# Patient Record
Sex: Female | Born: 1965 | Race: Black or African American | Hispanic: No | Marital: Married | Smoking: Never smoker
Health system: Southern US, Community
[De-identification: ages and names within clinical notes are randomized; demographics above are authoritative.]

## PROBLEM LIST (undated history)

## (undated) DIAGNOSIS — I2510 Atherosclerotic heart disease of native coronary artery without angina pectoris: Secondary | ICD-10-CM

## (undated) DIAGNOSIS — F419 Anxiety disorder, unspecified: Secondary | ICD-10-CM

## (undated) DIAGNOSIS — I10 Essential (primary) hypertension: Secondary | ICD-10-CM

## (undated) DIAGNOSIS — E785 Hyperlipidemia, unspecified: Secondary | ICD-10-CM

## (undated) HISTORY — DX: Anxiety disorder, unspecified: F41.9

## (undated) HISTORY — PX: ENDOMETRIAL ABLATION W/ NOVASURE: SUR434

## (undated) HISTORY — PX: TONSILLECTOMY: SUR1361

## (undated) HISTORY — PX: ABDOMINAL HYSTERECTOMY: SHX81

## (undated) HISTORY — DX: Atherosclerotic heart disease of native coronary artery without angina pectoris: I25.10

## (undated) HISTORY — PX: TUBAL LIGATION: SHX77

## (undated) HISTORY — PX: DIAGNOSTIC LAPAROSCOPY: SUR761

## (undated) HISTORY — DX: Hyperlipidemia, unspecified: E78.5

---

## 1998-03-15 ENCOUNTER — Inpatient Hospital Stay (HOSPITAL_COMMUNITY): Admission: AD | Admit: 1998-03-15 | Discharge: 1998-03-17 | Payer: Self-pay | Admitting: Obstetrics and Gynecology

## 1999-05-18 ENCOUNTER — Other Ambulatory Visit: Admission: RE | Admit: 1999-05-18 | Discharge: 1999-05-18 | Payer: Self-pay | Admitting: Obstetrics and Gynecology

## 2000-07-08 ENCOUNTER — Other Ambulatory Visit: Admission: RE | Admit: 2000-07-08 | Discharge: 2000-07-08 | Payer: Self-pay | Admitting: Obstetrics and Gynecology

## 2001-08-26 ENCOUNTER — Other Ambulatory Visit: Admission: RE | Admit: 2001-08-26 | Discharge: 2001-08-26 | Payer: Self-pay | Admitting: Obstetrics and Gynecology

## 2002-09-08 ENCOUNTER — Other Ambulatory Visit: Admission: RE | Admit: 2002-09-08 | Discharge: 2002-09-08 | Payer: Self-pay | Admitting: Obstetrics and Gynecology

## 2003-12-21 ENCOUNTER — Other Ambulatory Visit: Admission: RE | Admit: 2003-12-21 | Discharge: 2003-12-21 | Payer: Self-pay | Admitting: Obstetrics and Gynecology

## 2004-12-03 HISTORY — PX: COLONOSCOPY: SHX174

## 2004-12-26 ENCOUNTER — Other Ambulatory Visit: Admission: RE | Admit: 2004-12-26 | Discharge: 2004-12-26 | Payer: Self-pay | Admitting: Obstetrics and Gynecology

## 2005-08-10 ENCOUNTER — Ambulatory Visit: Payer: Self-pay | Admitting: Gastroenterology

## 2005-08-28 ENCOUNTER — Ambulatory Visit: Payer: Self-pay | Admitting: Gastroenterology

## 2006-01-25 ENCOUNTER — Other Ambulatory Visit: Admission: RE | Admit: 2006-01-25 | Discharge: 2006-01-25 | Payer: Self-pay | Admitting: Obstetrics and Gynecology

## 2006-03-04 ENCOUNTER — Ambulatory Visit (HOSPITAL_COMMUNITY): Admission: RE | Admit: 2006-03-04 | Discharge: 2006-03-04 | Payer: Self-pay | Admitting: Obstetrics and Gynecology

## 2007-04-09 ENCOUNTER — Ambulatory Visit (HOSPITAL_COMMUNITY): Admission: RE | Admit: 2007-04-09 | Discharge: 2007-04-09 | Payer: Self-pay | Admitting: Obstetrics and Gynecology

## 2007-04-09 ENCOUNTER — Encounter (INDEPENDENT_AMBULATORY_CARE_PROVIDER_SITE_OTHER): Payer: Self-pay | Admitting: Specialist

## 2009-05-26 ENCOUNTER — Encounter: Admission: RE | Admit: 2009-05-26 | Discharge: 2009-05-26 | Payer: Self-pay | Admitting: Obstetrics and Gynecology

## 2010-12-25 ENCOUNTER — Encounter: Payer: Self-pay | Admitting: Obstetrics and Gynecology

## 2011-04-20 NOTE — Op Note (Signed)
NAMEJAKIYAH, STEPNEY                 ACCOUNT NO.:  [id number]   MEDICAL RECORD NO.:  [id number]          PATIENT TYPE:  AMB   LOCATION:  SDC                           FACILITY:  [facility]   PHYSICIAN:  [name], M.D.   DATE OF BIRTH:  03/22/1966   DATE OF PROCEDURE:  03/04/2006  DATE OF DISCHARGE:  03/04/2006                                 OPERATIVE REPORT   PREOPERATIVE DIAGNOSIS:  Pelvic pain.   POSTOPERATIVE DIAGNOSIS:  Pelvic pain.   OPERATIVE PROCEDURE:  Diagnostic laparoscopy.   SURGEON:  [name], M.D.   ANESTHESIA:  General.   ESTIMATED BLOOD LOSS:  Minimal.   PACKS AND DRAINS:  None.   INTRAOPERATIVE BLOOD REPLACED:  None.   COMPLICATIONS:  None.   INDICATIONS:  Dictated in the history and physical.   PROCEDURE:  The patient was taken to the OR and placed in the supine  position.  After a satisfactory level of general anesthesia was obtained,  the patient was placed in the dorsal lithotomy position using the Allen  stirrups.  The abdomen, perineum and vagina were prepped out with Betadine.  The bladder was emptied by in-and-out catheterization.  A Hulka tenaculum  was put in place and secured.  The patient was draped as a sterile field.  A  subumbilical incision was made with a knife, carried through the  subcutaneous tissue.  The fascia was entered sharply and the incision in the  fascia extended laterally.  The peritoneum was entered bluntly and a Taut  open laparoscopic trocar was put in place and secured.  The laparoscope was  introduced and there was no evidence of injury to adjacent organs.  A 5 mm  trocar was put in place under direct visualization.  Visualization revealed  the uterus to be upper limits of normal size and irregular consistent with  known fibroids.  A bilateral tubal ligation was noted.  Ovaries were  unremarkable.  The appendix was visualized and noted to be normal.  The  upper abdomen including the liver and tip of the gallbladder  was clear.  The  cul-de-sac and both lateral gutters were clear.  There was no evidence of  exact cause for the pain unless we are dealing with uterine adenomyosis or  from the fibroid.  At this point in time the abdomen was deflated of its  carbon dioxide, all trocars removed.  The subumbilical fascia closed with  two figure-of-eights of 0 Vicryl, skin with interrupted subcutiuclars of 4-0  Vicryl, the suprapubic  incision closed with Dermabond and the Hulka tenaculum was then removed.  The patient was taken out of the dorsal lithotomy position, once alert and  extubated transferred to the recovery room in good condition.  Sponge,  instrument and needle count reported as correct by the circulating nurse x2.      [name], M.D.  Electronically Signed     JSM/MEDQ  D:  03/04/2006  T:  03/06/2006  Job:  [id number]

## 2011-04-20 NOTE — H&P (Signed)
NAME:  Nancy [name], MUCHA NO.:  [id number]   MEDICAL RECORD NO.:  [id number]          PATIENT TYPE:  AMB   LOCATION:  SDC                           [facility]   PHYSICIAN:  [name], M.D.   DATE OF BIRTH:  03-Apr-1966   DATE OF ADMISSION:  04/09/2007  DATE OF DISCHARGE:                              HISTORY & PHYSICAL   The patient is a 45 year old gravida 2, para 2 married black female who  presents for hysteroscopy, endometrial ablation.   In relation to the present admission the patient's cycles have become  increasingly heavy.  She has three days of markedly heavy flow changing  pads and tampons every 1-2 hours with clots; at times incapacitating.  Hemoglobin has remained stable.  We did a saline infusion ultrasound; it  was unremarkable and highly suggestive of adenomyosis.  We discussed  options including birth control pills, Marine IUD, ablation or  hysterectomy.  The patient now presents for NovaSure ablation.   ALLERGIES:  No known drug allergies.   MEDICATIONS:  None.   PAST MEDICAL HISTORY:  Usual childhood diseases.  No significant  sequelae.   PAST SURGICAL HISTORY:  She had a TAB in 1986, tonsillectomy at age 42,  previous postpartum tubal, previous diagnostic laparoscopy for pelvic  pain.   OBSTETRICAL HISTORY:  Two vaginal deliveries.   FAMILY HISTORY:  Noncontributory.   SOCIAL HISTORY:  There is no tobacco, alcohol use.   REVIEW OF SYSTEMS:  Noncontributory.   PHYSICAL EXAMINATION:  VITAL SIGNS:  Afebrile, stable vital signs.  HEENT: The patient is normocephalic.  Pupils equal, round, and reactive  to light and accommodation.  Extraocular movements were intact.  Sclerae  and conjunctivae are clear.  Oropharynx clear.  NECK:  Without thyromegaly.  BREASTS:  No discrete masses.  LUNGS:  Clear.  CARDIOVASCULAR:  Regular rhythm and rate without murmurs or gallops.  ABDOMEN:  Benign.  No mass, organomegaly or tenderness.  PELVIC:   Normal external genitalia.  Vaginal mucosa is clear.  Cervix  unremarkable.  Uterus normal size, shape, and contour.  Adnexa free of  masses or tenderness.  EXTREMITIES:  Trace edema.  NEUROLOGIC:  Grossly within normal limits.   IMPRESSION:  Menorrhagia secondary to adenomyosis.   PLAN:  The patient to undergo hysteroscopy and NovaSure ablation.  Risks  have been discussed including the risk of infection, risk of vascular  injury that could lead to hemorrhage requiring transfusion with the risk  of AIDS or hepatitis.  Continued bleeding could require hysterectomy.  There is a risk of perforation that could lead to injury to adjacent  organs including bowel that could require further exploratory surgery,  risk of deep venous thrombosis, and pulmonary embolus.  Success rates of  approximately 80% are quoted.  Amenorrhea rates of approximately 40% are  quoted.  The patient expresses understanding of indications, risks, and  alternatives.      [name], M.D.  Electronically Signed     JSM/MEDQ  D:  04/09/2007  T:  04/09/2007  Job:  [id number]

## 2011-04-20 NOTE — Op Note (Signed)
Nancy [name], LANDRY NO.:  [id number]   MEDICAL RECORD NO.:  [id number]          PATIENT TYPE:  AMB   LOCATION:  SDC                           [facility]   PHYSICIAN:  [name], M.D.   DATE OF BIRTH:  Apr 15, 1966   DATE OF PROCEDURE:  04/09/2007  DATE OF DISCHARGE:                               OPERATIVE REPORT   PREOPERATIVE DIAGNOSIS:  Menorrhagia.   POSTOPERATIVE DIAGNOSIS:  Menorrhagia.   OPERATIVE PROCEDURES:  1. Paracervical block.  2. Hysteroscopy.  3. NovaSure ablation of the endometrium.   SURGEON:  [name], M.D.   ANESTHESIA:  General with paracervical block.   ESTIMATED BLOOD LOSS:  Minimal.   PACKS AND DRAINS:  None.   INTRAOPERATIVE BLOOD REPLACED:  None.   COMPLICATIONS:  None.   INDICATIONS:  Dictated in the history and physical.   PROCEDURE:  The patient was taken to the OR and placed in supine  position.  After a satisfactory level of general endotracheal anesthesia  obtained, the patient was placed in the dorsal position using the Allen  stirrups.  Perineum and vagina cleansed out with Betadine and draped as  a sterile field.  A speculum was placed in the vaginal vault.  The  cervix was grasped with a single-tooth tenaculum.  The uterus sounded to  8 cm.  Endocervical length was 3 cm.  Paracervical block of 1% Nesacaine  was then instituted.  The hysteroscope was then introduced, intrauterine  cavity was distended.  Visualization revealed a normal endometrial  cavity.  There was no evidence of polyps or abnormalities.  Endometrial  biopsy was obtained, sent for pathological review.  NovaSure was then  brought into place and properly deployed.  Her total cavity with was  3.1.  We then passed the carbon dioxide patency test and proceeded with  the ablation.  We ablated with a power of 85 for 105 seconds.  The  NovaSure was then removed intact.  We re-hysteroscoped her.  Endometrium  was totally ablated.  There was  no evidence of perforation or other  complications.  Hysteroscope and single-tooth tenaculum were removed,  the patient taken out of the dorsal lithotomy position, once alert and  extubated transferred to the recovery room in good condition.  Sponge,  instrument and needle count reported as correct by the circulating  nurse.      [name], M.D.  Electronically Signed     JSM/MEDQ  D:  04/09/2007  T:  04/09/2007  Job:  [id number]

## 2011-04-20 NOTE — H&P (Signed)
NAME:  Nancy [name], Nancy [name] NO.:  [id number]   MEDICAL RECORD NO.:  [id number]          PATIENT TYPE:  AMB   LOCATION:  SDC                           [facility]   PHYSICIAN:  [name], M.D.   DATE OF BIRTH:  1966/11/22   DATE OF ADMISSION:  DATE OF DISCHARGE:                                HISTORY & PHYSICAL   HISTORY OF PRESENT ILLNESS:  The patient is a 45 year old gravida 2, para 2  female who presents for diagnostic laparoscopy with laser standby.   In relation to the present admission, cycles are regular.  She has issues of  left lower quadrant pain with her cycles. The pain shoots down through her  rectum.  There is no pain with intercourse.  She has no change in bladder or  bowel habits.  Ultrasound has basically been unremarkable.  She has a 4.4 cm  fibroid with area of degeneration.  Endometrium and ovaries, otherwise,  unremarkable.  We have discussed options.  It could be that we are dealing  with endometriosis.  The pain has caused increasing pain and discomfort.  In  view of this, will proceed with diagnostic laparoscopy with laser standby.   ALLERGIES:  No known drug allergies.   MEDICATIONS:  Ultram.   PAST MEDICAL HISTORY:  No childhood diseases.   PAST SURGICAL HISTORY:  1.  She had a TAB in 1986.  2.  Tonsillectomy at age 74.  3.  Postpartum bilateral tubal ligation.   OBSTETRIC HISTORY:  Two vaginal deliveries.   FAMILY HISTORY:  Noncontributory.   SOCIAL HISTORY:  No tobacco or alcohol use.   REVIEW OF SYSTEMS:  Noncontributory.   PHYSICAL EXAMINATION:  GENERAL APPEARANCE:  The patient is afebrile, stable  vital signs.  HEENT:  The patient is normocephalic.  Pupils equal, round and reactive to  light and accommodation.  Extraocular movements intact.  Sclerae and  conjunctivae clear.  Oropharynx clear.  NECK:  Without thyromegaly.  BREASTS:  No discrete masses.  LUNGS:  Clear.  CARDIOVASCULAR:  Regular rate.  No murmurs or  gallops.  ABDOMEN:  Benign.  No masses, organomegaly or tenderness.  PELVIC:  Normal external genitalia.  Vaginal mucosa is clear.  Cervix is  unremarkable.  Uterus normal size, shape and contour.  Adnexa free of masses  or tenderness.  EXTREMITIES:  Trace edema.  NEUROLOGICAL:  Grossly within normal limits. 4   IMPRESSION:  Chronic pelvic pain, rule out endometriosis.   PLAN:  The patient will undergo diagnostic laparoscopy with laser standby.  Risks have been discussed including the risk of infection.  Risk of  hemorrhage could require transfusion, risk of AIDS or hepatitis, risk or  injury to adjacent organs including bladder, bowel or ureters that could  require further exploratory surgery, risk of deep vein thrombosis and  pulmonary embolus.  The patient expressed understanding of indications,  risks and other alternatives.      [name], M.D.  Electronically Signed     JSM/MEDQ  D:  03/03/2006  T:  03/04/2006  Job:  [id number]

## 2012-05-22 ENCOUNTER — Other Ambulatory Visit: Payer: Self-pay | Admitting: Physician Assistant

## 2012-07-30 ENCOUNTER — Ambulatory Visit (INDEPENDENT_AMBULATORY_CARE_PROVIDER_SITE_OTHER): Payer: 59 | Admitting: Family Medicine

## 2012-07-30 VITALS — BP 138/82 | HR 74 | Temp 98.8°F | Resp 20 | Ht 63.0 in | Wt 125.0 lb

## 2012-07-30 DIAGNOSIS — F329 Major depressive disorder, single episode, unspecified: Secondary | ICD-10-CM

## 2012-07-30 DIAGNOSIS — R079 Chest pain, unspecified: Secondary | ICD-10-CM

## 2012-07-30 DIAGNOSIS — R200 Anesthesia of skin: Secondary | ICD-10-CM

## 2012-07-30 DIAGNOSIS — E785 Hyperlipidemia, unspecified: Secondary | ICD-10-CM

## 2012-07-30 DIAGNOSIS — R209 Unspecified disturbances of skin sensation: Secondary | ICD-10-CM

## 2012-07-30 NOTE — Progress Notes (Signed)
[facility] [address] [place] [address] [phone]  Date:  07/30/2012   Name:  Nancy [name]   DOB:  1966-09-09   MRN:  [id number]  PCP:  No primary provider on file.    Chief Complaint: Chest Pain, Shortness of Breath and Numbness   History of Present Illness:  Nancy [name] is a 46 y.o. very pleasant female patient who presents with the following:  Here to evaluate SOB, CP and tingling in her left arm.  She was here in November of 2012 with complaint of SOB, chest discomfort that was associated with bronchitis. She was also started on zoloft at that time.   Here today with "chest discomfort" which she notes with walking, dancing, climbing stairs.  She will feel "as if I just ran a mile" after light exercise.  Will feel SOB/ chest tightness/ out of breath.  This has gone on for at least 2 months- per chart maybe longer. She had a similar issue in January of 2012.  She feels as through it is getting worse. The same amount of exertion will cause her symptoms consistently.   She also noted tingling in her left arm and hand "all the time." She has noticed this for several weeks.  "Popping" the joints in her hand seems to help.  No weakness or symptoms into her fingers.   She has never had a stress test or other cardiac evaluation.  She is active and exercises regularly Never a smoker.   Her father had CABG and other complications, he had a stroke recently.  Paternal uncle died of MI, other paternal siblings with heart disease.    She did have a FLP done per her OB this spring and was told it was borderline.    History of BTL- she is on OCP, LMP 07/16/12.  No symptoms currently.  Does not have symptoms at rest.    Patient Active Problem List  Diagnosis  . Hyperlipidemia  . Depression    No past medical history on file.  No past surgical history on file.  History  Substance Use Topics  . Smoking status: Never Smoker   . Smokeless tobacco: Not on file    . Alcohol Use: Not on file    No family history on file.  No Known Allergies  Medication list has been reviewed and updated.  Current Outpatient Prescriptions on File Prior to Visit  Medication Sig Dispense Refill  . norgestrel-ethinyl estradiol (LO/OVRAL,CRYSELLE) 0.3-30 MG-MCG tablet Take 1 tablet by mouth daily.      . citalopram (CELEXA) 20 MG tablet TAKE 1 TABLET BY MOUTH DAILY  90 tablet  0    Review of Systems:  As per HPI- otherwise negative.   Physical Examination: Filed Vitals:   07/30/12 0934  BP: 138/82  Pulse: 74  Temp: 98.8 F (37.1 C)  Resp: 20   Filed Vitals:   07/30/12 0934  Height: 5\' 3"  (1.6 m)  Weight: 125 lb (56.7 kg)   Body mass index is 22.14 kg/(m^2). Ideal Body Weight: Weight in (lb) to have BMI = 25: 140.8   GEN: WDWN, NAD, Non-toxic, A & O x 3, slim build, looks well HEENT: Atraumatic, Normocephalic. Neck supple. No masses, No LAD.  TM and oropharynx wnl, PEERL, EOMI.   Ears and Nose: No external deformity. CV: RRR, No M/G/R. No JVD. No thrill. No extra heart sounds. PULM: CTA B, no wheezes, crackles, rhonchi. No retractions. No resp. distress.  No accessory muscle use. ABD: S, NT, ND, no HSM.  EXTR: No c/c/e NEURO Normal gait.  PSYCH: Normally interactive. Conversant. Not depressed or anxious appearing.  Calm demeanor.  No chest wall tenderness or swelling.  UE with normal strength and circulation  EKG: normal sinus rhythm. No ST elevation or depression.  Compared to EKG 12/2010- no change  Assessment and Plan: 1. Chest pain  EKG 12-Lead, Ambulatory referral to Cardiology  2. Hyperlipidemia    3. Depression     Nancy [name] has chest discomfort symptoms which may indicate stable angina for the last several months or longer. Symptoms may also be due to anxiety. Will have her start a baby aspirin daily and refer to cardiology.  Will also ask her to get [place] a copy of her recent cholesterol panel- may want to start a statin.  If her symptoms  change or worsen in the meantime please let me know.  Also will ask her to take her cholesterol results to her heart doctor appt,    Gave a velcro wrist splint to try for her left wrist at night- she may be having CTS symptoms.    Abbe Amsterdam, MD

## 2012-07-30 NOTE — Patient Instructions (Addendum)
I will refer you to cardiology- in the meantime let me know if anything changes and take a baby aspirin daily.   Try the wrist splint especially at night

## 2012-07-31 ENCOUNTER — Encounter: Payer: Self-pay | Admitting: Cardiovascular Disease

## 2012-07-31 ENCOUNTER — Ambulatory Visit (INDEPENDENT_AMBULATORY_CARE_PROVIDER_SITE_OTHER): Payer: 59 | Admitting: Cardiovascular Disease

## 2012-07-31 ENCOUNTER — Ambulatory Visit (INDEPENDENT_AMBULATORY_CARE_PROVIDER_SITE_OTHER): Payer: 59 | Admitting: Family Medicine

## 2012-07-31 VITALS — BP 144/93 | HR 83 | Ht 63.0 in | Wt 125.5 lb

## 2012-07-31 VITALS — BP 132/79 | HR 80 | Temp 98.0°F | Resp 16 | Ht 63.5 in | Wt 127.0 lb

## 2012-07-31 DIAGNOSIS — Z566 Other physical and mental strain related to work: Secondary | ICD-10-CM

## 2012-07-31 DIAGNOSIS — R5381 Other malaise: Secondary | ICD-10-CM

## 2012-07-31 DIAGNOSIS — R0602 Shortness of breath: Secondary | ICD-10-CM

## 2012-07-31 DIAGNOSIS — R0789 Other chest pain: Secondary | ICD-10-CM

## 2012-07-31 DIAGNOSIS — R0600 Dyspnea, unspecified: Secondary | ICD-10-CM

## 2012-07-31 DIAGNOSIS — F411 Generalized anxiety disorder: Secondary | ICD-10-CM

## 2012-07-31 DIAGNOSIS — Z569 Unspecified problems related to employment: Secondary | ICD-10-CM

## 2012-07-31 DIAGNOSIS — R5383 Other fatigue: Secondary | ICD-10-CM

## 2012-07-31 DIAGNOSIS — E785 Hyperlipidemia, unspecified: Secondary | ICD-10-CM

## 2012-07-31 MED ORDER — ALPRAZOLAM 0.25 MG PO TABS: ORAL_TABLET | ORAL | Status: DC

## 2012-07-31 NOTE — Assessment & Plan Note (Addendum)
Nancy [name] presents with some dyspnea with exertion. She walks on regular basis and typically walks 1 mile a day. She has mildly elevated blood pressure today and reports that she uses a fair amount of salt. She makes no effort to avoid salt.  I suspect that her dyspnea is coming from her high blood pressure. Her blood pressure would normally go up with exercise and I suspect that she's having exercise-induced hypertension as an explanation for her dyspnea.  I've asked her to avoid eating any exercise and to make a specific effort to not get salty food. I've asked her to increase her walking to 1-2 miles a day.  We'll get an echocardiogram to evaluate her cardiac structure. I will see her again in 3 months. We'll get a basic metabolic profile, TSH, lipid profile, and liver profile at that time.

## 2012-07-31 NOTE — Patient Instructions (Signed)
Anxiety and Panic Attacks Your caregiver has informed you that you are having an anxiety or panic attack. There may be many forms of this. Most of the time these attacks come suddenly and without warning. They come at any time of day, including periods of sleep, and at any time of life. They may be strong and unexplained. Although panic attacks are very scary, they are physically harmless. Sometimes the cause of your anxiety is not known. Anxiety is a protective mechanism of the body in its fight or flight mechanism. Most of these perceived danger situations are actually nonphysical situations (such as anxiety over losing a job). CAUSES  The causes of an anxiety or panic attack are many. Panic attacks may occur in otherwise healthy people given a certain set of circumstances. There may be a genetic cause for panic attacks. Some medications may also have anxiety as a side effect. SYMPTOMS  Some of the most common feelings are:  Intense terror.   Dizziness, feeling faint.   Hot and cold flashes.   Fear of going crazy.   Feelings that nothing is real.   Sweating.   Shaking.   Chest pain or a fast heartbeat (palpitations).   Smothering, choking sensations.   Feelings of impending doom and that death is near.   Tingling of extremities, this may be from over-breathing.   Altered reality (derealization).   Being detached from yourself (depersonalization).  Several symptoms can be present to make up anxiety or panic attacks. DIAGNOSIS  The evaluation by your caregiver will depend on the type of symptoms you are experiencing. The diagnosis of anxiety or panic attack is made when no physical illness can be determined to be a cause of the symptoms. TREATMENT  Treatment to prevent anxiety and panic attacks may include:  Avoidance of circumstances that cause anxiety.   Reassurance and relaxation.   Regular exercise.   Relaxation therapies, such as yoga.   Psychotherapy with a  psychiatrist or therapist.   Avoidance of caffeine, alcohol and illegal drugs.   Prescribed medication.  SEEK IMMEDIATE MEDICAL CARE IF:   You experience panic attack symptoms that are different than your usual symptoms.   You have any worsening or concerning symptoms.  Document Released: 11/19/2005 Document Revised: 11/08/2011 Document Reviewed: 03/23/2010 [facility] Patient Information 2012 [place], [place].    Insomnia Insomnia means you have trouble falling or staying asleep. It affects about one person in three at different times and is usually related to stress from work, school, or personal relations. Insomnia is also a sign of depression or anxiety. Other medical problems that cause insomnia include conditions that cause pain, night leg cramps, coughing, shortness of breath, urinary problems, and fevers. Sleep apnea is an abnormal breathing pattern at night that can cause insomnia and loud snoring. Certain medications and excess intake of caffeine drinks (coffee, tea, colas) can also interfere with normal sleep. Treatment for insomnia depends on the cause. Besides specific medical treatment, the following measures can help you relax and get better sleep. Get regular exercise every day, at least several hours before bed time. Try to get to bed at the same time every night. Take a hot bath before retiring to help you relax. Do not stay in bed if you are unable to sleep. During the daytime avoid staying in bed to watch television, eat, or read. Reduce unwanted noise and light in your room. Keep your room at a comfortable temperature. Avoid alcohol as it causes one to sleep less soundly,  may cause you to awaken during the night, and can leave you feeling groggy the next day. Using a mild sedative prescribed or suggested by your caregiver may be needed, but the daily use of sleeping pills is not recommended. Anti-depressant medicines can improve sleep in people with depression. Please call your  doctor for follow up care to better understand the cause and proper treatment of your insomnia. Document Released: 12/27/2004 Document Revised: 11/08/2011 Document Reviewed: 11/19/2005 [facility] Patient Information 2012 [place], [place].

## 2012-07-31 NOTE — Progress Notes (Signed)
    [name] Date of Birth  Jul 06, 1966       [facility]    [place] [address], [address]  [address] [place], [place]  [address]   [place], [place]  [address] [phone]     [phone]   Fax  [phone]    Fax [phone]  Problem List: 1. Chest pain   History of Present Illness:  Ritha is a 46 yo lady who presents with exertional dyspnea and palpitations ( pounding).  She denies any sharp pain.  She walks on a regular basis - about 1 mile a day.  If she walks at a regular pace, she is OK  However, if she walks quickly or does line dancing, she becomes very dyspneic.  She still eats extra salt. She eat out frequently and really makes no effort to avoid salt.   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  Current Outpatient Prescriptions on File Prior to Visit  Medication Sig Dispense Refill  . norgestrel-ethinyl estradiol (LO/OVRAL,CRYSELLE) 0.3-30 MG-MCG tablet Take 1 tablet by mouth daily.        No Known Allergies  History reviewed. No pertinent past medical history.  Past Surgical History  Procedure Date  . Tubal ligation     History  Smoking status  . Never Smoker   Smokeless tobacco  . Not on file    History  Alcohol Use  . 0.6 oz/week  . 1 Glasses of wine per week    Family History  Problem Relation Age of Onset  . Heart disease Father   . Hypertension Father   . Heart disease Paternal Uncle   . Heart attack Paternal Uncle     Reviw of Systems:  Reviewed in the HPI.  All other systems are negative.  Physical Exam: Blood pressure 144/93, pulse 83, height 5\' 3"  (1.6 m), weight 125 lb 8 oz (56.926 kg), last menstrual period 07/16/2012. General: Well developed, well nourished, in no acute distress.  Head: Normocephalic, atraumatic, sclera non-icteric, mucus membranes are moist,   Neck: Supple. Carotids are 2 + without bruits. No JVD  Lungs: Clear bilaterally to  auscultation.  Heart: regular rate.  normal  S1 S2. She has a soft systolic outflow murmur.    Abdomen: Soft, non-tender, non-distended with normal bowel sounds. No hepatomegaly. No rebound/guarding. No masses.  Msk:  Strength and tone are normal  Extremities: No clubbing or cyanosis. No edema.  Distal pedal pulses are 2+ and equal bilaterally.  Neuro: Alert and oriented X 3. Moves all extremities spontaneously.  Psych:  Responds to questions appropriately with a normal affect.  ECG: July 31, 2012.  NSR at 72. Normal ECG.  Assessment / Plan:

## 2012-07-31 NOTE — Patient Instructions (Addendum)
Your physician has requested that you have an echocardiogram. Echocardiography is a painless test that uses sound waves to create images of your heart. It provides your doctor with information about the size and shape of your heart and how well your heart's chambers and valves are working. This procedure takes approximately one hour. There are no restrictions for this procedure.  Patient should follow up with Dr. Elease Hashimoto in 3 months in [place]. Need to have liver/lipid/bmet & TSH at 3 month office visit. Need to reduce salt intake. Walk everyday. ExitCare Patient Information 2012 [employer].2 Gram Low Sodium Diet A 2 gram sodium diet restricts the amount of sodium in the diet to no more than 2 g or 2000 mg daily. Limiting the amount of sodium is often used to help lower blood pressure. It is important if you have heart, liver, or kidney problems. Many foods contain sodium for flavor and sometimes as a preservative. When the amount of sodium in a diet needs to be low, it is important to know what to look for when choosing foods and drinks. The following includes some information and guidelines to help make it easier for you to adapt to a low sodium diet. QUICK TIPS  Do not add salt to food.   Avoid convenience items and fast food.   Choose unsalted snack foods.   Buy lower sodium products, often labeled as "lower sodium" or "no salt added."   Check food labels to learn how much sodium is in 1 serving.   When eating at a restaurant, ask that your food be prepared with less salt or none, if possible.  READING FOOD LABELS FOR SODIUM INFORMATION The nutrition facts label is a good place to find how much sodium is in foods. Look for products with no more than 500 to 600 mg of sodium per meal and no more than 150 mg per serving. Remember that 2 g = 2000 mg. The food label may also list foods as:  Sodium-free: Less than 5 mg in a serving.   Very low sodium: 35 mg or less in a serving.    Low-sodium: 140 mg or less in a serving.   Light in sodium: 50% less sodium in a serving. For example, if a food that usually has 300 mg of sodium is changed to become light in sodium, it will have 150 mg of sodium.   Reduced sodium: 25% less sodium in a serving. For example, if a food that usually has 400 mg of sodium is changed to reduced sodium, it will have 300 mg of sodium.  CHOOSING FOODS Grains  Avoid: Salted crackers and snack items. Some cereals, including instant hot cereals. Bread stuffing and biscuit mixes. Seasoned rice or pasta mixes.   Choose: Unsalted snack items. Low-sodium cereals, oats, puffed wheat and rice, shredded wheat. English muffins and bread. Pasta.  Meats  Avoid: Salted, canned, smoked, spiced, pickled meats, including fish and poultry. Bacon, ham, sausage, cold cuts, hot dogs, anchovies.   Choose: Low-sodium canned tuna and salmon. Fresh or frozen meat, poultry, and fish.  Dairy  Avoid: Processed cheese and spreads. Cottage cheese. Buttermilk and condensed milk. Regular cheese.   Choose: Milk. Low-sodium cottage cheese. Yogurt. Sour cream. Low-sodium cheese.  Fruits and Vegetables  Avoid: Regular canned vegetables. Regular canned tomato sauce and paste. Frozen vegetables in sauces. Olives. Rosita Fire. Relishes. Sauerkraut.   Choose: Low-sodium canned vegetables. Low-sodium tomato sauce and paste. Frozen or fresh vegetables. Fresh and frozen fruit.  Condiments  Avoid: Canned and packaged gravies. Worcestershire sauce. Tartar sauce. Barbecue sauce. Soy sauce. Steak sauce. Ketchup. Onion, garlic, and table salt. Meat flavorings and tenderizers.   Choose: Fresh and dried herbs and spices. Low-sodium varieties of mustard and ketchup. Lemon juice. Tabasco sauce. Horseradish.  SAMPLE 2 GRAM SODIUM MEAL PLAN Breakfast / Sodium (mg)  1 cup low-fat milk / 143 mg   2 slices whole-wheat toast / 270 mg   1 tbs heart-healthy margarine / 153 mg   1 hard-boiled  egg / 139 mg   1 small orange / 0 mg  Lunch / Sodium (mg)  1 cup raw carrots / 76 mg    cup hummus / 298 mg   1 cup low-fat milk / 143 mg    cup red grapes / 2 mg   1 whole-wheat pita bread / 356 mg  Dinner / Sodium (mg)  1 cup whole-wheat pasta / 2 mg   1 cup low-sodium tomato sauce / 73 mg   3 oz lean ground beef / 57 mg   1 small side salad (1 cup raw spinach leaves,  cup cucumber,  cup yellow bell pepper) with 1 tsp olive oil and 1 tsp red wine vinegar / 25 mg  Snack / Sodium (mg)  1 container low-fat vanilla yogurt / 107 mg   3 graham cracker squares / 127 mg  Nutrient Analysis  Calories: 2033   Protein: 77 g   Carbohydrate: 282 g   Fat: 72 g   Sodium: 1971 mg  Document Released: 11/19/2005 Document Revised: 11/08/2011 Document Reviewed: 02/20/2010 [facility] Patient Information 2012 [place].

## 2012-08-01 ENCOUNTER — Encounter: Payer: Self-pay | Admitting: Family Medicine

## 2012-08-01 NOTE — Progress Notes (Signed)
S: This 46 y.o. AA female is here for evaluation and treatment of anxiety; it is situational as she gets nervous when she has court appearances ( she works with the foster care system). She has appearances where she has to testify less than once a week. She also has some mild insomnia where she she can "shut off her brain". She has no trouble falling asleep but often wakes up in early Am and can not get back to sleep. She practices good nutrition and avoids excess caffeine. 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 Pt had been prescribed Celexa in the past but she did not like the way it made her feel.  Re: chest pain/ HTN evaluation- she saw the Cardiologist who advised salt elimination and regular fitness. She is scheduled to follow-up within next few months.  O:  Filed Vitals:   07/31/12 1331  BP: 132/79                                    BMI=22  Pulse: 80  Temp: 98 F (36.7 C)  Resp: 16   GEN: In NAD; WN,WD HNET: [place]/AT; EOMI, conj/scl clear LUNGS: Normal resp rate and effort COR: RRR NEURO: A&O x 3; CNs intact.otherwise nonfocal.   A/P: 1. Anxiety state, unspecified  RX: Alprazolam 0.25 mg  Take 1 tab before event when stress is anticipated and take 2 tabs hs as needed for sleep.  2. Stress at work

## 2012-08-26 ENCOUNTER — Other Ambulatory Visit (INDEPENDENT_AMBULATORY_CARE_PROVIDER_SITE_OTHER): Payer: 59

## 2012-08-26 ENCOUNTER — Other Ambulatory Visit: Payer: Self-pay

## 2012-08-26 DIAGNOSIS — R0602 Shortness of breath: Secondary | ICD-10-CM

## 2012-08-26 DIAGNOSIS — R0789 Other chest pain: Secondary | ICD-10-CM

## 2012-10-17 ENCOUNTER — Ambulatory Visit (INDEPENDENT_AMBULATORY_CARE_PROVIDER_SITE_OTHER): Payer: 59 | Admitting: Family Medicine

## 2012-10-17 VITALS — BP 118/78 | HR 82 | Temp 97.8°F | Resp 16 | Ht 63.0 in | Wt 132.0 lb

## 2012-10-17 DIAGNOSIS — H9209 Otalgia, unspecified ear: Secondary | ICD-10-CM

## 2012-10-17 DIAGNOSIS — M546 Pain in thoracic spine: Secondary | ICD-10-CM

## 2012-10-17 DIAGNOSIS — T148XXA Other injury of unspecified body region, initial encounter: Secondary | ICD-10-CM

## 2012-10-17 DIAGNOSIS — H9202 Otalgia, left ear: Secondary | ICD-10-CM

## 2012-10-17 MED ORDER — TRAMADOL HCL 50 MG PO TABS: 50.0000 mg | ORAL_TABLET | Freq: Three times a day (TID) | ORAL | Status: DC | PRN

## 2012-10-17 MED ORDER — HYDROCORTISONE-ACETIC ACID 1-2 % OT SOLN: 3.0000 [drp] | Freq: Three times a day (TID) | OTIC | Status: DC

## 2012-10-17 MED ORDER — CYCLOBENZAPRINE HCL 10 MG PO TABS: 10.0000 mg | ORAL_TABLET | Freq: Every evening | ORAL | Status: DC | PRN

## 2012-10-17 NOTE — Progress Notes (Signed)
 [facility]:  Office Visit  Chief Complaint:  Chief Complaint  Patient presents with  . Otalgia    constant pain in left ear started Sunday  . Back Pain    6 days- nothing is relieving the pain    HPI: Nancy [name] is a 46 y.o. female who complains of : Left ear pain x 5 days of left ear pain, tried otc ear drops without relief, chills off and on, but no URI/sinu sxs. Ear pain with drinking fluids. No fevers, cough. No HAs.  Left and mid low back pain x 1 week. NKI. Able to control bowel and bladder function. No numbness, tingling, weakness. Lifted a cooler with water but not heavy. [place] and most of the time. Sharp only with flecionexrension. Slouhc makes pain worse. No prior back injuires or surgeires. Ibuprofen 400 mg  does not work every 4 hours.   Past Medical History  Diagnosis Date  . Anxiety    Past Surgical History  Procedure Date  . Tubal ligation    History   Social History  . Marital Status: Married    Spouse Name: N/A    Number of Children: N/A  . Years of Education: N/A   Social History Main Topics  . Smoking status: Never Smoker   . Smokeless tobacco: None  . Alcohol Use: 0.6 oz/week    1 Glasses of wine per week  . Drug Use: No  . Sexually Active: Yes    Birth Control/ Protection: Other-see comments     Comment: pill   Other Topics Concern  . None   Social History Narrative  . None   Family History  Problem Relation Age of Onset  . Heart disease Father   . Hypertension Father   . Heart disease Paternal Uncle   . Heart attack Paternal Uncle    No Known Allergies Prior to Admission medications   Medication Sig Start Date End Date Taking? Authorizing Provider  ALPRAZolam Prudy Feeler) 0.25 MG tablet Take 1 tablet as needed prior to event; may take 2 tablets at bedtime as needed for sleep. 07/31/12  Yes Maurice March, MD  aspirin 81 MG tablet Take 81 mg by mouth daily.   Yes Historical Provider, MD  norgestrel-ethinyl  estradiol (LO/OVRAL,CRYSELLE) 0.3-30 MG-MCG tablet Take 1 tablet by mouth daily.   Yes Historical Provider, MD     ROS: The patient denies fevers, chills, night sweats, unintentional weight loss, chest pain, palpitations, wheezing, dyspnea on exertion, nausea, vomiting, abdominal pain, dysuria, hematuria, melena, numbness, weakness, or tingling.  All other systems have been reviewed and were otherwise negative with the exception of those mentioned in the HPI and as above.    PHYSICAL EXAM: Filed Vitals:   10/17/12 1213  BP: 118/78  Pulse: 82  Temp: 97.8 F (36.6 C)  Resp: 16   Filed Vitals:   10/17/12 1213  Height: 5\' 3"  (1.6 m)  Weight: 132 lb (59.875 kg)   Body mass index is 23.38 kg/(m^2).  General: Alert, no acute distress HEENT:  Normocephalic, atraumatic, oropharynx patent. TM normal Cardiovascular:  Regular rate and rhythm, no rubs murmurs or gallops.  No Carotid bruits, radial pulse intact. No pedal edema.  Respiratory: Clear to auscultation bilaterally.  No wheezes, rales, or rhonchi.  No cyanosis, no use of accessory musculature GI: No organomegaly, abdomen is soft and non-tender, positive bowel sounds.  No masses. Skin: No rashes. Neurologic: Facial musculature symmetric. Psychiatric: Patient is appropriate throughout our  interaction. Lymphatic: No cervical lymphadenopathy Musculoskeletal: Gait intact. Lumbar spine  ROM intact, no saddle anesthesia 5/5 strength DTRs nl.   LABS: No results found for this or any previous visit.   EKG/XRAY:   Primary read interpreted by Dr. Conley Rolls at [facility].   ASSESSMENT/PLAN: Encounter Diagnoses  Name Primary?  . Thoracic back pain Yes  . Otalgia of left ear   . Sprain and strain    Vosol HC for ear pain Flexeril and Tramadol for back sprain/strain. No need for xrays today since I think it is just muscular in etiology. F/u prn    ,  PHUONG, DO 10/18/2012 11:00 AM

## 2012-10-31 ENCOUNTER — Encounter: Payer: Self-pay | Admitting: *Deleted

## 2012-11-03 ENCOUNTER — Other Ambulatory Visit: Payer: Self-pay | Admitting: *Deleted

## 2012-11-03 ENCOUNTER — Ambulatory Visit (INDEPENDENT_AMBULATORY_CARE_PROVIDER_SITE_OTHER): Payer: 59 | Admitting: Cardiovascular Disease

## 2012-11-03 ENCOUNTER — Ambulatory Visit (INDEPENDENT_AMBULATORY_CARE_PROVIDER_SITE_OTHER): Payer: 59

## 2012-11-03 ENCOUNTER — Encounter: Payer: Self-pay | Admitting: Cardiovascular Disease

## 2012-11-03 VITALS — BP 144/90 | HR 90 | Ht 63.0 in | Wt 131.0 lb

## 2012-11-03 DIAGNOSIS — F329 Major depressive disorder, single episode, unspecified: Secondary | ICD-10-CM

## 2012-11-03 DIAGNOSIS — R5383 Other fatigue: Secondary | ICD-10-CM

## 2012-11-03 DIAGNOSIS — R5381 Other malaise: Secondary | ICD-10-CM

## 2012-11-03 DIAGNOSIS — R0789 Other chest pain: Secondary | ICD-10-CM

## 2012-11-03 DIAGNOSIS — I10 Essential (primary) hypertension: Secondary | ICD-10-CM | POA: Insufficient documentation

## 2012-11-03 DIAGNOSIS — R0602 Shortness of breath: Secondary | ICD-10-CM

## 2012-11-03 DIAGNOSIS — E785 Hyperlipidemia, unspecified: Secondary | ICD-10-CM

## 2012-11-03 MED ORDER — HYDROCHLOROTHIAZIDE 25 MG PO TABS: 25.0000 mg | ORAL_TABLET | Freq: Every day | ORAL | Status: DC

## 2012-11-03 MED ORDER — POTASSIUM CHLORIDE CRYS ER 10 MEQ PO TBCR: 10.0000 meq | EXTENDED_RELEASE_TABLET | Freq: Every day | ORAL | Status: DC

## 2012-11-03 NOTE — Progress Notes (Signed)
Nancy [name] Date of Birth  [birth year]       [facility]    [place] [address]  [address] [place], [place]  [address]   [place], [place]  [address] [phone]     [phone]   Fax  [phone]    Fax [phone]  Problem List: 1. Chest pain 2. Hypertension  History of Present Illness:  Nancy [name] is a 46 yo lady who presents with exertional dyspnea and palpitations ( pounding).  She denies any sharp pain.  She walks on a regular basis - about 1 mile a day.  If she walks at a regular pace, she is OK  However, if she walks quickly or does line dancing, she becomes very dyspneic.  She still eats extra salt. She eat out frequently and really makes no effort to avoid salt.   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  She was seen several months ago. She is on have mild hypertension. I asked her to greatly cut down on her salt intake. I've asked her to exercise on a more regular basis.  She's feeling quite a bit better. She has been watching her salt. She's been trying to exercise on regular basis but has been slowed down a little bit because of the recent cold weather.  Current Outpatient Prescriptions on File Prior to Visit  Medication Sig Dispense Refill  . acetic acid-hydrocortisone (VOSOL-HC) otic solution Place 3 drops into the left ear 3 (three) times daily.  10 mL  0  . ALPRAZolam (XANAX) 0.25 MG tablet Take 1 tablet as needed prior to event; may take 2 tablets at bedtime as needed for sleep.  30 tablet  1  . aspirin 81 MG tablet Take 81 mg by mouth daily.      . cyclobenzaprine (FLEXERIL) 10 MG tablet Take 1 tablet (10 mg total) by mouth at bedtime as needed for muscle spasms.  30 tablet  0  . norgestrel-ethinyl estradiol (LO/OVRAL,CRYSELLE) 0.3-30 MG-MCG tablet Take 1 tablet by mouth daily.      . traMADol (ULTRAM) 50 MG tablet Take 1 tablet (50 mg total) by mouth every 8 (eight) hours as needed  for pain.  30 tablet  0    No Known Allergies  Past Medical History  Diagnosis Date  . Anxiety     Past Surgical History  Procedure Date  . Tubal ligation     bilateral  . Tonsillectomy   . Diagnostic laparoscopy   . Endometrial ablation w/ novasure     History  Smoking status  . Never Smoker   Smokeless tobacco  . Not on file    History  Alcohol Use  . 0.6 oz/week  . 1 Glasses of wine per week    Family History  Problem Relation Age of Onset  . Coronary artery disease Father   . Hypertension Father   . Heart disease Paternal Uncle   . Heart attack Paternal Uncle   . Stroke Father   . Heart attack Father     Reviw of Systems:  Reviewed in the HPI.  All other systems are negative.  Physical Exam: Blood pressure 144/90, pulse 90, height 5\' 3"  (1.6 m), weight 131 lb (59.421 kg), last menstrual period 10/06/2012. General: Well developed, well nourished, in no acute distress.  Head: Normocephalic, atraumatic, sclera non-icteric, mucus membranes are moist,   Neck: Supple. Carotids are 2 + without  bruits. No JVD  Lungs: Clear bilaterally to auscultation.  Heart: regular rate.  normal  S1 S2. She has a soft systolic outflow murmur.    Abdomen: Soft, non-tender, non-distended with normal bowel sounds. No hepatomegaly. No rebound/guarding. No masses.  Msk:  Strength and tone are normal  Extremities: No clubbing or cyanosis. No edema.  Distal pedal pulses are 2+ and equal bilaterally.  Neuro: Alert and oriented X 3. Moves all extremities spontaneously.  Psych:  Responds to questions appropriately with a normal affect.  ECG: 11/03/2012-normal sinus rhythm at 90. Normal EKG.  Assessment / Plan:

## 2012-11-03 NOTE — Patient Instructions (Addendum)
Your physician recommends that you schedule a follow-up appointment in: 3 months   Your physician recommends that you return for lab work in: 1 month  Your physician has recommended you make the following change in your medication:   Start hctz- hydrochlorothiazide 25 mg each morning Start kdur/ potassium 10 meq daily with hctz

## 2012-11-03 NOTE — Assessment & Plan Note (Signed)
Nancy [name]  presents today with persistently elevated blood pressure. She's been watching her salt. She has been exercising fairly regularly.  We will start her on HCTZ 25 mg a day as well as potassium chloride 10 mEq a day. We will see her back in one month for a basic metabolic profile. I'll see her again in 3 months for office visit and basic metabolic profile.

## 2012-11-03 NOTE — Progress Notes (Signed)
Prescribing problem, resent order.

## 2012-11-03 NOTE — Assessment & Plan Note (Signed)
This is basically resolved. She's not having any further   episodes of dyspnea or chest discomfort.

## 2012-11-04 LAB — LIPID PANEL
Chol/HDL Ratio: 4.1 ratio units (ref 0.0–4.4)
HDL: 50 mg/dL (ref >39)
LDL Calculated: 145 mg/dL — ABNORMAL HIGH (ref 0–99)

## 2012-11-04 LAB — BASIC METABOLIC PANEL
CO2: 22 mmol/L (ref 19–28)
Calcium: 9.3 mg/dL (ref 8.7–10.2)
GFR calc non Af Amer: 99 mL/min/{1.73_m2} (ref >59)
Glucose: 92 mg/dL (ref 65–99)
Potassium: 4.5 mmol/L (ref 3.5–5.2)

## 2012-11-04 LAB — TSH: TSH: 1.24 u[IU]/mL (ref 0.450–4.500)

## 2012-11-07 ENCOUNTER — Other Ambulatory Visit: Payer: Self-pay

## 2012-11-07 ENCOUNTER — Telehealth: Payer: Self-pay

## 2012-11-07 DIAGNOSIS — E785 Hyperlipidemia, unspecified: Secondary | ICD-10-CM

## 2012-11-07 DIAGNOSIS — R0602 Shortness of breath: Secondary | ICD-10-CM

## 2012-11-07 DIAGNOSIS — Z79899 Other long term (current) drug therapy: Secondary | ICD-10-CM

## 2012-11-07 MED ORDER — ATORVASTATIN CALCIUM 20 MG PO TABS: 20.0000 mg | ORAL_TABLET | Freq: Every day | ORAL | Status: DC

## 2012-11-07 NOTE — Telephone Encounter (Signed)
Notified patient need to start on atorvastatin 20 mg take one tablet daily at bedtime. The patient is aware to get her lab work prior to her follow up appointment with Dr. Elease Hashimoto in March 2014. An order will be mailed to her for her to take to a local [facility] for blood draw.

## 2012-11-18 ENCOUNTER — Encounter: Payer: Self-pay | Admitting: Cardiovascular Disease

## 2012-11-24 ENCOUNTER — Telehealth: Payer: Self-pay | Admitting: *Deleted

## 2012-11-24 MED ORDER — ATORVASTATIN CALCIUM 40 MG PO TABS: 40.0000 mg | ORAL_TABLET | Freq: Every day | ORAL | Status: DC

## 2012-11-24 NOTE — Telephone Encounter (Signed)
Message copied by Antony Odea on Mon Nov 24, 2012  3:38 PM ------      Message from: [place], [place]      Created: Fri Nov 21, 2012  4:02 PM       LDL-P is 1944.  Increase Atorvastatin to 80.  Recheck lipomed profile, lever enzymes, BMP  in 3 months

## 2012-12-08 ENCOUNTER — Other Ambulatory Visit (INDEPENDENT_AMBULATORY_CARE_PROVIDER_SITE_OTHER): Payer: 59

## 2012-12-08 DIAGNOSIS — I10 Essential (primary) hypertension: Secondary | ICD-10-CM

## 2012-12-08 LAB — BASIC METABOLIC PANEL
BUN: 16 mg/dL (ref 6–23)
Calcium: 9.4 mg/dL (ref 8.4–10.5)
Chloride: 106 mEq/L (ref 96–112)
Creatinine, Ser: 0.9 mg/dL (ref 0.4–1.2)
GFR: 83.11 mL/min (ref >60.00)

## 2012-12-09 ENCOUNTER — Other Ambulatory Visit: Payer: Self-pay

## 2012-12-09 DIAGNOSIS — E876 Hypokalemia: Secondary | ICD-10-CM

## 2012-12-09 MED ORDER — POTASSIUM CHLORIDE CRYS ER 20 MEQ PO TBCR: 20.0000 meq | EXTENDED_RELEASE_TABLET | Freq: Every day | ORAL | Status: DC

## 2013-01-06 ENCOUNTER — Telehealth: Payer: Self-pay

## 2013-01-06 NOTE — Telephone Encounter (Signed)
lmtcb

## 2013-01-06 NOTE — Telephone Encounter (Signed)
BMP

## 2013-01-13 ENCOUNTER — Other Ambulatory Visit: Payer: Self-pay

## 2013-01-13 DIAGNOSIS — E785 Hyperlipidemia, unspecified: Secondary | ICD-10-CM

## 2013-01-13 NOTE — Telephone Encounter (Signed)
LMTCB

## 2013-01-16 ENCOUNTER — Ambulatory Visit (INDEPENDENT_AMBULATORY_CARE_PROVIDER_SITE_OTHER): Payer: 59 | Admitting: Emergency Medicine

## 2013-01-16 VITALS — BP 126/79 | HR 72 | Temp 97.9°F | Resp 16 | Ht 63.0 in | Wt 125.4 lb

## 2013-01-16 DIAGNOSIS — N3000 Acute cystitis without hematuria: Secondary | ICD-10-CM

## 2013-01-16 DIAGNOSIS — R300 Dysuria: Secondary | ICD-10-CM

## 2013-01-16 LAB — POCT UA - MICROSCOPIC ONLY: Yeast, UA: NEGATIVE

## 2013-01-16 LAB — POCT URINALYSIS DIPSTICK
Bilirubin, UA: NEGATIVE
Glucose, UA: NEGATIVE
Ketones, UA: NEGATIVE
Nitrite, UA: NEGATIVE

## 2013-01-16 MED ORDER — PHENAZOPYRIDINE HCL 200 MG PO TABS: 200.0000 mg | ORAL_TABLET | Freq: Three times a day (TID) | ORAL | Status: DC | PRN

## 2013-01-16 MED ORDER — CIPROFLOXACIN HCL 500 MG PO TABS: 500.0000 mg | ORAL_TABLET | Freq: Two times a day (BID) | ORAL | Status: DC

## 2013-01-16 NOTE — Patient Instructions (Addendum)
Urinary Tract Infection Urinary tract infections (UTIs) can develop anywhere along your urinary tract. Your urinary tract is your body's drainage system for removing wastes and extra water. Your urinary tract includes two kidneys, two ureters, a bladder, and a urethra. Your kidneys are a pair of bean-shaped organs. Each kidney is about the size of your fist. They are located below your ribs, one on each side of your spine. CAUSES Infections are caused by microbes, which are microscopic organisms, including fungi, viruses, and bacteria. These organisms are so small that they can only be seen through a microscope. Bacteria are the microbes that most commonly cause UTIs. SYMPTOMS  Symptoms of UTIs may vary by age and gender of the patient and by the location of the infection. Symptoms in young women typically include a frequent and intense urge to urinate and a painful, burning feeling in the bladder or urethra during urination. Older women and men are more likely to be tired, shaky, and weak and have muscle aches and abdominal pain. A fever may mean the infection is in your kidneys. Other symptoms of a kidney infection include pain in your back or sides below the ribs, nausea, and vomiting. DIAGNOSIS To diagnose a UTI, your caregiver will ask you about your symptoms. Your caregiver also will ask to provide a urine sample. The urine sample will be tested for bacteria and white blood cells. White blood cells are made by your body to help fight infection. TREATMENT  Typically, UTIs can be treated with medication. Because most UTIs are caused by a bacterial infection, they usually can be treated with the use of antibiotics. The choice of antibiotic and length of treatment depend on your symptoms and the type of bacteria causing your infection. HOME CARE INSTRUCTIONS  If you were prescribed antibiotics, take them exactly as your caregiver instructs you. Finish the medication even if you feel better after you  have only taken some of the medication.  Drink enough water and fluids to keep your urine clear or pale yellow.  Avoid caffeine, tea, and carbonated beverages. They tend to irritate your bladder.  Empty your bladder often. Avoid holding urine for long periods of time.  Empty your bladder before and after sexual intercourse.  After a bowel movement, women should cleanse from front to back. Use each tissue only once. SEEK MEDICAL CARE IF:   You have back pain.  You develop a fever.  Your symptoms do not begin to resolve within 3 days. SEEK IMMEDIATE MEDICAL CARE IF:   You have severe back pain or lower abdominal pain.  You develop chills.  You have nausea or vomiting.  You have continued burning or discomfort with urination. MAKE SURE YOU:   Understand these instructions.  Will watch your condition.  Will get help right away if you are not doing well or get worse. Document Released: 08/29/2005 Document Revised: 05/20/2012 Document Reviewed: 12/28/2011 [employer] Patient Information 2013 [employer].  

## 2013-01-16 NOTE — Progress Notes (Signed)
[facility] [address] [place] [address] [phone]  Date:  01/16/2013   Name:  Nancy [name]   DOB:  01/01/1966   MRN:  [id number]  PCP:  No primary provider on file.    Chief Complaint: Dysuria   History of Present Illness:  Nancy [name] is a 47 y.o. very pleasant female patient who presents with the following:  Dysuria, urgency and frequency and now has blood in urine.  No fever or chills.  No nausea or vomiting.  No stool change.  No GYN symptoms.  No improvement with home remedies.  History of prior UTI.    Patient Active Problem List  Diagnosis  . Hyperlipidemia  . Depression  . Dyspnea  . HTN (hypertension)    Past Medical History  Diagnosis Date  . Anxiety     Past Surgical History  Procedure Laterality Date  . Tubal ligation      bilateral  . Tonsillectomy    . Diagnostic laparoscopy    . Endometrial ablation w/ novasure      History  Substance Use Topics  . Smoking status: Never Smoker   . Smokeless tobacco: Not on file  . Alcohol Use: 0.6 oz/week    1 Glasses of wine per week    Family History  Problem Relation Age of Onset  . Coronary artery disease Father   . Hypertension Father   . Heart disease Paternal Uncle   . Heart attack Paternal Uncle   . Stroke Father   . Heart attack Father     No Known Allergies  Medication list has been reviewed and updated.  Current Outpatient Prescriptions on File Prior to Visit  Medication Sig Dispense Refill  . atorvastatin (LIPITOR) 40 MG tablet Take 1 tablet (40 mg total) by mouth daily.  30 tablet  3  . hydrochlorothiazide (HYDRODIURIL) 25 MG tablet Take 1 tablet (25 mg total) by mouth daily.  90 tablet  3  . norgestrel-ethinyl estradiol (LO/OVRAL,CRYSELLE) 0.3-30 MG-MCG tablet Take 1 tablet by mouth daily.      . potassium chloride SA (K-DUR,KLOR-CON) 20 MEQ tablet Take 1 tablet (20 mEq total) by mouth daily.  90 tablet  3  . acetic acid-hydrocortisone (VOSOL-HC) otic  solution Place 3 drops into the left ear 3 (three) times daily.  10 mL  0  . ALPRAZolam (XANAX) 0.25 MG tablet Take 1 tablet as needed prior to event; may take 2 tablets at bedtime as needed for sleep.  30 tablet  1  . aspirin 81 MG tablet Take 81 mg by mouth daily.      . cyclobenzaprine (FLEXERIL) 10 MG tablet Take 1 tablet (10 mg total) by mouth at bedtime as needed for muscle spasms.  30 tablet  0  . traMADol (ULTRAM) 50 MG tablet Take 1 tablet (50 mg total) by mouth every 8 (eight) hours as needed for pain.  30 tablet  0   No current facility-administered medications on file prior to visit.    Review of Systems:  As per HPI, otherwise negative.    Physical Examination: Filed Vitals:   01/16/13 1327  BP: 126/79  Pulse: 72  Temp: 97.9 F (36.6 C)  Resp: 16   Filed Vitals:   01/16/13 1327  Height: 5\' 3"  (1.6 m)  Weight: 125 lb 6.4 oz (56.881 kg)   Body mass index is 22.22 kg/(m^2). Ideal Body Weight: Weight in (lb) to have BMI = 25: 140.8  GEN:  WDWN, NAD, Non-toxic, A & O x 3 HEENT: Atraumatic, Normocephalic. Neck supple. No masses, No LAD. Ears and Nose: No external deformity. CV: RRR, No M/G/R. No JVD. No thrill. No extra heart sounds. PULM: CTA B, no wheezes, crackles, rhonchi. No retractions. No resp. distress. No accessory muscle use. ABD: S, NT, ND, +BS. No rebound. No HSM. EXTR: No c/c/e NEURO Normal gait.  PSYCH: Normally interactive. Conversant. Not depressed or anxious appearing.  Calm demeanor.    Assessment and Plan: Acute cystitis cipro pyridium  Carmelina Dane, MD  Results for orders placed in visit on 01/16/13  POCT UA - MICROSCOPIC ONLY      Result Value Range   WBC, Ur, HPF, POC 2-3     RBC, urine, microscopic 4-6     Bacteria, U Microscopic trace     Mucus, UA neg     Epithelial cells, urine per micros 0-4     Crystals, Ur, HPF, POC neg     Casts, Ur, LPF, POC neg     Yeast, UA neg    POCT URINALYSIS DIPSTICK      Result Value  Range   Color, UA yellow     Clarity, UA sl cloudy     Glucose, UA neg     Bilirubin, UA neg     Ketones, UA neg     Spec Grav, UA 1.020     Blood, UA moderate     pH, UA 6.5     Protein, UA neg     Urobilinogen, UA 0.2     Nitrite, UA neg     Leukocytes, UA Trace

## 2013-01-17 NOTE — Progress Notes (Signed)
Reviewed and agree.

## 2013-01-23 NOTE — Telephone Encounter (Signed)
LMOM- due for labs.  

## 2013-02-09 ENCOUNTER — Encounter: Payer: Self-pay | Admitting: *Deleted

## 2013-02-09 ENCOUNTER — Other Ambulatory Visit: Payer: Self-pay | Admitting: *Deleted

## 2013-02-09 ENCOUNTER — Ambulatory Visit (INDEPENDENT_AMBULATORY_CARE_PROVIDER_SITE_OTHER): Payer: 59 | Admitting: Cardiovascular Disease

## 2013-02-09 VITALS — BP 120/78 | HR 77 | Ht 63.0 in | Wt 126.0 lb

## 2013-02-09 DIAGNOSIS — I10 Essential (primary) hypertension: Secondary | ICD-10-CM

## 2013-02-09 MED ORDER — HYDROCHLOROTHIAZIDE 25 MG PO TABS: 25.0000 mg | ORAL_TABLET | Freq: Every day | ORAL | Status: DC

## 2013-02-09 MED ORDER — POTASSIUM CHLORIDE CRYS ER 20 MEQ PO TBCR: 20.0000 meq | EXTENDED_RELEASE_TABLET | Freq: Every day | ORAL | Status: DC

## 2013-02-09 MED ORDER — ATORVASTATIN CALCIUM 40 MG PO TABS: 40.0000 mg | ORAL_TABLET | Freq: Every day | ORAL | Status: DC

## 2013-02-09 NOTE — Assessment & Plan Note (Signed)
Her blood pressure is much better controlled hydrochlorothiazide and potassium. We'll continue with her current medications. She has also done a nice job of avoiding salt.

## 2013-02-09 NOTE — Progress Notes (Signed)
[name] Date of Birth  Sep 12, 1966       [facility]    [place] [address]  [address], [address] [place], [place]  [address]   [place], [place]  [address] [phone]     [phone]   Fax  [phone]    Fax [phone]  Problem List: 1. Chest pain 2. Hypertension  History of Present Illness:  Nancy [name] is a 47 yo lady who presents with exertional dyspnea and palpitations ( pounding).  She denies any sharp pain.  She walks on a regular basis - about 1 mile a day.  If she walks at a regular pace, she is OK  However, if she walks quickly or does line dancing, she becomes very dyspneic.  She still eats extra salt. She eat out frequently and really makes no effort to avoid salt.   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  She was seen several months ago. She is on have mild hypertension. I asked her to greatly cut down on her salt intake. I've asked her to exercise on a more regular basis.  She's feeling quite a bit better. She has been watching her salt. She's been trying to exercise on regular basis but has been slowed down a little bit because of the recent cold weather.  February 09, 2013: We started her on hydrochlorothiazide at her last visit. Her blood pressure readings are normal at this point.   Her shortness of breath with exercise has now resolved now that her blood pressure is in the normal range. She's doing quite a bit better at avoiding salt in her diet.  We also found that her LDL particle number was 1944 and started her on Atorvastatin 40.     Current Outpatient Prescriptions on File Prior to Visit  Medication Sig Dispense Refill  . ALPRAZolam (XANAX) 0.25 MG tablet Take 1 tablet as needed prior to event; may take 2 tablets at bedtime as needed for sleep.  30 tablet  1  . atorvastatin (LIPITOR) 40 MG tablet Take 1 tablet (40 mg total) by mouth daily.  30 tablet  3  . hydrochlorothiazide  (HYDRODIURIL) 25 MG tablet Take 1 tablet (25 mg total) by mouth daily.  90 tablet  3  . norgestrel-ethinyl estradiol (LO/OVRAL,CRYSELLE) 0.3-30 MG-MCG tablet Take 1 tablet by mouth daily.      . potassium chloride SA (K-DUR,KLOR-CON) 20 MEQ tablet Take 1 tablet (20 mEq total) by mouth daily.  90 tablet  3   No current facility-administered medications on file prior to visit.    No Known Allergies  Past Medical History  Diagnosis Date  . Anxiety   . Hyperlipidemia     Past Surgical History  Procedure Laterality Date  . Tubal ligation      bilateral  . Tonsillectomy    . Diagnostic laparoscopy    . Endometrial ablation w/ novasure      History  Smoking status  . Never Smoker   Smokeless tobacco  . Not on file    History  Alcohol Use  . 0.6 oz/week  . 1 Glasses of wine per week    Family History  Problem Relation Age of Onset  . Coronary artery disease Father   . Hypertension Father   . Heart disease Paternal Uncle   . Heart attack Paternal Uncle   . Stroke Father   . Heart attack Father  Reviw of Systems:  Reviewed in the HPI.  All other systems are negative.  Physical Exam: Blood pressure 120/78, pulse 77, height 5\' 3"  (1.6 m), weight 126 lb (57.153 kg), last menstrual period 12/24/2012, SpO2 99.00%. General: Well developed, well nourished, in no acute distress.  Head: Normocephalic, atraumatic, sclera non-icteric, mucus membranes are moist,   Neck: Supple. Carotids are 2 + without bruits. No JVD  Lungs: Clear bilaterally to auscultation.  Heart: regular rate.  normal  S1 S2. She has a soft systolic outflow murmur.    Abdomen: Soft, non-tender, non-distended with normal bowel sounds. No hepatomegaly. No rebound/guarding. No masses.  Msk:  Strength and tone are normal  Extremities: No clubbing or cyanosis. No edema.  Distal pedal pulses are 2+ and equal bilaterally.  Neuro: Alert and oriented X 3. Moves all extremities spontaneously.  Psych:   Responds to questions appropriately with a normal affect.  ECG: 11/03/2012-normal sinus rhythm at 90. Normal EKG.  Assessment / Plan:

## 2013-02-09 NOTE — Assessment & Plan Note (Signed)
She's on atorvastatin 40 mg a day. We'll recheck a lipoma profile later this week. We'll also get a hepatic profile and basic metabolic profile. We'll see her again in 6 months for followup office visit, lab work, and EKG.

## 2013-02-09 NOTE — Patient Instructions (Addendum)
Your physician wants you to follow-up in: 6 months with ekg  You will receive a reminder letter in the mail two months in advance. If you don't receive a letter, please call our office to schedule the follow-up appointment.  Your physician recommends that you return for a FASTING lipid profile: this Friday, drink plenty of water!  Your physician recommends that you return for a FASTING lipid profile: 6 months

## 2013-02-13 ENCOUNTER — Other Ambulatory Visit (INDEPENDENT_AMBULATORY_CARE_PROVIDER_SITE_OTHER): Payer: 59 | Admitting: *Deleted

## 2013-02-13 DIAGNOSIS — I10 Essential (primary) hypertension: Secondary | ICD-10-CM

## 2013-02-13 DIAGNOSIS — E785 Hyperlipidemia, unspecified: Secondary | ICD-10-CM

## 2013-02-13 LAB — BASIC METABOLIC PANEL
CO2: 25 mEq/L (ref 19–32)
Chloride: 107 mEq/L (ref 96–112)
Sodium: 138 mEq/L (ref 135–145)

## 2013-02-17 LAB — NMR LIPOPROFILE WITH LIPIDS
Cholesterol, Total: 147 mg/dL (ref <200)
HDL Particle Number: 29.9 umol/L — ABNORMAL LOW (ref >=30.5)
HDL-C: 43 mg/dL (ref >=40)
LDL (calc): 90 mg/dL (ref <100)
LP-IR Score: 50 — ABNORMAL HIGH (ref <=45)

## 2013-05-08 ENCOUNTER — Ambulatory Visit (INDEPENDENT_AMBULATORY_CARE_PROVIDER_SITE_OTHER): Payer: 59 | Admitting: Family Medicine

## 2013-05-08 ENCOUNTER — Encounter: Payer: Self-pay | Admitting: Family Medicine

## 2013-05-08 VITALS — BP 110/74 | HR 66 | Temp 98.9°F | Resp 16 | Ht 63.5 in | Wt 127.8 lb

## 2013-05-08 DIAGNOSIS — F411 Generalized anxiety disorder: Secondary | ICD-10-CM

## 2013-05-08 MED ORDER — CLONAZEPAM 0.5 MG PO TABS: 0.5000 mg | ORAL_TABLET | Freq: Two times a day (BID) | ORAL | Status: DC | PRN

## 2013-05-08 NOTE — Progress Notes (Signed)
S:  This 47 y.o. 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 She experiences a lot of stress with this and Alprazolam is not helpful. All other areas of her life are good. She has significant sleep disturbance, falling asleep at 10 PM but awakening at 4 AM and not going back to sleep. This is not a problem for her as she is "a morning person" but does get tired in the late afternoon. When advised that a medication suc as an SSRI might be more appropriate, pt reports using that type of med in the past and "not liking the way it made me feel". She does not want or need a daily medication but just something for "as needed" use. She has tried taking 2 Alprazolam tabs but finds that dose ineffective.  Patient Active Problem List   Diagnosis Date Noted  . HTN (hypertension) 11/03/2012  . Dyspnea 07/31/2012  . Hyperlipidemia 07/30/2012  . Depression 07/30/2012    PMHx, Soc Hx and Fam Hx reviewed.  ROS: As per HPI; negative for abnorma l weight change or appetite change, diaphoresis, fatigue, CP or tightness, palpitations, SOB or DOE, GI problems, MS complaints, HA, dizziness, numbness, weakness or syncope. She denies behavior disturbances, agitation or dysphoric mood.  O:  Filed Vitals:   05/08/13 0927  BP: 110/74  Pulse: 66  Temp: 98.9 F (37.2 C)  Resp: 16   GEN: In NAD; WN,WD. HENT: [place]/AT; EOMI w/ clear conj/sclerae. EACs/nose/oroph clear. NECK: No TMG. COR: RRR. No edema. LUNGS: Normal resp rate and effort. SKIN: W&D. No rashes or erythema. NEURO: A&O x 3; CNs intact. Nonfocal.  PSYCH: Pleasant, calm demeanor; speech pattern and thought content appropriate. Judgement is sound.  A/P: Anxiety state, unspecified- discussed strategies for dealing with situational anxiety. Pt willing to try different medicaiton- RX: Clonazepam 0.5 mg 1/2 -1 tablet bid prn anxiety. Also recommended self-help reading by authors  Jonathon Jordan and [employer]. She will contact office for follow-up if this medication is not effective.

## 2013-05-08 NOTE — Patient Instructions (Signed)
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 [name]- "The Power of Now" and "[employer]"

## 2013-06-26 ENCOUNTER — Encounter (HOSPITAL_COMMUNITY): Payer: Self-pay

## 2013-06-26 ENCOUNTER — Encounter (HOSPITAL_COMMUNITY)
Admission: RE | Admit: 2013-06-26 | Discharge: 2013-06-26 | Disposition: A | Discharge status: Home / Self Care | Payer: 59 | Source: Ambulatory Visit | Attending: Obstetrics and Gynecology | Admitting: Obstetrics and Gynecology

## 2013-06-26 DIAGNOSIS — Z01818 Encounter for other preprocedural examination: Secondary | ICD-10-CM | POA: Insufficient documentation

## 2013-06-26 DIAGNOSIS — Z01812 Encounter for preprocedural laboratory examination: Secondary | ICD-10-CM | POA: Insufficient documentation

## 2013-06-26 HISTORY — DX: Essential (primary) hypertension: I10

## 2013-06-26 LAB — CBC
Hemoglobin: 12.9 g/dL (ref 12.0–15.0)
MCH: 28.5 pg (ref 26.0–34.0)
MCV: 81.4 fL (ref 78.0–100.0)
RBC: 4.52 MIL/uL (ref 3.87–5.11)

## 2013-06-26 LAB — BASIC METABOLIC PANEL
Calcium: 10.1 mg/dL (ref 8.4–10.5)
Creatinine, Ser: 0.81 mg/dL (ref 0.50–1.10)
GFR calc non Af Amer: 85 mL/min — ABNORMAL LOW (ref >90)
Sodium: 134 mEq/L — ABNORMAL LOW (ref 135–145)

## 2013-06-26 NOTE — Patient Instructions (Addendum)
20 Nancy [name]  06/26/2013   Your procedure is scheduled on:  07/07/13  Enter through the Main Entrance of [facility] at 6 AM.  Pick up the phone at the desk and dial [phone].   Call this number if you have problems the morning of surgery: [phone]   Remember:   Do not eat food:After Midnight.  Do not drink clear liquids: After Midnight.  Take these medicines the morning of surgery with A SIP OF WATER: Blood pressure medication and potassium   Do not wear jewelry, make-up or nail polish.  Do not wear lotions, powders, or perfumes. You may wear deodorant.  Do not shave 48 hours prior to surgery.  Do not bring valuables to the hospital.  [facility] is not responsible                  for any belongings or valuables brought to the hospital.  Contacts, dentures or bridgework may not be worn into surgery.  Leave suitcase in the car. After surgery it may be brought to your room.  For patients admitted to the hospital, checkout time is 11:00 AM the day of                discharge.   Patients discharged the day of surgery will not be allowed to drive                   home.  Name and phone number of your driver: NA  Special Instructions: Shower using CHG 2 nights before surgery and the night before surgery.  If you shower the day of surgery use CHG.  Use special wash - you have one bottle of CHG for all showers.  You should use approximately 1/3 of the bottle for each shower.   Please read over the following fact sheets that you were given: Surgical Site Infection Prevention

## 2013-07-01 NOTE — H&P (Signed)
NAME:  Nancy [name], Nancy [name] NO.:  [id number]  MEDICAL RECORD NO.:  [id number]  LOCATION:                                 FACILITY:  PHYSICIAN:  [name], M.D.   DATE OF BIRTH:  11-Feb-1966  DATE OF ADMISSION: DATE OF DISCHARGE:                             HISTORY & PHYSICAL   HISTORY OF PRESENT ILLNESS:  The patient is a 47 year old, gravida 2, para 2 female presents for laparoscopic-assisted vaginal hysterectomy. The patient had a longstanding history of pelvic pain and discomfort with associated menorrhagia.  She has had a previous endometrial ablation using the NovaSure.  She continues to have monthly pain and discomfort.  She has spotting all along.  Previous ultrasounds have been suggestive of adenomyosis.  She has been placed on birth control pills with normal response.  In somewhat contraindicated view that she is being managed for hypertension.  We had discussed various options in terms of managing this.  Right now she presents for laparoscopic- assisted vaginal hysterectomy.  Ovaries will be left in place as they were normal.  She does not understand the potential risk of malignant transformation although there is no family historyof ovarian cancer.  ALLERGIES:  No known drug allergies.  MEDICATIONS:  At the present time, she is on hydrochlorothiazide 25 mg, Klonopin 0.5 mg as needed, potassium replacement, she is on a statin 40 mg and birth control pills.  PAST MEDICAL HISTORY:  Significant for hypertension as well as hyperlipidemia.  PAST SURGICAL HISTORY:  She has had a previous tib in 1986, tonsillectomy at age 23, postpartum bilateral tubal ligation.  She had a diagnostic laparoscopy in 2007 with negative findings, in 2008 she had hysteroscopy with NovaSure ablation.  She has also had 2 vaginal deliveries.  SOCIAL HISTORY:  Significant in that there is a family history of early MI in her father.  REVIEW OF SYSTEMS:   Noncontributory.  PHYSICAL EXAMINATION:  VITAL SIGNS:  The patient is afebrile, stable vital signs. HEENT:  The patient is normocephalic.  Pupils equal, round, and reactive to light and accommodation.  Extraocular movements are intact.  Sclerae and conjunctivae are clear.  Oropharynx is clear. NECK:  Without thyromegaly. BREASTS:  Not examined. LUNGS:  Clear. CARDIOVASCULAR:  Regular rhythm and rate.  There are no murmurs or gallops. ABDOMEN:  Benign.  No mass, organomegaly, or tenderness. PELVIC:  Normal external genitalia.  Vaginal mucosa is clear.  Cervix unremarkable.  Uterus upper limits of normal size consistent with adenomyosis.  Adnexa unremarkable. EXTREMITIES:  Trace edema. NEUROLOGIC:  Grossly within normal limits.  IMPRESSION: 1. Continued pelvic pain and discomfort secondary to adenomyosis. 2. Hypertension. 3. Hyperlipidemia.  PLAN OF MANAGEMENT:  The patient will undergo laparoscopic-assisted vaginal hysterectomy.  The risks of surgery have been discussed including the risk of infection, the risk of hemorrhage that could require transfusion with the risk of AIDS or hepatitis.  Risk of injury to adjacent organs such as bladder, bowel, ureters that could require further exploratory surgery.  Risk of deep venous thrombosis and pulmonary embolus.  The patient expressed understanding of indications and risks.  Also understands the ovaries of potential malignant transformation although  again there is no family history.     [name], M.D.     JSM/MEDQ  D:  07/01/2013  T:  07/01/2013  Job:  [id number]

## 2013-07-01 NOTE — H&P (Signed)
  Patient name Nancy [name], Flinders DICTATION# [id number] CSN# [id number]  [name], MD 07/01/2013 6:10 AM

## 2013-07-06 ENCOUNTER — Encounter (HOSPITAL_COMMUNITY): Payer: Self-pay | Admitting: Pharmacy Technician

## 2013-07-07 ENCOUNTER — Encounter (HOSPITAL_COMMUNITY): Payer: Self-pay | Admitting: Anesthesiology

## 2013-07-07 ENCOUNTER — Encounter (HOSPITAL_COMMUNITY)
Admission: RE | Disposition: A | Discharge status: Home / Self Care | Payer: Self-pay | Source: Ambulatory Visit | Attending: Obstetrics and Gynecology

## 2013-07-07 ENCOUNTER — Observation Stay (HOSPITAL_COMMUNITY)
Admission: RE | Admit: 2013-07-07 | Discharge: 2013-07-08 | Disposition: A | Discharge status: Home / Self Care | Payer: 59 | Source: Ambulatory Visit | Attending: Obstetrics and Gynecology | Admitting: Obstetrics and Gynecology

## 2013-07-07 ENCOUNTER — Ambulatory Visit (HOSPITAL_COMMUNITY): Payer: 59 | Admitting: Anesthesiology

## 2013-07-07 ENCOUNTER — Encounter (HOSPITAL_COMMUNITY): Payer: Self-pay | Admitting: *Deleted

## 2013-07-07 DIAGNOSIS — E785 Hyperlipidemia, unspecified: Secondary | ICD-10-CM | POA: Insufficient documentation

## 2013-07-07 DIAGNOSIS — Z90710 Acquired absence of both cervix and uterus: Secondary | ICD-10-CM | POA: Clinically undetermined

## 2013-07-07 DIAGNOSIS — D251 Intramural leiomyoma of uterus: Secondary | ICD-10-CM | POA: Insufficient documentation

## 2013-07-07 DIAGNOSIS — D252 Subserosal leiomyoma of uterus: Secondary | ICD-10-CM | POA: Insufficient documentation

## 2013-07-07 DIAGNOSIS — I10 Essential (primary) hypertension: Secondary | ICD-10-CM | POA: Insufficient documentation

## 2013-07-07 DIAGNOSIS — N870 Mild cervical dysplasia: Secondary | ICD-10-CM | POA: Insufficient documentation

## 2013-07-07 DIAGNOSIS — N8000 Endometriosis of the uterus, unspecified: Principal | ICD-10-CM | POA: Insufficient documentation

## 2013-07-07 DIAGNOSIS — N920 Excessive and frequent menstruation with regular cycle: Secondary | ICD-10-CM | POA: Insufficient documentation

## 2013-07-07 DIAGNOSIS — N949 Unspecified condition associated with female genital organs and menstrual cycle: Secondary | ICD-10-CM | POA: Insufficient documentation

## 2013-07-07 HISTORY — PX: LAPAROSCOPIC ASSISTED VAGINAL HYSTERECTOMY: SHX5398

## 2013-07-07 LAB — HCG, SERUM, QUALITATIVE: Preg, Serum: NEGATIVE

## 2013-07-07 SURGERY — HYSTERECTOMY, VAGINAL, LAPAROSCOPY-ASSISTED
Anesthesia: General | Site: Abdomen | Wound class: Clean Contaminated

## 2013-07-07 MED ORDER — 0.9 % SODIUM CHLORIDE (POUR BTL) OPTIME
TOPICAL | Status: DC | PRN
  Administered 2013-07-07: 1000 mL

## 2013-07-07 MED ORDER — PROMETHAZINE HCL 25 MG/ML IJ SOLN: 6.2500 mg | INTRAMUSCULAR | Status: DC | PRN

## 2013-07-07 MED ORDER — FENTANYL CITRATE 0.05 MG/ML IJ SOLN
INTRAMUSCULAR | Status: DC | PRN
  Administered 2013-07-07 (×3): 50 ug via INTRAVENOUS
  Administered 2013-07-07: 100 ug via INTRAVENOUS
  Administered 2013-07-07 (×2): 50 ug via INTRAVENOUS

## 2013-07-07 MED ORDER — FENTANYL CITRATE 0.05 MG/ML IJ SOLN
INTRAMUSCULAR | Status: AC
  Filled 2013-07-07: qty 5

## 2013-07-07 MED ORDER — LACTATED RINGERS IV SOLN: INTRAVENOUS | Status: DC

## 2013-07-07 MED ORDER — CEFAZOLIN SODIUM-DEXTROSE 2-3 GM-% IV SOLR
INTRAVENOUS | Status: AC
  Filled 2013-07-07: qty 50

## 2013-07-07 MED ORDER — MIDAZOLAM HCL 2 MG/2ML IJ SOLN
INTRAMUSCULAR | Status: AC
  Filled 2013-07-07: qty 2

## 2013-07-07 MED ORDER — DIPHENHYDRAMINE HCL 50 MG/ML IJ SOLN: 12.5000 mg | Freq: Four times a day (QID) | INTRAMUSCULAR | Status: DC | PRN

## 2013-07-07 MED ORDER — OXYCODONE-ACETAMINOPHEN 5-325 MG PO TABS
1.0000 | ORAL_TABLET | ORAL | Status: DC | PRN
  Administered 2013-07-07: 1 via ORAL
  Administered 2013-07-08: 2 via ORAL
  Filled 2013-07-07: qty 2
  Filled 2013-07-07: qty 1

## 2013-07-07 MED ORDER — PROPOFOL 10 MG/ML IV BOLUS
INTRAVENOUS | Status: DC | PRN
  Administered 2013-07-07: 150 mg via INTRAVENOUS

## 2013-07-07 MED ORDER — ZOLPIDEM TARTRATE 5 MG PO TABS: 5.0000 mg | ORAL_TABLET | Freq: Every evening | ORAL | Status: DC | PRN

## 2013-07-07 MED ORDER — LIDOCAINE HCL (CARDIAC) 20 MG/ML IV SOLN
INTRAVENOUS | Status: DC | PRN
  Administered 2013-07-07: 100 mg via INTRAVENOUS

## 2013-07-07 MED ORDER — CLONAZEPAM 0.5 MG PO TABS: 0.5000 mg | ORAL_TABLET | Freq: Two times a day (BID) | ORAL | Status: DC | PRN

## 2013-07-07 MED ORDER — SODIUM CHLORIDE 0.9 % IJ SOLN: 9.0000 mL | INTRAMUSCULAR | Status: DC | PRN

## 2013-07-07 MED ORDER — ROCURONIUM BROMIDE 100 MG/10ML IV SOLN
INTRAVENOUS | Status: DC | PRN
  Administered 2013-07-07: 50 mg via INTRAVENOUS

## 2013-07-07 MED ORDER — ONDANSETRON HCL 4 MG/2ML IJ SOLN: 4.0000 mg | Freq: Four times a day (QID) | INTRAMUSCULAR | Status: DC | PRN

## 2013-07-07 MED ORDER — GLYCOPYRROLATE 0.2 MG/ML IJ SOLN
INTRAMUSCULAR | Status: AC
  Filled 2013-07-07: qty 2

## 2013-07-07 MED ORDER — LACTATED RINGERS IR SOLN
Status: DC | PRN
  Administered 2013-07-07: 3000 mL

## 2013-07-07 MED ORDER — DEXAMETHASONE SODIUM PHOSPHATE 10 MG/ML IJ SOLN
INTRAMUSCULAR | Status: DC | PRN
  Administered 2013-07-07: 10 mg via INTRAVENOUS

## 2013-07-07 MED ORDER — BUPIVACAINE HCL (PF) 0.25 % IJ SOLN
INTRAMUSCULAR | Status: AC
  Filled 2013-07-07: qty 30

## 2013-07-07 MED ORDER — ONDANSETRON HCL 4 MG/2ML IJ SOLN
INTRAMUSCULAR | Status: AC
  Filled 2013-07-07: qty 2

## 2013-07-07 MED ORDER — KETOROLAC TROMETHAMINE 30 MG/ML IJ SOLN
INTRAMUSCULAR | Status: AC
  Filled 2013-07-07: qty 1

## 2013-07-07 MED ORDER — HYDROMORPHONE 0.3 MG/ML IV SOLN
INTRAVENOUS | Status: DC
  Administered 2013-07-07: 10:00:00 via INTRAVENOUS
  Filled 2013-07-07: qty 25

## 2013-07-07 MED ORDER — LACTATED RINGERS IV SOLN
INTRAVENOUS | Status: DC
  Administered 2013-07-07 (×2): via INTRAVENOUS

## 2013-07-07 MED ORDER — MENTHOL 3 MG MT LOZG: 1.0000 | LOZENGE | OROMUCOSAL | Status: DC | PRN

## 2013-07-07 MED ORDER — KETOROLAC TROMETHAMINE 30 MG/ML IJ SOLN
INTRAMUSCULAR | Status: DC | PRN
  Administered 2013-07-07: 30 mg via INTRAMUSCULAR

## 2013-07-07 MED ORDER — KETOROLAC TROMETHAMINE 30 MG/ML IJ SOLN: 15.0000 mg | Freq: Once | INTRAMUSCULAR | Status: DC | PRN

## 2013-07-07 MED ORDER — BUPIVACAINE HCL (PF) 0.25 % IJ SOLN
INTRAMUSCULAR | Status: DC | PRN
  Administered 2013-07-07: 30 mL

## 2013-07-07 MED ORDER — MIDAZOLAM HCL 5 MG/5ML IJ SOLN
INTRAMUSCULAR | Status: DC | PRN
  Administered 2013-07-07: 2 mg via INTRAVENOUS

## 2013-07-07 MED ORDER — DIPHENHYDRAMINE HCL 12.5 MG/5ML PO ELIX: 12.5000 mg | ORAL_SOLUTION | Freq: Four times a day (QID) | ORAL | Status: DC | PRN

## 2013-07-07 MED ORDER — HYDROCHLOROTHIAZIDE 25 MG PO TABS
25.0000 mg | ORAL_TABLET | Freq: Every day | ORAL | Status: DC
  Filled 2013-07-07: qty 1

## 2013-07-07 MED ORDER — GLYCOPYRROLATE 0.2 MG/ML IJ SOLN
INTRAMUSCULAR | Status: DC | PRN
  Administered 2013-07-07: 0.4 mg via INTRAVENOUS

## 2013-07-07 MED ORDER — ONDANSETRON HCL 4 MG/2ML IJ SOLN
INTRAMUSCULAR | Status: DC | PRN
  Administered 2013-07-07: 4 mg via INTRAVENOUS

## 2013-07-07 MED ORDER — NEOSTIGMINE METHYLSULFATE 1 MG/ML IJ SOLN
INTRAMUSCULAR | Status: DC | PRN
  Administered 2013-07-07: 2 mg via INTRAVENOUS

## 2013-07-07 MED ORDER — HYDROMORPHONE HCL PF 1 MG/ML IJ SOLN
INTRAMUSCULAR | Status: AC
  Administered 2013-07-07: 0.5 mg via INTRAVENOUS
  Filled 2013-07-07: qty 1

## 2013-07-07 MED ORDER — NEOSTIGMINE METHYLSULFATE 1 MG/ML IJ SOLN
INTRAMUSCULAR | Status: AC
  Filled 2013-07-07: qty 1

## 2013-07-07 MED ORDER — ROCURONIUM BROMIDE 50 MG/5ML IV SOLN
INTRAVENOUS | Status: AC
  Filled 2013-07-07: qty 1

## 2013-07-07 MED ORDER — HYDROMORPHONE HCL PF 1 MG/ML IJ SOLN
0.2500 mg | INTRAMUSCULAR | Status: DC | PRN
  Administered 2013-07-07: 0.5 mg via INTRAVENOUS

## 2013-07-07 MED ORDER — MEPERIDINE HCL 25 MG/ML IJ SOLN: 6.2500 mg | INTRAMUSCULAR | Status: DC | PRN

## 2013-07-07 MED ORDER — LIDOCAINE HCL (CARDIAC) 20 MG/ML IV SOLN
INTRAVENOUS | Status: AC
  Filled 2013-07-07: qty 5

## 2013-07-07 MED ORDER — NALOXONE HCL 0.4 MG/ML IJ SOLN: 0.4000 mg | INTRAMUSCULAR | Status: DC | PRN

## 2013-07-07 MED ORDER — DEXAMETHASONE SODIUM PHOSPHATE 10 MG/ML IJ SOLN
INTRAMUSCULAR | Status: AC
  Filled 2013-07-07: qty 1

## 2013-07-07 MED ORDER — CEFAZOLIN SODIUM-DEXTROSE 2-3 GM-% IV SOLR
2.0000 g | INTRAVENOUS | Status: AC
  Administered 2013-07-07: 2 g via INTRAVENOUS

## 2013-07-07 MED ORDER — PROPOFOL 10 MG/ML IV EMUL
INTRAVENOUS | Status: AC
  Filled 2013-07-07: qty 20

## 2013-07-07 MED ORDER — LACTATED RINGERS IV SOLN
INTRAVENOUS | Status: DC
  Administered 2013-07-07 (×4): via INTRAVENOUS

## 2013-07-07 MED ORDER — ONDANSETRON HCL 4 MG PO TABS: 4.0000 mg | ORAL_TABLET | Freq: Four times a day (QID) | ORAL | Status: DC | PRN

## 2013-07-07 MED ORDER — ACETAMINOPHEN 325 MG PO TABS: 650.0000 mg | ORAL_TABLET | ORAL | Status: DC | PRN

## 2013-07-07 SURGICAL SUPPLY — 50 items
ADH SKN CLS APL DERMABOND .7 (GAUZE/BANDAGES/DRESSINGS) ×1
ADH SKN CLS LQ APL DERMABOND (GAUZE/BANDAGES/DRESSINGS) ×1
CABLE HIGH FREQUENCY MONO STRZ (ELECTRODE) IMPLANT
CATH ROBINSON RED A/P 16FR (CATHETERS) ×2 IMPLANT
CLOTH BEACON ORANGE TIMEOUT ST (SAFETY) ×2 IMPLANT
CONT PATH 16OZ SNAP LID 3702 (MISCELLANEOUS) ×2 IMPLANT
COVER TABLE BACK 60X90 (DRAPES) ×2 IMPLANT
DECANTER SPIKE VIAL GLASS SM (MISCELLANEOUS) IMPLANT
DERMABOND ADHESIVE PROPEN (GAUZE/BANDAGES/DRESSINGS) ×1
DERMABOND ADVANCED (GAUZE/BANDAGES/DRESSINGS) ×1
DERMABOND ADVANCED .7 DNX12 (GAUZE/BANDAGES/DRESSINGS) ×1 IMPLANT
DERMABOND ADVANCED .7 DNX6 (GAUZE/BANDAGES/DRESSINGS) IMPLANT
DRSG COVADERM PLUS 2X2 (GAUZE/BANDAGES/DRESSINGS) ×1 IMPLANT
ELECT REM PT RETURN 9FT ADLT (ELECTROSURGICAL) ×2
ELECTRODE REM PT RTRN 9FT ADLT (ELECTROSURGICAL) IMPLANT
EVACUATOR SMOKE 8.L (FILTER) IMPLANT
GLOVE BIO SURGEON STRL SZ 6 (GLOVE) ×1 IMPLANT
GLOVE BIO SURGEON STRL SZ7 (GLOVE) ×4 IMPLANT
GLOVE BIOGEL PI IND STRL 6 (GLOVE) IMPLANT
GLOVE BIOGEL PI IND STRL 6.5 (GLOVE) ×1 IMPLANT
GLOVE BIOGEL PI IND STRL 7.0 (GLOVE) IMPLANT
GLOVE BIOGEL PI INDICATOR 6 (GLOVE) ×1
GLOVE BIOGEL PI INDICATOR 6.5 (GLOVE) ×2
GLOVE BIOGEL PI INDICATOR 7.0 (GLOVE) ×1
GLOVE SKINSENSE STRL SZ6.0 (GLOVE) ×1 IMPLANT
GOWN STRL REIN XL XLG (GOWN DISPOSABLE) ×8 IMPLANT
NEEDLE INSUFFLATION 120MM (ENDOMECHANICALS) IMPLANT
NS IRRIG 1000ML POUR BTL (IV SOLUTION) ×2 IMPLANT
PACK LAVH (CUSTOM PROCEDURE TRAY) ×2 IMPLANT
PROTECTOR NERVE ULNAR (MISCELLANEOUS) ×2 IMPLANT
SEALER TISSUE G2 CVD JAW 45CM (ENDOMECHANICALS) ×2 IMPLANT
SET CYSTO W/LG BORE CLAMP LF (SET/KITS/TRAYS/PACK) IMPLANT
SET IRRIG TUBING LAPAROSCOPIC (IRRIGATION / IRRIGATOR) IMPLANT
STRIP CLOSURE SKIN 1/4X3 (GAUZE/BANDAGES/DRESSINGS) IMPLANT
SUT MON AB 2-0 CT1 27 (SUTURE) ×4 IMPLANT
SUT VIC AB 0 CT1 18XCR BRD8 (SUTURE) ×2 IMPLANT
SUT VIC AB 0 CT1 27 (SUTURE) ×2
SUT VIC AB 0 CT1 27XBRD ANBCTR (SUTURE) ×1 IMPLANT
SUT VIC AB 0 CT1 36 (SUTURE) ×2 IMPLANT
SUT VIC AB 0 CT1 8-18 (SUTURE) ×4
SUT VICRYL 0 UR6 27IN ABS (SUTURE) ×1 IMPLANT
SUT VICRYL 1 TIES 12X18 (SUTURE) ×2 IMPLANT
SUT VICRYL 4-0 PS2 18IN ABS (SUTURE) ×2 IMPLANT
TOWEL OR 17X24 6PK STRL BLUE (TOWEL DISPOSABLE) ×4 IMPLANT
TRAY FOLEY CATH 14FR (SET/KITS/TRAYS/PACK) ×2 IMPLANT
TROCAR BALLN 12MMX100 BLUNT (TROCAR) ×1 IMPLANT
TROCAR OPTI TIP 5M 100M (ENDOMECHANICALS) ×2 IMPLANT
TROCAR XCEL DIL TIP R 11M (ENDOMECHANICALS) IMPLANT
WARMER LAPAROSCOPE (MISCELLANEOUS) ×2 IMPLANT
WATER STERILE IRR 1000ML POUR (IV SOLUTION) ×2 IMPLANT

## 2013-07-07 NOTE — Anesthesia Postprocedure Evaluation (Signed)
Anesthesia Post Note  Patient: Nancy [name]  Procedure(s) Performed: Procedure(s) (LRB): LAPAROSCOPIC ASSISTED VAGINAL HYSTERECTOMY (N/A)  Anesthesia type: General  Patient location: Women's Unit  Post pain: Pain level controlled  Post assessment: Post-op Vital signs reviewed  Last Vitals:  Filed Vitals:   07/07/13 1744  BP: 100/62  Pulse: 93  Temp: 36.8 C  Resp: 18    Post vital signs: Reviewed  Level of consciousness: sedated  Complications: No apparent anesthesia complications

## 2013-07-07 NOTE — Op Note (Signed)
Patient name  Nancy [name], Nancy [name] DICTATION#  [id number] CSN# [id number]  [name], MD 07/07/2013 8:44 AM

## 2013-07-07 NOTE — H&P (Signed)
  History and physical exam unchanged 

## 2013-07-07 NOTE — Op Note (Signed)
NAMETAMRE, CASS NO.:  [id number]  MEDICAL RECORD NO.:  [id number]  LOCATION:  9310                          FACILITY:  [facility]  PHYSICIAN:  [name], M.D.   DATE OF BIRTH:  06-12-1966  DATE OF PROCEDURE:  07/07/2013 DATE OF DISCHARGE:                              OPERATIVE REPORT   PREOPERATIVE DIAGNOSIS:  Pelvic pain secondary to adenomyosis.  POSTOPERATIVE DIAGNOSIS:  Pelvic pain secondary to adenomyosis.  PROCEDURES:  Laparoscopic-assisted vaginal hysterectomy.  SURGEON:  [name], M.D.  ASSISTANT:  Aundra Millet Morris  ANESTHESIA:  General endotracheal.  ESTIMATED BLOOD LOSS:  300 mL.  PACKS:  None.  DRAINS:  Included urethral Foley.  INTRAOPERATIVE BLOOD REPLACEMENT:  None.  COMPLICATIONS:  None.  INDICATION:  Dictated in history and physical.  PROCEDURE IN DETAIL:  The patient was taken to the OR and placed in supine position.  After satisfactory level of general endotracheal anesthesia was obtained, the patient was placed in the dorsal lithotomy position using the Allen stirrups.  The abdomen, perineum, and vagina were prepped out with Betadine.  Bladder was emptied by in and out catheterization.  The Hulka tenaculum was put in place and secured.  The patient was draped in sterile field.  Subumbilical incision made with knife.  Fascia was identified and entered sharply.  Incision was fashioned laterally.  Peritoneum was identified and entered sharply. The open laparoscopic trocar was put in place and secured.  The abdomen was insufflated with carbon dioxide.  Laparoscope was introduced.  There was no evidence of injury to adjacent organs.  A 5-mm trocar was put in place in the suprapubic area under direct visualization.  Appendix was normal.  Upper abdomen including liver, tip of the gallbladder and both lateral gutters were normal.  Looking into the pelvis, the uterus was enlarged with several fibroids.  Tubes and ovaries were  unremarkable. Using the EnSeal, the utero-ovarian pedicles on each side were cauterized and incised.  Both round ligaments were also cauterized and incised.  At this point in time, we decided to go vaginally.  The laparoscope was removed.  The abdomen was deflated with carbon dioxide. The patient's legs were repositioned.  The Hulka tenaculum was removed. A weighted speculum was placed in vaginal vault.  The cervix was grasped with Christella Hartigan tenaculum.  Cul-de-sac was entered sharply.  Both uterosacral ligaments were clamped, cut, and suture ligated with 0 Vicryl.  The reflection of vaginal mucosa anteriorly was incised, and bladder was dissected superiorly.  Paracervical tissue was clamped, cut, and suture ligated with 0 Vicryl.  Vesicouterine space was identified and entered sharply.  The retractor was put in place to retract the bladder superiorly.  Using the clamp cut and tie technique with suture ligature of 0 Vicryl, the parametrium was serially separated from sides of uterus.  The uterus was then flipped.  Remaining pedicles were clamped and cut.  Uterus and cervix were passed off the operative field and sent to Pathology.  Held pedicles were secured with free ties of 0 Vicryl.  Posterior vaginal cuff was reapproximated with running locked suture of 0 Vicryl.  Vaginal mucosa was closed with interrupted figure-  of-eights of 2-0 Monocryl.  A Foley was placed to straight drain with clear urine.  The patient's legs were repositioned.  Laparoscope was reintroduced. She had minimal oozing from the cuff, brought under control using the EnSeal.  Both ovarian sites were hemostatically intact.  We thoroughly irrigated the pelvis.  We then de-inflated the abdomen, re-looked. There was no active bleeding.  All irrigation had been removed.  At this point in time, the abdomen was deflated with carbon dioxide.  All trocars were removed.  Subumbilical fascia was closed with figure-of- eight of 0  Vicryl.  Skin was closed with interrupted subcuticulars of 4- 0 Vicryl.  Suprapubic incision was closed with Dermabond.  The patient was taken out of dorsal lithotomy position.  Once alert, extubated, transferred to recovery room in good condition.  Sponge, instrument, and needle count was reported correct by circulating nurse x2.     [name], M.D.     JSM/MEDQ  D:  07/07/2013  T:  07/07/2013  Job:  [id number]

## 2013-07-07 NOTE — Brief Op Note (Signed)
07/07/2013  8:43 AM  PATIENT:  Nancy [name]  47 y.o. female  PRE-OPERATIVE DIAGNOSIS:  PELVIC PAIN, ADENOMYOSIS CPT [id number]  POST-OPERATIVE DIAGNOSIS:  pelvic pain adenomyosis  PROCEDURE:  Procedure(s): LAPAROSCOPIC ASSISTED VAGINAL HYSTERECTOMY (N/A)  SURGEON:  Surgeon(s) and Role:    * [name], MD - Primary    * Mitchel Honour, DO - Assisting  PHYSICIAN ASSISTANT:   ASSISTANTS: morris   ANESTHESIA:   local and general  EBL:  Total I/O In: 2000 [I.V.:2000] Out: 250 [Blood:250]  BLOOD ADMINISTERED:none  DRAINS: Urinary Catheter (Foley)   LOCAL MEDICATIONS USED:  MARCAINE     SPECIMEN:  Source of Specimen:  uterus  DISPOSITION OF SPECIMEN:  PATHOLOGY  COUNTS:  YES  TOURNIQUET:  * No tourniquets in log *  DICTATION: .Other Dictation: Dictation Number [id number]  PLAN OF CARE: Admit for overnight observation  PATIENT DISPOSITION:  PACU - hemodynamically stable.   Delay start of Pharmacological VTE agent (>24hrs) due to surgical blood loss or risk of bleeding: no

## 2013-07-07 NOTE — Anesthesia Preprocedure Evaluation (Signed)
Anesthesia Evaluation  Patient identified by MRN, date of birth, ID band Patient awake    Reviewed: Allergy & Precautions, H&P , NPO status , Patient's Chart, lab work & pertinent test results  Airway Mallampati: I TM Distance: >3 FB Neck ROM: full    Dental no notable dental hx. (+) Teeth Intact   Pulmonary neg pulmonary ROS,    Pulmonary exam normal       Cardiovascular hypertension, Pt. on medications Rhythm:regular     Neuro/Psych negative neurological ROS     GI/Hepatic negative GI ROS, Neg liver ROS,   Endo/Other  negative endocrine ROS  Renal/GU negative Renal ROS     Musculoskeletal negative musculoskeletal ROS (+)   Abdominal Normal abdominal exam  (+)   Peds  Hematology negative hematology ROS (+)   Anesthesia Other Findings   Reproductive/Obstetrics negative OB ROS                           Anesthesia Physical Anesthesia Plan  ASA: II  Anesthesia Plan: General   Post-op Pain Management:    Induction: Intravenous  Airway Management Planned: Oral ETT  Additional Equipment:   Intra-op Plan:   Post-operative Plan: Extubation in OR  Informed Consent: I have reviewed the patients History and Physical, chart, labs and discussed the procedure including the risks, benefits and alternatives for the proposed anesthesia with the patient or authorized representative who has indicated his/her understanding and acceptance.   Dental Advisory Given  Plan Discussed with: CRNA, Anesthesiologist and Surgeon  Anesthesia Plan Comments:         Anesthesia Quick Evaluation

## 2013-07-07 NOTE — Transfer of Care (Signed)
Immediate Anesthesia Transfer of Care Note  Patient: Nancy [name]  Procedure(s) Performed: Procedure(s): LAPAROSCOPIC ASSISTED VAGINAL HYSTERECTOMY (N/A)  Patient Location: PACU  Anesthesia Type:General  Level of Consciousness: awake, alert , oriented and patient cooperative  Airway & Oxygen Therapy: Patient Spontanous Breathing and Patient connected to nasal cannula oxygen  Post-op Assessment: Report given to PACU RN and Post -op Vital signs reviewed and stable  Post vital signs: Reviewed and stable  Complications: No apparent anesthesia complications

## 2013-07-07 NOTE — Anesthesia Postprocedure Evaluation (Signed)
  Anesthesia Post-op Note  Patient: Nancy [name]  Procedure(s) Performed: Procedure(s): LAPAROSCOPIC ASSISTED VAGINAL HYSTERECTOMY (N/A) Patient is awake and responsive. Pain and nausea are reasonably well controlled. Vital signs are stable and clinically acceptable. Oxygen saturation is clinically acceptable. There are no apparent anesthetic complications at this time. Patient is ready for discharge.

## 2013-07-07 NOTE — Preoperative (Signed)
Beta Blockers   Reason not to administer Beta Blockers:Not Applicable 

## 2013-07-08 ENCOUNTER — Encounter (HOSPITAL_COMMUNITY): Payer: Self-pay | Admitting: Obstetrics and Gynecology

## 2013-07-08 LAB — CBC
HCT: 24.9 % — ABNORMAL LOW (ref 36.0–46.0)
MCHC: 35.7 g/dL (ref 30.0–36.0)
MCV: 80.6 fL (ref 78.0–100.0)
RDW: 12.3 % (ref 11.5–15.5)

## 2013-07-08 MED ORDER — OXYCODONE-ACETAMINOPHEN 7.5-325 MG PO TABS: 1.0000 | ORAL_TABLET | ORAL | Status: DC | PRN

## 2013-07-08 MED ORDER — DOCUSATE SODIUM 100 MG PO CAPS: 100.0000 mg | ORAL_CAPSULE | Freq: Two times a day (BID) | ORAL | Status: DC

## 2013-07-08 NOTE — Progress Notes (Signed)
1 Day Post-Op Procedure([name]) (LRB): LAPAROSCOPIC ASSISTED VAGINAL HYSTERECTOMY (N/A)  Subjective: Patient reports tolerating PO and no problems voiding.    Objective: I have reviewed patient'[name] vital signs and labs.  General: alert GI: soft, non-tender; bowel sounds normal; no masses,  no organomegaly and incision: clean Vaginal Bleeding: minimal  Assessment: [name]/p Procedure([name]): LAPAROSCOPIC ASSISTED VAGINAL HYSTERECTOMY (N/A): stable  Plan: Discharge home  LOS: 1 day    Nancy [name] 07/08/2013, 7:49 AM

## 2013-07-08 NOTE — Discharge Summary (Signed)
Nancy [name], FOOR NO.:  [id number]  MEDICAL RECORD NO.:  [id number]  LOCATION:  9310                          [facility]ILITY:  [facility]  PHYSICIAN:  [name], M.D.   DATE OF BIRTH:  Oct 14, 1966  DATE OF ADMISSION:  07/07/2013 DATE OF DISCHARGE:  07/08/2013                              DISCHARGE SUMMARY   ADMITTING DIAGNOSES:  Pelvic pain secondary to adenomyosis plus uterine fibroids.  DISCHARGE DIAGNOSES:  Pelvic pain secondary to adenomyosis plus uterine fibroids.  OPERATIVE PROCEDURES:  Laparoscopic-assisted vaginal hysterectomy.  For complete history and physical, please see dictated note.  COURSE IN THE HOSPITAL:  The patient underwent the above-noted surgery. Postoperatively did well.  Postop hemoglobin was 8.9.  On first postop day, she was tolerating her diet.  Foley had been discontinued.  She was voiding without difficulty.  She was ambulating without difficulty.  She had minimal vaginal spotting.  Incisions were all clear.  Abdomen was soft.  Bowel sounds were active.  Minimal vaginal bleeding.  In terms of complications, none were found during the stay in the hospital.  The patient was discharged home in the stable condition.  DISPOSITION:  The patient was given postop instructions.  She is to avoid heavy lifting, vaginal entrance, or driving a car.  She is going to be discharged home on Percocet on iron sulfate supplementation and Colace.  She was instructed to call should there be heavy vaginal bleeding.  Should report any elevation of her temperature.  Should reports excessive pain.  She also report nausea and vomiting.  Finally, instructed in signs and symptoms of deep venous thrombosis and pulmonary embolus.  She will be called tomorrow and arrange for postop visit.     [name], M.D.     JSM/MEDQ  D:  07/08/2013  T:  07/08/2013  Job:  [id number]

## 2013-07-08 NOTE — Discharge Summary (Signed)
  Patient name Nancy [name], Nancy [name] ZOXWRUEAV#[id number] CSN# [id number]  [facility], MD 07/08/2013 7:52 AM

## 2013-11-05 ENCOUNTER — Ambulatory Visit (INDEPENDENT_AMBULATORY_CARE_PROVIDER_SITE_OTHER): Payer: 59 | Admitting: Emergency Medicine

## 2013-11-05 VITALS — BP 140/88 | HR 82 | Temp 98.2°F | Resp 16 | Ht 62.25 in | Wt 120.6 lb

## 2013-11-05 DIAGNOSIS — G4700 Insomnia, unspecified: Secondary | ICD-10-CM

## 2013-11-05 DIAGNOSIS — F411 Generalized anxiety disorder: Secondary | ICD-10-CM

## 2013-11-05 MED ORDER — PAROXETINE HCL 20 MG PO TABS: 20.0000 mg | ORAL_TABLET | Freq: Every day | ORAL | Status: DC

## 2013-11-05 MED ORDER — TRAZODONE HCL 50 MG PO TABS: 50.0000 mg | ORAL_TABLET | Freq: Every evening | ORAL | Status: DC | PRN

## 2013-11-05 NOTE — Progress Notes (Signed)
[facility] [address], [place] [address] [phone]  Date:  11/05/2013   Name:  Nancy [name]   DOB:  [birth year]   MRN:  [id number]  PCP:  No primary provider on file.    Chief Complaint: Medication Refill, Muscle Pain and Insomnia   History of Present Illness:  Nancy [name] is a 47 y.o. very pleasant female patient who presents with the following:  Seen by Dr Audria Nine for anxiety and insomnia previously.  Treated with clonazepam and has not found that effective in managing her symptoms.  She characteristically retires at 1000 and awakens after midnite and lays awake until 0400 where she falls asleep only to have the alarm wake her up hours later.  She is moderately stressed due to work pressures and finds her shoulders and back of her neck painful.  Has no radiation of pain or neuro symptoms.  No history of injury or overuse.  No improvement with over the counter medications or other home remedies. Denies other complaint or health concern today.   Patient Active Problem List   Diagnosis Date Noted  . Status post laparoscopic assisted vaginal hysterectomy (LAVH) 07/07/2013    Class: Status post  . HTN (hypertension) 11/03/2012  . Dyspnea 07/31/2012  . Hyperlipidemia 07/30/2012  . Depression 07/30/2012    Past Medical History  Diagnosis Date  . Anxiety   . Hyperlipidemia   . Hypertension     Past Surgical History  Procedure Laterality Date  . Tubal ligation      bilateral  . Tonsillectomy    . Diagnostic laparoscopy    . Endometrial ablation w/ novasure    . Laparoscopic assisted vaginal hysterectomy N/A 07/07/2013    Procedure: LAPAROSCOPIC ASSISTED VAGINAL HYSTERECTOMY;  Surgeon: [name], MD;  Location: [facility];  Service: Gynecology;  Laterality: N/A;  . Abdominal hysterectomy      History  Substance Use Topics  . Smoking status: Never Smoker   . Smokeless tobacco: Not on file  . Alcohol Use: 0.6 oz/week    1 Glasses of wine per  week    Family History  Problem Relation Age of Onset  . Coronary artery disease Father   . Hypertension Father   . Heart disease Paternal Uncle   . Heart attack Paternal Uncle   . Stroke Father   . Heart attack Father     No Known Allergies  Medication list has been reviewed and updated.  Current Outpatient Prescriptions on File Prior to Visit  Medication Sig Dispense Refill  . atorvastatin (LIPITOR) 40 MG tablet Take 1 tablet (40 mg total) by mouth daily.  30 tablet  3  . clonazePAM (KLONOPIN) 0.5 MG tablet Take 1 tablet (0.5 mg total) by mouth 2 (two) times daily as needed for anxiety.  30 tablet  1  . hydrochlorothiazide (HYDRODIURIL) 25 MG tablet Take 1 tablet (25 mg total) by mouth daily.  30 tablet  5  . ibuprofen (ADVIL,MOTRIN) 200 MG tablet Take 400 mg by mouth every 6 (six) hours as needed for pain.      . Multiple Vitamin (MULTIVITAMIN WITH MINERALS) TABS tablet Take 1 tablet by mouth daily.      . potassium chloride SA (K-DUR,KLOR-CON) 20 MEQ tablet Take 1 tablet (20 mEq total) by mouth daily.  30 tablet  5   No current facility-administered medications on file prior to visit.    Review of Systems:  As per HPI, otherwise negative.  Physical Examination: Filed Vitals:   11/05/13 0939  BP: 140/88  Pulse: 82  Temp: 98.2 F (36.8 C)  Resp: 16   Filed Vitals:   11/05/13 0939  Height: 5' 2.25" (1.581 m)  Weight: 120 lb 9.6 oz (54.704 kg)   Body mass index is 21.89 kg/(m^2). Ideal Body Weight: Weight in (lb) to have BMI = 25: 137.5   GEN: WDWN, NAD, Non-toxic, Alert & Oriented x 3 HEENT: Atraumatic, Normocephalic.  Ears and Nose: No external deformity. EXTR: No clubbing/cyanosis/edema NEURO: Normal gait.  PSYCH: Normally interactive. Conversant. Not depressed or anxious appearing.  Calm demeanor.    Assessment and Plan: Insomnia Anxiety Trazodone for sleep paxil for anxiety Follow up with Dr Audria Nine in one month.   Signed,  Phillips Odor, MD

## 2013-11-05 NOTE — Patient Instructions (Signed)
Insomnia Insomnia is frequent trouble falling and/or staying asleep. Insomnia can be a long term problem or a short term problem. Both are common. Insomnia can be a short term problem when the wakefulness is related to a certain stress or worry. Long term insomnia is often related to ongoing stress during waking hours and/or poor sleeping habits. Overtime, sleep deprivation itself can make the problem worse. Every little thing feels more severe because you are overtired and your ability to cope is decreased. CAUSES   Stress, anxiety, and depression.  Poor sleeping habits.  Distractions such as TV in the bedroom.  Naps close to bedtime.  Engaging in emotionally charged conversations before bed.  Technical reading before sleep.  Alcohol and other sedatives. They may make the problem worse. They can hurt normal sleep patterns and normal dream activity.  Stimulants such as caffeine for several hours prior to bedtime.  Pain syndromes and shortness of breath can cause insomnia.  Exercise late at night.  Changing time zones may cause sleeping problems (jet lag). It is sometimes helpful to have someone observe your sleeping patterns. They should look for periods of not breathing during the night (sleep apnea). They should also look to see how long those periods last. If you live alone or observers are uncertain, you can also be observed at a sleep clinic where your sleep patterns will be professionally monitored. Sleep apnea requires a checkup and treatment. Give your caregivers your medical history. Give your caregivers observations your family has made about your sleep.  SYMPTOMS   Not feeling rested in the morning.  Anxiety and restlessness at bedtime.  Difficulty falling and staying asleep. TREATMENT   Your caregiver may prescribe treatment for an underlying medical disorders. Your caregiver can give advice or help if you are using alcohol or other drugs for self-medication. Treatment  of underlying problems will usually eliminate insomnia problems.  Medications can be prescribed for short time use. They are generally not recommended for lengthy use.  Over-the-counter sleep medicines are not recommended for lengthy use. They can be habit forming.  You can promote easier sleeping by making lifestyle changes such as:  Using relaxation techniques that help with breathing and reduce muscle tension.  Exercising earlier in the day.  Changing your diet and the time of your last meal. No night time snacks.  Establish a regular time to go to bed.  Counseling can help with stressful problems and worry.  Soothing music and white noise may be helpful if there are background noises you cannot remove.  Stop tedious detailed work at least one hour before bedtime. HOME CARE INSTRUCTIONS   Keep a diary. Inform your caregiver about your progress. This includes any medication side effects. See your caregiver regularly. Take note of:  Times when you are asleep.  Times when you are awake during the night.  The quality of your sleep.  How you feel the next day. This information will help your caregiver care for you.  Get out of bed if you are still awake after 15 minutes. Read or do some quiet activity. Keep the lights down. Wait until you feel sleepy and go back to bed.  Keep regular sleeping and waking hours. Avoid naps.  Exercise regularly.  Avoid distractions at bedtime. Distractions include watching television or engaging in any intense or detailed activity like attempting to balance the household checkbook.  Develop a bedtime ritual. Keep a familiar routine of bathing, brushing your teeth, climbing into bed at the same   time each night, listening to soothing music. Routines increase the success of falling to sleep faster.  Use relaxation techniques. This can be using breathing and muscle tension release routines. It can also include visualizing peaceful scenes. You can  also help control troubling or intruding thoughts by keeping your mind occupied with boring or repetitive thoughts like the old concept of counting sheep. You can make it more creative like imagining planting one beautiful flower after another in your backyard garden.  During your day, work to eliminate stress. When this is not possible use some of the previous suggestions to help reduce the anxiety that accompanies stressful situations. MAKE SURE YOU:   Understand these instructions.  Will watch your condition.  Will get help right away if you are not doing well or get worse. Document Released: 11/16/2000 Document Revised: 02/11/2012 Document Reviewed: 12/17/2007 ExitCare Patient Information 2014 [employer]. Generalized Anxiety Disorder Generalized anxiety disorder (GAD) is a mental disorder. It interferes with life functions, including relationships, work, and school. GAD is different from normal anxiety, which everyone experiences at some point in their lives in response to specific life events and activities. Normal anxiety actually helps us prepare for and get through these life events and activities. Normal anxiety goes away after the event or activity is over.  GAD causes anxiety that is not necessarily related to specific events or activities. It also causes excess anxiety in proportion to specific events or activities. The anxiety associated with GAD is also difficult to control. GAD can vary from mild to severe. People with severe GAD can have intense waves of anxiety with physical symptoms (panic attacks).  SYMPTOMS The anxiety and worry associated with GAD are difficult to control. This anxiety and worry are related to many life events and activities and also occur more days than not for 6 months or longer. People with GAD also have three or more of the following symptoms (one or more in children):  Restlessness.   Fatigue.  Difficulty concentrating.    Irritability.  Muscle tension.  Difficulty sleeping or unsatisfying sleep. DIAGNOSIS GAD is diagnosed through an assessment by your caregiver. Your caregiver will ask you questions aboutyour mood,physical symptoms, and events in your life. Your caregiver may ask you about your medical history and use of alcohol or drugs, including prescription medications. Your caregiver may also do a physical exam and blood tests. Certain medical conditions and the use of certain substances can cause symptoms similar to those associated with GAD. Your caregiver may refer you to a mental health specialist for further evaluation. TREATMENT The following therapies are usually used to treat GAD:   Medication Antidepressant medication usually is prescribed for long-term daily control. Antianxiety medications may be added in severe cases, especially when panic attacks occur.   Talk therapy (psychotherapy) Certain types of talk therapy can be helpful in treating GAD by providing support, education, and guidance. A form of talk therapy called cognitive behavioral therapy can teach you healthy ways to think about and react to daily life events and activities.  Stress managementtechniques These include yoga, meditation, and exercise and can be very helpful when they are practiced regularly. A mental health specialist can help determine which treatment is best for you. Some people see improvement with one therapy. However, other people require a combination of therapies. Document Released: 03/16/2013 Document Reviewed: 03/16/2013 Exit[employer] Patient Information 2014 [employer].  

## 2014-03-02 ENCOUNTER — Ambulatory Visit (INDEPENDENT_AMBULATORY_CARE_PROVIDER_SITE_OTHER): Payer: 59 | Admitting: Family Medicine

## 2014-03-02 ENCOUNTER — Encounter: Payer: Self-pay | Admitting: Family Medicine

## 2014-03-02 VITALS — BP 136/80 | HR 72 | Temp 98.3°F | Resp 16 | Ht 62.5 in | Wt 118.2 lb

## 2014-03-02 DIAGNOSIS — F32A Depression, unspecified: Secondary | ICD-10-CM

## 2014-03-02 DIAGNOSIS — I10 Essential (primary) hypertension: Secondary | ICD-10-CM

## 2014-03-02 DIAGNOSIS — Z0000 Encounter for general adult medical examination without abnormal findings: Secondary | ICD-10-CM

## 2014-03-02 DIAGNOSIS — F329 Major depressive disorder, single episode, unspecified: Secondary | ICD-10-CM

## 2014-03-02 DIAGNOSIS — M2560 Stiffness of unspecified joint, not elsewhere classified: Secondary | ICD-10-CM

## 2014-03-02 LAB — CBC WITH DIFFERENTIAL/PLATELET
BASOS PCT: 1 % (ref 0–1)
Basophils Absolute: 0.1 10*3/uL (ref 0.0–0.1)
EOS ABS: 0.1 10*3/uL (ref 0.0–0.7)
EOS PCT: 1 % (ref 0–5)
HCT: 36.6 % (ref 36.0–46.0)
HEMOGLOBIN: 12.2 g/dL (ref 12.0–15.0)
LYMPHS ABS: 2 10*3/uL (ref 0.7–4.0)
Lymphocytes Relative: 31 % (ref 12–46)
MCH: 27.9 pg (ref 26.0–34.0)
MCHC: 33.3 g/dL (ref 30.0–36.0)
MCV: 83.8 fL (ref 78.0–100.0)
MONO ABS: 0.3 10*3/uL (ref 0.1–1.0)
MONOS PCT: 5 % (ref 3–12)
NEUTROS PCT: 62 % (ref 43–77)
Neutro Abs: 4 10*3/uL (ref 1.7–7.7)
Platelets: 272 10*3/uL (ref 150–400)
RBC: 4.37 MIL/uL (ref 3.87–5.11)
RDW: 13.4 % (ref 11.5–15.5)
WBC: 6.5 10*3/uL (ref 4.0–10.5)

## 2014-03-02 LAB — POCT URINALYSIS DIPSTICK
BILIRUBIN UA: NEGATIVE
GLUCOSE UA: NEGATIVE
KETONES UA: NEGATIVE
NITRITE UA: NEGATIVE
Protein, UA: NEGATIVE
Spec Grav, UA: >=1.03
Urobilinogen, UA: 4
pH, UA: 6

## 2014-03-02 LAB — THYROID PANEL WITH TSH
FREE THYROXINE INDEX: 2.6 (ref 1.0–3.9)
T3 Uptake: 32.4 % (ref 22.5–37.0)
T4 TOTAL: 8 ug/dL (ref 5.0–12.5)
TSH: 1.9 u[IU]/mL (ref 0.350–4.500)

## 2014-03-02 LAB — COMPLETE METABOLIC PANEL WITH GFR
ALBUMIN: 4 g/dL (ref 3.5–5.2)
ALK PHOS: 41 U/L (ref 39–117)
AST: 10 U/L (ref 0–37)
BILIRUBIN TOTAL: 0.9 mg/dL (ref 0.2–1.2)
BUN: 10 mg/dL (ref 6–23)
CO2: 27 meq/L (ref 19–32)
Calcium: 9.2 mg/dL (ref 8.4–10.5)
Chloride: 106 mEq/L (ref 96–112)
Creat: 0.68 mg/dL (ref 0.50–1.10)
GFR, Est African American: >89 mL/min
GLUCOSE: 81 mg/dL (ref 70–99)
POTASSIUM: 4.6 meq/L (ref 3.5–5.3)
SODIUM: 139 meq/L (ref 135–145)
TOTAL PROTEIN: 6.7 g/dL (ref 6.0–8.3)

## 2014-03-02 LAB — LIPID PANEL
Cholesterol: 183 mg/dL (ref 0–200)
HDL: 60 mg/dL (ref >39)
LDL CALC: 115 mg/dL — AB (ref 0–99)
Total CHOL/HDL Ratio: 3.1 Ratio
Triglycerides: 42 mg/dL (ref <150)
VLDL: 8 mg/dL (ref 0–40)

## 2014-03-02 LAB — VITAMIN D 25 HYDROXY (VIT D DEFICIENCY, FRACTURES): Vit D, 25-Hydroxy: 10 ng/mL — ABNORMAL LOW (ref 30–89)

## 2014-03-02 MED ORDER — CLONAZEPAM 0.5 MG PO TABS: 0.5000 mg | ORAL_TABLET | Freq: Two times a day (BID) | ORAL | Status: DC | PRN

## 2014-03-02 MED ORDER — PAROXETINE HCL 20 MG PO TABS: 20.0000 mg | ORAL_TABLET | Freq: Every day | ORAL | Status: DC

## 2014-03-02 MED ORDER — TRAZODONE HCL 100 MG PO TABS: ORAL_TABLET | ORAL | Status: DC

## 2014-03-02 NOTE — Patient Instructions (Signed)
Keeping You Healthy  Get These Tests 1. Blood Pressure- Have your blood pressure checked once a year by your health care provider.  Normal blood pressure is 120/80. 2. Weight- Have your body mass index (BMI) calculated to screen for obesity.  BMI is measure of body fat based on height and weight.  You can also calculate your own BMI at GravelBags.it. 3. Cholesterol- Have your cholesterol checked every 5 years starting at age 48 then yearly starting at age 55. 59. Chlamydia, HIV, and other sexually transmitted diseases- Get screened every year until age 58, then within three months of each new sexual provider. 5. Pap Smear- Every 1-3 years; discuss with your health care provider. 6. Mammogram- Every year starting at age 84  Take these medicines  Calcium with Vitamin D-Your body needs 1200 mg of Calcium each day and [phone] IU of Vitamin D daily.  Your body can only absorb 500 mg of Calcium at a time so Calcium must be taken in 2 or 3 divided doses throughout the day.  Multivitamin with folic acid- Once daily if it is possible for you to become pregnant.  Get these Immunizations  Gardasil-Series of three doses; prevents HPV related illness such as genital warts and cervical cancer.  Menactra-Single dose; prevents meningitis.  Tetanus shot- Every 10 years.  Flu shot-Every year.  Take these steps 1. Do not smoke-Your healthcare provider can help you quit.  For tips on how to quit go to www.smokefree.gov or call 1-800 QUITNOW. 2. Be physically active- Exercise 5 days a week for at least 30 minutes.  If you are not already physically active, start slow and gradually work up to 30 minutes of moderate physical activity.  Examples of moderate activity include walking briskly, dancing, swimming, bicycling, etc. 3. Breast Cancer- A self breast exam every month is important for early detection of breast cancer.  For more information and instruction on self breast exams, ask your  healthcare provider or https://www.patel.info/. 4. Eat a healthy diet- Eat a variety of healthy foods such as fruits, vegetables, whole grains, low fat milk, low fat cheeses, yogurt, lean meats, poultry and fish, beans, nuts, tofu, etc.  For more information go to www. Thenutritionsource.org 5. Drink alcohol in moderation- Limit alcohol intake to one drink or less per day. Never drink and drive. 6. Depression- Your emotional health is as important as your physical health.  If you're feeling down or losing interest in things you normally enjoy please talk to your healthcare provider about being screened for depression. 7. Dental visit- Brush and floss your teeth twice daily; visit your dentist twice a year. 8. Eye doctor- Get an eye exam at least every 2 years. 9. Helmet use- Always wear a helmet when riding a bicycle, motorcycle, rollerblading or skateboarding. 76. Safe sex- If you may be exposed to sexually transmitted infections, use a condom. 11. Seat belts- Seat belts can save your live; always wear one. 12. Smoke/Carbon Monoxide detectors- These detectors need to be installed on the appropriate level of your home. Replace batteries at least once a year. 13. Skin cancer- When out in the sun please cover up and use sunscreen 15 SPF or higher. 14. Violence- If anyone is threatening or hurting you, please tell your healthcare provider.  Vitamin D Deficiency Vitamin D is an important vitamin that your body needs. Having too little of it in your body is called a deficiency. A very bad deficiency can make your bones soft and can cause a condition  called rickets.  Vitamin D is important to your body for different reasons, such as:   It helps your body absorb 2 minerals called calcium and phosphorus.  It helps make your bones healthy.  It may prevent some diseases, such as diabetes and multiple sclerosis.  It helps your muscles and heart. You can get vitamin D in several  ways. It is a natural part of some foods. The vitamin is also added to some dairy products and cereals. Some people take vitamin D supplements. Also, your body makes vitamin D when you are in the sun. It changes the sun's rays into a form of the vitamin that your body can use. CAUSES   Not eating enough foods that contain vitamin D.  Not getting enough sunlight.  Having certain digestive system diseases that make it hard to absorb vitamin D. These diseases include Crohn's disease, chronic pancreatitis, and cystic fibrosis.  Having a surgery in which part of the stomach or small intestine is removed.  Being obese. Fat cells pull vitamin D out of your blood. That means that obese people may not have enough vitamin D left in their blood and in other body tissues.  Having chronic kidney or liver disease. RISK FACTORS Risk factors are things that make you more likely to develop a vitamin D deficiency. They include:  Being older.  Not being able to get outside very much.  Living in a nursing home.  Having had broken bones.  Having weak or thin bones (osteoporosis).  Having a disease or condition that changes how your body absorbs vitamin D.  Having dark skin.  Some medicines such as seizure medicines or steroids.  Being overweight or obese. SYMPTOMS Mild cases of vitamin D deficiency may not have any symptoms. If you have a very bad case, symptoms may include:  Bone pain.  Muscle pain.  Falling often.  Broken bones caused by a minor injury, due to osteoporosis. DIAGNOSIS A blood test is the best way to tell if you have a vitamin D deficiency. TREATMENT Vitamin D deficiency can be treated in different ways. Treatment for vitamin D deficiency depends on what is causing it. Options include:  Taking vitamin D supplements.  Taking a calcium supplement. Your caregiver will suggest what dose is best for you. HOME CARE INSTRUCTIONS  Take any supplements that your caregiver  prescribes. Follow the directions carefully. Take only the suggested amount.  Have your blood tested 2 months after you start taking supplements.  Eat foods that contain vitamin D. Healthy choices include:  Fortified dairy products, cereals, or juices. Fortified means vitamin D has been added to the food. Check the label on the package to be sure.  Fatty fish like salmon or trout.  Eggs.  Oysters.  Do not use a tanning bed.  Keep your weight at a healthy level. Lose weight if you need to.  Keep all follow-up appointments. Your caregiver will need to perform blood tests to make sure your vitamin D deficiency is going away. SEEK MEDICAL CARE IF:  You have any questions about your treatment.  You continue to have symptoms of vitamin D deficiency.  You have nausea or vomiting.  You are constipated.  You feel confused.  You have severe abdominal or back pain. MAKE SURE YOU:  Understand these instructions.  Will watch your condition.  Will get help right away if you are not doing well or get worse. Document Released: 02/11/2012 Document Revised: 03/16/2013 Document Reviewed: 02/11/2012 ExitCare Patient Information  2014 [place], [place].

## 2014-03-02 NOTE — Progress Notes (Signed)
Subjective:    Patient ID: Nancy [name], female    DOB: 07/10/1966, 48 y.o.   MRN: [id number]  HPI  This 48 y.o. AA female is here for CPE; PAP/pelvic per GYN. Pt has HTN, well controlled on current medication w/o adverse effects. She has chronic anxiety w/ sleep disturbance. Trazodone not effective; Paroxetine and prn Clonazepam help reduce anxiety.  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 She finds this less stressful.  HCM: PAP- June 2014 (negative).           MMG- June 2014 9Negative).           CRS- Normal per pt.           DEXA- Abnormal per pt.  Patient Active Problem List   Diagnosis Date Noted  . Status post laparoscopic assisted vaginal hysterectomy (LAVH) 07/07/2013    Class: Status post  . HTN (hypertension) 11/03/2012  . Dyspnea 07/31/2012  . Hyperlipidemia 07/30/2012  . Depression 07/30/2012   Prior to Admission medications   Medication Sig Start Date End Date Taking? Authorizing Provider  clonazePAM (KLONOPIN) 0.5 MG tablet Take 1 tablet (0.5 mg total) by mouth 2 (two) times daily as needed for anxiety. 05/08/13  Yes Barton Fanny, MD  ibuprofen (ADVIL,MOTRIN) 200 MG tablet Take 400 mg by mouth every 6 (six) hours as needed for pain.   Yes Historical Provider, MD  Multiple Vitamin (MULTIVITAMIN WITH MINERALS) TABS tablet Take 1 tablet by mouth daily.   Yes Historical Provider, MD  PARoxetine (PAXIL) 20 MG tablet Take 1 tablet (20 mg total) by mouth daily. 11/05/13  Yes Ellison Carwin, MD  traZODone (DESYREL) 50 MG tablet Take 1 tablet (50 mg total) by mouth at bedtime as needed for sleep. 11/05/13  Yes Ellison Carwin, MD  atorvastatin (LIPITOR) 40 MG tablet Take 1 tablet (40 mg total) by mouth daily. 02/09/13   Thayer Headings, MD  hydrochlorothiazide (HYDRODIURIL) 25 MG tablet Take 1 tablet (25 mg total) by mouth daily. 02/09/13   Thayer Headings, MD  potassium chloride SA (K-DUR,KLOR-CON) 20 MEQ tablet Take 1 tablet (20 mEq  total) by mouth daily. 02/09/13   Thayer Headings, MD   PMHx, Surg Hx, Soc and Fam Hx reviewed.   Review of Systems  Constitutional: Positive for diaphoresis, appetite change and unexpected weight change.  HENT: Negative.   Eyes: Negative.   Respiratory: Negative.   Cardiovascular: Negative.   Gastrointestinal: Negative.   Endocrine: Negative.   Genitourinary: Negative.   Musculoskeletal: Positive for back pain, myalgias and neck stiffness.       Tailbone aches w/ prolonged sitting; muscle aches and neck stiffness.  Skin: Negative.   Allergic/Immunologic: Negative.   Neurological: Negative.   Hematological: Negative.   Psychiatric/Behavioral: Negative.        Objective:   Physical Exam  Nursing note and vitals reviewed. Constitutional: She is oriented to person, place, and time. Vital signs are normal. She appears well-developed and well-nourished.  HENT:  Head: Normocephalic and atraumatic.  Right Ear: Hearing, tympanic membrane and ear canal normal.  Left Ear: Hearing, tympanic membrane, external ear and ear canal normal.  Nose: Nose normal. No mucosal edema, nasal deformity or septal deviation.  Mouth/Throat: Uvula is midline, oropharynx is clear and moist and mucous membranes are normal. No oral lesions. Normal dentition. No dental caries.  Eyes: Conjunctivae, EOM and lids are normal. Pupils are equal, round, and reactive to light. No  scleral icterus.  Neck: Trachea normal, normal range of motion and full passive range of motion without pain. Neck supple. No JVD present. No spinous process tenderness and no muscular tenderness present. No mass and no thyromegaly present.  Cardiovascular: Normal rate, regular rhythm, S1 normal, S2 normal, normal heart sounds, intact distal pulses and normal pulses.   No extrasystoles are present. PMI is not displaced.  Exam reveals no gallop and no friction rub.   No murmur heard. Pulmonary/Chest: Effort normal and breath sounds normal. No  respiratory distress.  Abdominal: Soft. Normal appearance and bowel sounds are normal. She exhibits no distension and no mass. There is no hepatosplenomegaly. There is no tenderness. There is no guarding and no CVA tenderness.  Genitourinary:  Deferred.  Musculoskeletal:       Cervical back: Normal.       Thoracic back: Normal.       Lumbar back: She exhibits tenderness. She exhibits normal range of motion, no bony tenderness, no deformity, no pain and no spasm.  Remainder of exam unremarkable.  Lymphadenopathy:       Head (right side): No submental, no submandibular, no tonsillar, no preauricular, no posterior auricular and no occipital adenopathy present.       Head (left side): No submental, no submandibular, no tonsillar, no preauricular and no occipital adenopathy present.    She has no cervical adenopathy.       Right: No inguinal and no supraclavicular adenopathy present.       Left: No inguinal and no supraclavicular adenopathy present.  Neurological: She is alert and oriented to person, place, and time. She has normal strength and normal reflexes. She displays no atrophy. No cranial nerve deficit or sensory deficit. She exhibits normal muscle tone. She displays a negative Romberg sign. Coordination and gait normal.  Skin: Skin is warm, dry and intact. No bruising, no lesion and no rash noted. She is not diaphoretic. No cyanosis or erythema. Nails show no clubbing.  Psychiatric: She has a normal mood and affect. Her speech is normal and behavior is normal. Judgment and thought content normal. Cognition and memory are normal.    Results for orders placed in visit on 03/02/14  POCT URINALYSIS DIPSTICK      Result Value Ref Range   Color, UA yellow     Clarity, UA clear     Glucose, UA neg     Bilirubin, UA neg     Ketones, UA neg     Spec Grav, UA >=1.030     Blood, UA trace     pH, UA 6.0     Protein, UA neg     Urobilinogen, UA 4.0     Nitrite, UA neg     Leukocytes, UA  Trace         Assessment & Plan:  Routine general medical examination at a health care facility - Plan: POCT urinalysis dipstick, Thyroid Panel With TSH, Lipid panel, COMPLETE METABOLIC PANEL WITH GFR, Vitamin D, 25-hydroxy, CBC with Differential, clonazePAM (KLONOPIN) 0.5 MG tablet, DISCONTINUED: clonazePAM (KLONOPIN) 0.5 MG tablet  HTN (hypertension) - Stabel on current medications. Plan: COMPLETE METABOLIC PANEL WITH GFR, CBC with Differential  Depression- Continue Paroxetine; increase Trazodone to 10 mg hs.  Joint stiffness- Advised Vitamin D supplementation in addition to MVI daily.  Meds ordered this encounter  Medications  . DISCONTD: clonazePAM (KLONOPIN) 0.5 MG tablet    Sig: Take 1 tablet (0.5 mg total) by mouth 2 (two) times daily as needed  for anxiety.    Dispense:  30 tablet    Refill:  1  . clonazePAM (KLONOPIN) 0.5 MG tablet    Sig: Take 1 tablet (0.5 mg total) by mouth 2 (two) times daily as needed for anxiety.    Dispense:  30 tablet    Refill:  1  . traZODone (DESYREL) 100 MG tablet    Sig: Take 1 tablet by mouth at bedtime.    Dispense:  30 tablet    Refill:  3  . PARoxetine (PAXIL) 20 MG tablet    Sig: Take 1 tablet (20 mg total) by mouth daily.    Dispense:  30 tablet    Refill:  5

## 2014-03-03 ENCOUNTER — Other Ambulatory Visit: Payer: Self-pay | Admitting: Family Medicine

## 2014-03-03 MED ORDER — CHOLECALCIFEROL 1.25 MG (50000 UT) PO CAPS: ORAL_CAPSULE | ORAL | Status: DC

## 2014-03-03 NOTE — Progress Notes (Signed)
Quick Note:  Please advise pt regarding following labs... All labs are normal except Vitamin D is very low. I am prescribing a supplement that you need to take once a week; it is a high dose Vitamin D supplement (50000 units per capsule). You will need to take this supplement for several months. Once your levels are up in the normal range, you should be able to notice a difference in how you feel.  Copy to pt. ______

## 2014-04-14 ENCOUNTER — Ambulatory Visit (INDEPENDENT_AMBULATORY_CARE_PROVIDER_SITE_OTHER): Payer: 59 | Admitting: Family Medicine

## 2014-04-14 ENCOUNTER — Encounter: Payer: Self-pay | Admitting: Family Medicine

## 2014-04-14 VITALS — BP 110/70 | HR 69 | Temp 98.2°F | Resp 16 | Ht 63.0 in | Wt 116.0 lb

## 2014-04-14 DIAGNOSIS — F32A Depression, unspecified: Secondary | ICD-10-CM

## 2014-04-14 DIAGNOSIS — L959 Vasculitis limited to the skin, unspecified: Secondary | ICD-10-CM

## 2014-04-14 DIAGNOSIS — E559 Vitamin D deficiency, unspecified: Secondary | ICD-10-CM

## 2014-04-14 DIAGNOSIS — F329 Major depressive disorder, single episode, unspecified: Secondary | ICD-10-CM

## 2014-04-14 DIAGNOSIS — L988 Other specified disorders of the skin and subcutaneous tissue: Secondary | ICD-10-CM

## 2014-04-14 MED ORDER — CLONAZEPAM 0.5 MG PO TABS: 0.5000 mg | ORAL_TABLET | Freq: Two times a day (BID) | ORAL | Status: DC | PRN

## 2014-04-14 NOTE — Progress Notes (Signed)
S:  This 48 y.o. AA female presents w/ 2-week hx of swollen painful rope-like area under R upper arm. Her husband first noticed the area; it seems t have decreased in size and pain in last few days. Pt denies fever/ chills, fatigue, decreased appetite, weight loss, sore throat, swollen lymph nodes, cough, SOB, myalgias, rashes, HA, dizziness or weakness. She denies trauma and does not exercise. She has a dog but does not recall animal or insect bite.  Patient Active Problem List   Diagnosis Date Noted  . Status post laparoscopic assisted vaginal hysterectomy (LAVH) 07/07/2013    Class: Status post  . HTN (hypertension) 11/03/2012  . Dyspnea 07/31/2012  . Hyperlipidemia 07/30/2012  . Depression 07/30/2012    Prior to Admission medications   Medication Sig Start Date End Date Taking? Authorizing Provider  atorvastatin (LIPITOR) 40 MG tablet Take 1 tablet (40 mg total) by mouth daily. 02/09/13  Yes Thayer Headings, MD  Cholecalciferol (D3-50) 50000 UNITS capsule Take 1 capsule by mouth once a week. 03/03/14  Yes Barton Fanny, MD  clonazePAM (KLONOPIN) 0.5 MG tablet Take 1 tablet (0.5 mg total) by mouth 2 (two) times daily as needed for anxiety. 03/02/14  Yes Barton Fanny, MD  hydrochlorothiazide (HYDRODIURIL) 25 MG tablet Take 1 tablet (25 mg total) by mouth daily. 02/09/13  Yes Thayer Headings, MD  ibuprofen (ADVIL,MOTRIN) 200 MG tablet Take 400 mg by mouth every 6 (six) hours as needed for pain.   Yes Historical Provider, MD  PARoxetine (PAXIL) 20 MG tablet Take 1 tablet (20 mg total) by mouth daily. 03/02/14  Yes Barton Fanny, MD  potassium chloride SA (K-DUR,KLOR-CON) 20 MEQ tablet Take 1 tablet (20 mEq total) by mouth daily. 02/09/13  Yes Thayer Headings, MD  traZODone (DESYREL) 100 MG tablet Take 1 tablet by mouth at bedtime. 03/02/14  Yes Barton Fanny, MD  Multiple Vitamin (MULTIVITAMIN WITH MINERALS) TABS tablet Take 1 tablet by mouth daily.    Historical Provider, MD    PMHx, Surg Hx, Soc and Fam Hx reviewed.  ROS: As per HPI.  O: Filed Vitals:   04/14/14 0904  BP: 110/70  Pulse: 69  Temp: 98.2 F (36.8 C)  Resp: 16   GEN: in NAD; WN,WD. HENT: [place]/AT; EOMI w/ clear conj/sclerae. Ext ears normal. Nose and oroph clear. NECK: Supple w/o LAN. COR: RRR. LUNGS: Unlabored resp. MS: R upper arm- tender palpable cord-like lesion on medial aspect; skin restraction overlying affected area. No axillary nodes or masses palpable. SKIN: W&D; intact w/o diaphoresis, rashes or erythema. NEURO: A&O x 3; CNs intact. Nonfocal.  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 Plan: Upper extremity Venous Duplex Right  Unspecified vitamin D deficiency- Continue supplement once a week.  Meds ordered this encounter  Medications  . clonazePAM (KLONOPIN) 0.5 MG tablet    Sig: Take 1 tablet (0.5 mg total) by mouth 2 (two) times daily as needed for anxiety.    Dispense:  30 tablet    Refill:  1

## 2014-04-14 NOTE — Patient Instructions (Signed)
Vasculitis Vasculitis is when your blood vessels are inflamed. There are many different blood vessels in the body, and vasculitis can affect any of them. This includes large (veins and arteries) and small (capillaries) vessels. With vasculitis,   Blood vessel walls can become thick.  Blood vessels can become narrow.  Blood vessels can become weak. Sometimes, it becomes so weak that the blood vessel bulges out like a balloon. This is called an aneurysm. Aneurysms are rare but can be life-threatening.  Scarring can occur.  Not enough blood can flow through the blood vessels. All of these things can damage many parts of the body, including the muscles, kidneys, lungs and brain. There are many types of vasculitis. Some types are short-term (acute), while others are long-term (chronic). Some types may go away without treatment, and others may need to be treated for a long time.  CAUSES  Vasculitis occurs when the body's immune system (which fights germs and disease) makes a mistake. It attacks its own blood vessels. This causes inflammation (the body's way of reacting to injury or infection).   Why this happens is usually not known. The condition is then called primary vasculitis.  Sometimes, something triggers the inflammation. This is called secondary vasculitis. Possible causes include:  Infections.  An immune system disease. Examples include lupus, rheumatoid arthritis and scleroderma.  An allergic reaction to a medicine.  Cancer that affects blood cells. This includes leukemia and lymphoma.  Males and females of all ages and races can develop vasculitis. Some risk factors make vasculitis more likely. These include:  Smoking.  Stress.  Physical injury. SYMPTOMS  There are more than 20 types of vasculitis. Symptoms of each type vary, but some symptoms are common.  Many people with vasculitis:  Have a fever.  Do not feel like eating.  Lose weight.  Feel very tired.  Have  aches and pains.  Feel weak.  Start to not have feeling (numbness) in an area.  Symptoms for some types of vasculitis also could be:  Sores in the mouth or eyes.  Skin problems. This could be sores, spots or rashes.  Trouble seeing.  Trouble breathing.  Blood in the urine.  Headaches.  Pain in the abdomen.  Stuffy or bloody nose. DIAGNOSIS  Vasculitis symptoms are similar to symptoms of many other conditions. That can make it hard to tell if you have vasculitis. To be sure, your caregiver will ask about your symptoms and do a physical exam. Certain tests may be necessary, such as:   A complete blood count (CBC). This test shows how many red blood cells are in your blood. Not having enough red blood cells (anemic) can result from vasculitis.  Erythrocyte sedimentation (also called sed rate test). It measures inflammation in the body.  C reactive protein (CRP). This also shows if there is inflammation.  Anti-neutrophil cytoplasmic antibodies (ANCA). This can tell if the immune system is reacting to certain cells in the blood.  A urine test. This checks for blood or protein in the urine. That could be a sign of kidney damage from vasculitis.  Imaging tests. These tests create pictures from inside the body. Options include:  X-rays.  Computed tomography (CT) scan. This uses X-rays guided by a computer.  Ultrasound. It creates an image using sound waves.  Magnetic resonance imaging (MRI). It uses radio waves, magnets and a computer.  Angiography. A dye is put into your blood vessels. Then, an X-ray is taken of them.  A biopsy of   a blood vessel. This means your caregiver will take out a small piece of a blood vessel. Then, it is checked under a microscope. This is an important test. It often is the best way to know for sure if you have vasculitis. TREATMENT   Treatment will depend on the type of vasculitis and how severe it is. Often, you will need to see a specialist in  immunologic diseases (rheumatologist).  Some types of vasculitis may go away without treatment.  Some types need only over-the-counter drugs.  Prescription medicines are used to treat many types of vasculitis. For example:  Corticosteroids. These are the drugs used most often. They are very powerful. Usually, a high dose is taken until symptoms improve. Then, the dose is gradually decreased. Using corticosteroids for a long time can cause problems. They can make muscles and bones weak. They can cause blood pressure to go up, and cause diabetes. Also, people often gain weight when they take corticosteroids.  Cytotoxic drugs. These kill cells that cause inflammation. Sometimes, they are used if corticosteroids do not help. Other times, both medications are taken.  Surgery. This may be needed to repair a blood vessel that has bulged out (aneurysm).  Treatment can sometimes cure your disease. Other times, it can put the disease in remission (no symptoms). Increased treatment and reevaluation might be necessary if your disease comes back or flares. HOME CARE INSTRUCTIONS   Take any medications that your caregiver prescribes. Follow the directions carefully.  Watch for any problems that can be caused by a drug (side effects). Tell your caregiver right away if you notice any changes or problems.  Keep all appointments for checkups. This is important to help your caregiver watch for side effects. Checkups may include:  Periodic blood tests.  Bone density testing. This checks how strong or weak your bones are.  Blood pressure checks. If your blood pressure rises, you may need to take a drug to control it while you are taking corticosteroids.  Blood sugar checks. This is to be sure you are not developing diabetes. If you have diabetes, corticosteroid medications may make it worse and require increased treatment.  Exercise. First, talk with your caregiver about what would be OK for you to do.  Aerobic exercise (which increases your heart rate) is often suggested. It includes walking. This type of exercise is good because it helps prevent bone loss. It also helps control your blood pressure.  Follow a healthy diet. Include good sources of protein in your diet. Also include fruits, vegetables and whole grains. Your caregiver can refer you to an expert on healthy eating (dietitian) for more detailed advice.  Learn as much as you can about vasculitis. Understanding your condition can help you cope with it. Coping can be hard because this may be something you will have to live with for years.  Consider joining a support group. It often helps to talk about your worries with others who have the same problems.  Tell your caregiver if you feel stressed, anxious or depressed. Your caregiver may refer you to a specialist, or recommend medication to relieve your symptoms. SEEK MEDICAL CARE IF:   The symptoms that led to your diagnosis return.  You develop worsening fever, fatigue, headache, weight loss or pain in your jaw.  You develop signs of infection. Infections can be worse if you are on corticosteroid medication.  You develop any new or unexplained symptoms of disease. SEEK IMMEDIATE MEDICAL CARE IF:   Your eyesight changes.    Pain does not go away, even after taking medication.  You feel pain in your chest or abdomen.  You have trouble breathing.  One side of your face or body becomes suddenly weak or numb.  Your nose bleeds.  There is blood in your urine.  You develop a fever of more than 102 F (38.9 C). Document Released: 09/15/2009 Document Revised: 02/11/2012 Document Reviewed: 09/15/2009 [employer] Patient Information 2014 [employer].  

## 2014-04-22 ENCOUNTER — Encounter (HOSPITAL_COMMUNITY): Payer: 59

## 2014-04-28 ENCOUNTER — Ambulatory Visit (HOSPITAL_COMMUNITY): Payer: 59 | Attending: Cardiovascular Disease | Admitting: *Deleted

## 2014-04-28 DIAGNOSIS — M79609 Pain in unspecified limb: Secondary | ICD-10-CM

## 2014-04-28 DIAGNOSIS — M7989 Other specified soft tissue disorders: Secondary | ICD-10-CM

## 2014-04-28 DIAGNOSIS — I776 Arteritis, unspecified: Secondary | ICD-10-CM | POA: Insufficient documentation

## 2014-04-28 DIAGNOSIS — L959 Vasculitis limited to the skin, unspecified: Secondary | ICD-10-CM

## 2014-04-28 NOTE — Progress Notes (Signed)
Upper extremity venous right.

## 2014-05-04 ENCOUNTER — Telehealth: Payer: Self-pay | Admitting: Radiology

## 2014-05-04 NOTE — Telephone Encounter (Signed)
Called pt to advise, left message for call back

## 2014-05-04 NOTE — Telephone Encounter (Signed)
Message copied by Candice Camp on Tue May 04, 2014  2:05 PM ------      Message from: Ellsworth Lennox B      Created: Mon May 03, 2014  7:34 PM       Please let pt know that her recent vascular study is negative.            Thank you. ------

## 2014-05-06 NOTE — Telephone Encounter (Signed)
Pt returned call in regards to results. Informed pt of results

## 2014-05-18 ENCOUNTER — Ambulatory Visit: Payer: 59 | Admitting: Family Medicine

## 2014-06-01 ENCOUNTER — Ambulatory Visit: Payer: 59 | Admitting: Family Medicine

## 2014-07-05 ENCOUNTER — Other Ambulatory Visit: Payer: Self-pay | Admitting: Family Medicine

## 2014-08-06 ENCOUNTER — Other Ambulatory Visit: Payer: Self-pay | Admitting: Family Medicine

## 2014-09-01 ENCOUNTER — Other Ambulatory Visit: Payer: Self-pay | Admitting: Family Medicine

## 2014-10-18 ENCOUNTER — Other Ambulatory Visit: Payer: Self-pay | Admitting: Family Medicine

## 2014-10-19 NOTE — Telephone Encounter (Signed)
Clonazepam refill phoned to pt's pharmacy. 

## 2014-11-12 ENCOUNTER — Other Ambulatory Visit: Payer: Self-pay | Admitting: Family Medicine

## 2015-03-09 ENCOUNTER — Other Ambulatory Visit: Payer: Self-pay | Admitting: Family Medicine

## 2015-03-10 ENCOUNTER — Other Ambulatory Visit: Payer: Self-pay | Admitting: Physician Assistant

## 2015-03-10 ENCOUNTER — Ambulatory Visit (INDEPENDENT_AMBULATORY_CARE_PROVIDER_SITE_OTHER): Payer: 59 | Admitting: Family Medicine

## 2015-03-10 VITALS — BP 110/66 | HR 73 | Temp 98.2°F | Resp 20 | Ht 63.5 in | Wt 127.5 lb

## 2015-03-10 DIAGNOSIS — S39012A Strain of muscle, fascia and tendon of lower back, initial encounter: Secondary | ICD-10-CM

## 2015-03-10 MED ORDER — METHOCARBAMOL 500 MG PO TABS: 500.0000 mg | ORAL_TABLET | Freq: Four times a day (QID) | ORAL | Status: DC

## 2015-03-10 MED ORDER — HYDROCODONE-ACETAMINOPHEN 5-325 MG PO TABS: 1.0000 | ORAL_TABLET | Freq: Four times a day (QID) | ORAL | Status: DC | PRN

## 2015-03-10 MED ORDER — NAPROXEN 500 MG PO TABS: 500.0000 mg | ORAL_TABLET | Freq: Two times a day (BID) | ORAL | Status: DC

## 2015-03-10 NOTE — Progress Notes (Signed)
Subjective: 48 year old lady who didn't over to unplug the vacuum cleaner cord last night and couldn't stand back up. She has severe low back pain. No radiation. She has been having some diarrhea lately, but no change in bowel or bladder function since the injury. She has not had pains like this in her back before. She works as a Education officer, [employer]. She did try and work today.  Objective: Tender at the SI eats and lumbosacral region. Muscles don't seem to be terribly tight. Flexion can only go forward about 30. It hurts to extend her back. Side to side tilt and truncal rotation are satisfactory. Leg raise test negative. Gait normal.  Assessment: Lumbar strain and pain  Plan: Naproxen 500 twice a day Robaxin 500 4 times a day Hydrocodone if needed for severe pain Return if not improving. No imaging studies done today.

## 2015-03-10 NOTE — Patient Instructions (Signed)
Take methocarbamol 1 pill 4 times daily. Can take 2 pills at bedtime if needed. This is for muscle relaxation  Take naproxen 500 mg 1 twice daily for pain and inflammation and back  Take hydrocodone one every 6 hours if needed for more severe pain  Stay off work tomorrow and try and rest her back but keep moving around some  Read the "back owners manual"  Return if not improving in the next 5-7 days. Sooner if worse.

## 2015-03-11 NOTE — Telephone Encounter (Signed)
At this time, she needs an appointment with her pcp or a provider here, to recheck medical needs for this klonopin, and we will be most happy to assist her.   Patient was seen 03/10/2015 (yesterday) for lumbar strain and given hydrocodone among muscle relaxants and other.  Please ask if she asked for a refill at this visit (not listed on the note).

## 2015-03-11 NOTE — Telephone Encounter (Signed)
Pharm called to check on this RF req. Nancy [name] advised that Dr Leward Quan is not in today yet, and not sure if she will be in office later. Can someone else review this in her absence since it has been a couple of days please?

## 2015-03-13 NOTE — Telephone Encounter (Signed)
I phoned 1 refill for 30 tablets to pt's pharmacy, advising that she will need OV for additional refills.

## 2015-03-20 ENCOUNTER — Other Ambulatory Visit: Payer: Self-pay | Admitting: Family Medicine

## 2015-03-31 ENCOUNTER — Ambulatory Visit (INDEPENDENT_AMBULATORY_CARE_PROVIDER_SITE_OTHER): Payer: 59 | Admitting: Family Medicine

## 2015-03-31 ENCOUNTER — Encounter: Payer: Self-pay | Admitting: Family Medicine

## 2015-03-31 VITALS — BP 130/81 | HR 78 | Temp 98.0°F | Resp 16 | Ht 63.0 in | Wt 128.0 lb

## 2015-03-31 DIAGNOSIS — E785 Hyperlipidemia, unspecified: Secondary | ICD-10-CM | POA: Diagnosis not present

## 2015-03-31 DIAGNOSIS — N951 Menopausal and female climacteric states: Secondary | ICD-10-CM

## 2015-03-31 DIAGNOSIS — I10 Essential (primary) hypertension: Secondary | ICD-10-CM

## 2015-03-31 DIAGNOSIS — F418 Other specified anxiety disorders: Secondary | ICD-10-CM

## 2015-03-31 DIAGNOSIS — E559 Vitamin D deficiency, unspecified: Secondary | ICD-10-CM | POA: Diagnosis not present

## 2015-03-31 DIAGNOSIS — Z0000 Encounter for general adult medical examination without abnormal findings: Secondary | ICD-10-CM | POA: Diagnosis not present

## 2015-03-31 LAB — COMPLETE METABOLIC PANEL WITH GFR
ALK PHOS: 44 U/L (ref 39–117)
ALT: 12 U/L (ref 0–35)
AST: 15 U/L (ref 0–37)
Albumin: 4.4 g/dL (ref 3.5–5.2)
BILIRUBIN TOTAL: 1 mg/dL (ref 0.2–1.2)
BUN: 10 mg/dL (ref 6–23)
CO2: 27 mEq/L (ref 19–32)
CREATININE: 0.69 mg/dL (ref 0.50–1.10)
Calcium: 9.4 mg/dL (ref 8.4–10.5)
Chloride: 103 mEq/L (ref 96–112)
GFR, Est African American: >89 mL/min
GFR, Est Non African American: >89 mL/min
Glucose, Bld: 86 mg/dL (ref 70–99)
Potassium: 4.4 mEq/L (ref 3.5–5.3)
Sodium: 136 mEq/L (ref 135–145)
Total Protein: 7 g/dL (ref 6.0–8.3)

## 2015-03-31 LAB — LIPID PANEL
CHOLESTEROL: 212 mg/dL — AB (ref 0–200)
HDL: 79 mg/dL (ref >=46)
LDL CALC: 118 mg/dL — AB (ref 0–99)
TRIGLYCERIDES: 75 mg/dL (ref <150)
Total CHOL/HDL Ratio: 2.7 Ratio
VLDL: 15 mg/dL (ref 0–40)

## 2015-03-31 LAB — POCT URINALYSIS DIPSTICK
Bilirubin, UA: NEGATIVE
Blood, UA: NEGATIVE
GLUCOSE UA: NEGATIVE
Leukocytes, UA: NEGATIVE
NITRITE UA: NEGATIVE
Protein, UA: NEGATIVE
Spec Grav, UA: 1.02
UROBILINOGEN UA: 1
pH, UA: 6.5

## 2015-03-31 MED ORDER — TRAZODONE HCL 100 MG PO TABS: ORAL_TABLET | ORAL | Status: DC

## 2015-03-31 MED ORDER — VENLAFAXINE HCL ER 37.5 MG PO CP24: ORAL_CAPSULE | ORAL | Status: DC

## 2015-03-31 MED ORDER — CLONAZEPAM 0.5 MG PO TABS: 0.2500 mg | ORAL_TABLET | Freq: Two times a day (BID) | ORAL | Status: DC

## 2015-03-31 NOTE — Patient Instructions (Signed)
Keeping You Healthy  Get These Tests  Blood Pressure- Have your blood pressure checked once a year by your health care provider.  Normal blood pressure is 120/80.  Weight- Have your body mass index (BMI) calculated to screen for obesity.  BMI is measure of body fat based on height and weight.  You can also calculate your own BMI at GravelBags.it.  Cholesterol- Have your cholesterol checked every 5 years starting at age 49 then yearly starting at age 39.  Chlamydia, HIV, and other sexually transmitted diseases- Get screened every year until age 49, then within three months of each new sexual provider.  Pap Smear- Every 1-5 years; discuss with your health care provider.  Mammogram- Every 1-2 years starting at age 4--50  Take these medicines  Calcium with Vitamin D-Your body needs 1200 mg of Calcium each day and [phone] IU of Vitamin D daily.  Your body can only absorb 500 mg of Calcium at a time so Calcium must be taken in 2 or 3 divided doses throughout the day.  Multivitamin with folic acid- Once daily if it is possible for you to become pregnant.  Get these Immunizations  Gardasil-Series of three doses; prevents HPV related illness such as genital warts and cervical cancer.  Menactra-Single dose; prevents meningitis.  Tetanus shot- Every 10 years.  Flu shot-Every year.  Take these steps  Do not smoke-Your healthcare provider can help you quit.  For tips on how to quit go to www.smokefree.gov or call 1-800 QUITNOW.  Be physically active- Exercise 5 days a week for at least 30 minutes.  If you are not already physically active, start slow and gradually work up to 30 minutes of moderate physical activity.  Examples of moderate activity include walking briskly, dancing, swimming, bicycling, etc.  Breast Cancer- A self breast exam every month is important for early detection of breast cancer.  For more information and instruction on self breast exams, ask your  healthcare provider or https://www.patel.info/.  Eat a healthy diet- Eat a variety of healthy foods such as fruits, vegetables, whole grains, low fat milk, low fat cheeses, yogurt, lean meats, poultry and fish, beans, nuts, tofu, etc.  For more information go to www. Thenutritionsource.org  Drink alcohol in moderation- Limit alcohol intake to one drink or less per day. Never drink and drive.  Depression- Your emotional health is as important as your physical health.  If you're feeling down or losing interest in things you normally enjoy please talk to your healthcare provider about being screened for depression.  Dental visit- Brush and floss your teeth twice daily; visit your dentist twice a year.  Eye doctor- Get an eye exam at least every 2 years.  Helmet use- Always wear a helmet when riding a bicycle, motorcycle, rollerblading or skateboarding.  Safe sex- If you may be exposed to sexually transmitted infections, use a condom.  Seat belts- Seat belts can save your live; always wear one.  Smoke/Carbon Monoxide detectors- These detectors need to be installed on the appropriate level of your home. Replace batteries at least once a year.  Skin cancer- When out in the sun please cover up and use sunscreen 15 SPF or higher.  Violence- If anyone is threatening or hurting you, please tell your healthcare provider.    Stress and Stress Management Stress is a normal reaction to life events. It is what you feel when life demands more than you are used to or more than you can handle. Some stress can be  useful. For example, the stress reaction can help you catch the last bus of the day, study for a test, or meet a deadline at work. But stress that occurs too often or for too long can cause problems. It can affect your emotional health and interfere with relationships and normal daily activities. Too much stress can weaken your immune system and increase your risk for physical  illness. If you already have a medical problem, stress can make it worse. CAUSES  All sorts of life events may cause stress. An event that causes stress for one person may not be stressful for another person. Major life events commonly cause stress. These may be positive or negative. Examples include losing your job, moving into a new home, getting married, having a baby, or losing a loved one. Less obvious life events may also cause stress, especially if they occur day after day or in combination. Examples include working long hours, driving in traffic, caring for children, being in debt, or being in a difficult relationship. SIGNS AND SYMPTOMS Stress may cause emotional symptoms including, the following:  Anxiety. This is feeling worried, afraid, on edge, overwhelmed, or out of control.  Anger. This is feeling irritated or impatient.  Depression. This is feeling sad, down, helpless, or guilty.  Difficulty focusing, remembering, or making decisions. Stress may cause physical symptoms, including the following:   Aches and pains. These may affect your head, neck, back, stomach, or other areas of your body.  Tight muscles or clenched jaw.  Low energy or trouble sleeping. Stress may cause unhealthy behaviors, including the following:   Eating to feel better (overeating) or skipping meals.  Sleeping too little, too much, or both.  Working too much or putting off tasks (procrastination).  Smoking, drinking alcohol, or using drugs to feel better. DIAGNOSIS  Stress is diagnosed through an assessment by your health care provider. Your health care provider will ask questions about your symptoms and any stressful life events.Your health care provider will also ask about your medical history and may order blood tests or other tests. Certain medical conditions and medicine can cause physical symptoms similar to stress. Mental illness can cause emotional symptoms and unhealthy behaviors similar  to stress. Your health care provider may refer you to a mental health professional for further evaluation.  TREATMENT  Stress management is the recommended treatment for stress.The goals of stress management are reducing stressful life events and coping with stress in healthy ways.  Techniques for reducing stressful life events include the following:  Stress identification. Self-monitor for stress and identify what causes stress for you. These skills may help you to avoid some stressful events.  Time management. Set your priorities, keep a calendar of events, and learn to say "no." These tools can help you avoid making too many commitments. Techniques for coping with stress include the following:  Rethinking the problem. Try to think realistically about stressful events rather than ignoring them or overreacting. Try to find the positives in a stressful situation rather than focusing on the negatives.  Exercise. Physical exercise can release both physical and emotional tension. The key is to find a form of exercise you enjoy and do it regularly.  Relaxation techniques. These relax the body and mind. Examples include yoga, meditation, tai chi, biofeedback, deep breathing, progressive muscle relaxation, listening to music, being out in nature, journaling, and other hobbies. Again, the key is to find one or more that you enjoy and can do regularly.  Healthy lifestyle. Eat  a balanced diet, get plenty of sleep, and do not smoke. Avoid using alcohol or drugs to relax.  Strong support network. Spend time with family, friends, or other people you enjoy being around.Express your feelings and talk things over with someone you trust. Counseling or talktherapy with a mental health professional may be helpful if you are having difficulty managing stress on your own. Medicine is typically not recommended for the treatment of stress.Talk to your health care provider if you think you need medicine for symptoms  of stress. HOME CARE INSTRUCTIONS  Keep all follow-up visits as directed by your health care provider.  Take all medicines as directed by your health care provider. SEEK MEDICAL CARE IF:  Your symptoms get worse or you start having new symptoms.  You feel overwhelmed by your problems and can no longer manage them on your own. SEEK IMMEDIATE MEDICAL CARE IF:  You feel like hurting yourself or someone else. Document Released: 05/15/2001 Document Revised: 04/05/2014 Document Reviewed: 07/14/2013 [facility] Patient Information 2015 [place], [place]. This information is not intended to replace advice given to you by your health care provider. Make sure you discuss any questions you have with your health care provider.

## 2015-03-31 NOTE — Progress Notes (Signed)
Subjective:    Patient ID: Nancy [name], female    DOB: 30-Dec-1965, 49 y.o.   MRN: [id number]  HPI  This 49 y.o female is here for CPE and fasting labs. PAP/pelvic performed at GYN practice; pt discussed menopausal symptoms and there are future labs ordered (FSH/LH) but pt not sure when she was supposed to have them drawn). Pt works in high-stress occupation as a Education officer, museum in foster care system. She has anxiety symptoms when she feels that she is overwhelmed w/ work; currently on medications.  Pt has hx of elevated BP but taking no medication for HTN.  HCM: PAP- As above; S/P Vag Hsyt.           MMG- To be performed at Dr. Sherran Needs practice.           IMM- Current.             Patient Active Problem List   Diagnosis Date Noted  . Status post laparoscopic assisted vaginal hysterectomy (LAVH) 07/07/2013    Class: Status post  . HTN (hypertension) 11/03/2012  . Dyspnea 07/31/2012  . Hyperlipidemia 07/30/2012  . Depression 07/30/2012    Prior to Admission medications   Medication Sig Start Date End Date Taking? Authorizing Provider  clonazePAM (KLONOPIN) 0.5 MG tablet Take 0.5 tablets (0.25 mg total) by mouth 2 (two) times daily.   Yes Barton Fanny, MD  methocarbamol (ROBAXIN) 500 MG tablet Take 1 tablet (500 mg total) by mouth 4 (four) times daily. 03/10/15  Yes Posey Boyer, MD  naproxen (NAPROSYN) 500 MG tablet Take 1 tablet (500 mg total) by mouth 2 (two) times daily with a meal. 03/10/15  Yes Posey Boyer, MD  traZODone (DESYREL) 100 MG tablet TAKE 1 TABLET (100 MG TOTAL) BY MOUTH AT BEDTIME.   Yes Barton Fanny, MD    Past Surgical History  Procedure Laterality Date  . Tubal ligation      bilateral  . Tonsillectomy    . Diagnostic laparoscopy    . Endometrial ablation w/ novasure    . Laparoscopic assisted vaginal hysterectomy N/A 07/07/2013    Procedure: LAPAROSCOPIC ASSISTED VAGINAL HYSTERECTOMY;  Surgeon: Darlyn Chamber, MD;  Location: [facility];  Service:  Gynecology;  Laterality: N/A;  . Abdominal hysterectomy      History   Social History  . Marital Status: Married    Spouse Name: N/A  . Number of Children: N/A  . Years of Education: N/A   Occupational History  . Education officer, museum.   Social History Main Topics  . Smoking status: Never Smoker   . Smokeless tobacco: Not on file  . Alcohol Use: 0.6 oz/week    1 Glasses of wine per week  . Drug Use: No  . Sexual Activity: Yes    Birth Control/ Protection: Other-see comments     Comment: pill   Other Topics Concern  . Not on file   Social History Narrative    Family History  Problem Relation Age of Onset  . Coronary artery disease Father   . Hypertension Father   . Heart disease Paternal Uncle   . Heart attack Paternal Uncle   . Stroke Father   . Heart attack Father     Review of Systems  Constitutional: Positive for diaphoresis.  Musculoskeletal: Positive for myalgias, back pain, arthralgias and neck stiffness.  Neurological: Positive for headaches.  Psychiatric/Behavioral: Positive for decreased concentration. The patient is nervous/anxious.   All other systems reviewed and  are negative.     Objective:   Physical Exam  Constitutional: She is oriented to person, place, and time. Vital signs are normal. She appears well-developed and well-nourished. No distress.  Blood pressure 130/81, pulse 78, temperature 98 F (36.7 C), resp. rate 16, height 5\' 3"  (1.6 m), weight 128 lb (58.06 kg), last menstrual period 04/17/2013, SpO2 100 %.   HENT:  Head: Normocephalic and atraumatic.  Right Ear: Hearing, tympanic membrane, external ear and ear canal normal.  Left Ear: Hearing, tympanic membrane, external ear and ear canal normal.  Nose: Nose normal. No nasal deformity or septal deviation.  Mouth/Throat: Uvula is midline, oropharynx is clear and moist and mucous membranes are normal. No oral lesions. Normal dentition.  Eyes: Conjunctivae, EOM and lids are normal. Pupils are  equal, round, and reactive to light. No scleral icterus.  Neck: Trachea normal, normal range of motion, full passive range of motion without pain and phonation normal. Neck supple. No spinous process tenderness and no muscular tenderness present. No thyroid mass and no thyromegaly present.  Cardiovascular: Normal rate, regular rhythm, S1 normal, S2 normal, normal heart sounds, intact distal pulses and normal pulses.   No extrasystoles are present. PMI is not displaced.  Exam reveals no gallop and no friction rub.   No murmur heard. Pulmonary/Chest: Effort normal and breath sounds normal. No respiratory distress. She has no decreased breath sounds. She has no wheezes. Right breast exhibits no inverted nipple, no mass, no nipple discharge, no skin change and no tenderness. Left breast exhibits no inverted nipple, no mass, no nipple discharge, no skin change and no tenderness. Breasts are symmetrical.  Abdominal: Soft. Normal appearance, normal aorta and bowel sounds are normal. She exhibits no distension and no mass. There is no hepatosplenomegaly. There is no tenderness. There is no guarding and no CVA tenderness.  Genitourinary:  Deferred.  Musculoskeletal:       Cervical back: She exhibits spasm. She exhibits normal range of motion, no tenderness and no deformity.       Thoracic back: Normal. She exhibits normal range of motion, no tenderness, no deformity, no pain and no spasm.       Lumbar back: Normal.  Remainder of exam unremarkable.  Lymphadenopathy:       Head (right side): No submental, no submandibular, no tonsillar, no preauricular, no posterior auricular and no occipital adenopathy present.       Head (left side): No submental, no submandibular, no tonsillar, no preauricular, no posterior auricular and no occipital adenopathy present.    She has no cervical adenopathy.    She has no axillary adenopathy.       Right: No inguinal and no supraclavicular adenopathy present.       Left: No  inguinal and no supraclavicular adenopathy present.  Neurological: She is alert and oriented to person, place, and time. She has normal strength and normal reflexes. She displays no atrophy. No cranial nerve deficit or sensory deficit. She exhibits normal muscle tone. Coordination and gait normal.  Skin: Skin is warm, dry and intact. No ecchymosis, no lesion and no rash noted. She is not diaphoretic. No cyanosis or erythema. Nails show no clubbing.  Psychiatric: Her speech is normal and behavior is normal. Judgment and thought content normal. Her mood appears anxious. Her affect is not labile and not inappropriate. Cognition and memory are normal. She does not exhibit a depressed mood.  Nursing note and vitals reviewed.      Assessment & Plan:  Annual physical exam - Plan: POCT urinalysis dipstick  Depression with anxiety- Refill clonazepam for prn use; continue Trazodone hs for sleep.   Perimenopause - Trial Effexor XR. Pt having difficulty w/ concentration, moodiness and night sweats.. Plan: FSH/LH  Vitamin D deficiency - Plan: Vitamin D, 25-hydroxy  Hyperlipidemia - Plan: Lipid panel  Essential hypertension - Plan: COMPLETE METABOLIC PANEL WITH GFR  Meds ordered this encounter  Medications  . traZODone (DESYREL) 100 MG tablet    Sig: TAKE 1 TABLET (100 MG TOTAL) BY MOUTH AT BEDTIME.    Dispense:  30 tablet    Refill:  5  . venlafaxine XR (EFFEXOR XR) 37.5 MG 24 hr capsule    Sig: Take 1 capsule for 7 days then take 2 capsules daily.    Dispense:  60 capsule    Refill:  5  . clonazePAM (KLONOPIN) 0.5 MG tablet    Sig: Take 0.5 tablets (0.25 mg total) by mouth 2 (two) times daily.    Dispense:  30 tablet    Refill:  1    May fill on or after 04/12/2015.

## 2015-04-01 LAB — VITAMIN D 25 HYDROXY (VIT D DEFICIENCY, FRACTURES): VIT D 25 HYDROXY: 26 ng/mL — AB (ref 30–100)

## 2015-04-01 LAB — FSH/LH
FSH: 39.3 m[IU]/mL
LH: 72.6 m[IU]/mL

## 2015-04-03 NOTE — Progress Notes (Signed)
Quick Note:  Please advise pt regarding following labs... Metabolic panel (salts, blood sugar, kidney and liver functions)- normal. Lipid panel shows total and LDL ("bad") cholesterol slightly above normal; HDL ("good") cholesterol is very high. Overall, lipid panel looks very good. Focus on healthy nutrition and regular exercise (20-30 minutes most days of the week- walking is a good start).  Fisher and LH hormones suggest you are in early menopause.  Vitamin D is below normal; get an over-the-counter supplement- Vitamin D3 2000 units 1 capsule daily and try to get 10-15 minutes of sun exposure most days of the week.  Contact the clinic if you have questions or concerns.  Copy to pt. ______

## 2015-04-08 ENCOUNTER — Telehealth: Payer: Self-pay | Admitting: *Deleted

## 2015-04-08 NOTE — Telephone Encounter (Signed)
Pt called in regards to lab results. I informed her of results. She expressed understanding ,

## 2015-05-18 ENCOUNTER — Other Ambulatory Visit: Payer: Self-pay | Admitting: Obstetrics and Gynecology

## 2015-05-19 LAB — CYTOLOGY - PAP

## 2015-07-29 ENCOUNTER — Encounter: Payer: Self-pay | Admitting: Emergency Medicine

## 2015-07-29 ENCOUNTER — Ambulatory Visit (INDEPENDENT_AMBULATORY_CARE_PROVIDER_SITE_OTHER): Payer: 59

## 2015-07-29 ENCOUNTER — Ambulatory Visit (INDEPENDENT_AMBULATORY_CARE_PROVIDER_SITE_OTHER): Payer: 59 | Admitting: Family Medicine

## 2015-07-29 VITALS — BP 118/80 | HR 72 | Temp 98.1°F | Resp 16 | Ht 62.5 in | Wt 132.0 lb

## 2015-07-29 DIAGNOSIS — M545 Low back pain, unspecified: Secondary | ICD-10-CM

## 2015-07-29 MED ORDER — MELOXICAM 15 MG PO TABS: 15.0000 mg | ORAL_TABLET | Freq: Every day | ORAL | Status: DC

## 2015-07-29 MED ORDER — TRAMADOL HCL 50 MG PO TABS
50.0000 mg | ORAL_TABLET | Freq: Three times a day (TID) | ORAL | Status: DC | PRN
Start: 2015-07-29 — End: 2016-08-17

## 2015-07-29 NOTE — Patient Instructions (Signed)
I will refer you to orthopedics to take a look at your back For the time being, try taking mobic once a day as needed (take this OR advil, not both,  They are both NSAID medication) You can also use the tramadol as needed for more severe pain- however this can make you sleepy,  Do not mix it with klonopin Let me know if you are getting worse or if you have any other concerns

## 2015-07-29 NOTE — Progress Notes (Signed)
[facility] [address] [place] [address] [phone]  Date:  07/29/2015   Name:  Nancy [name]   DOB:  09/13/1966   MRN:  [id number]  PCP:  No primary care provider on file.    Chief Complaint: Back Pain and Tailbone Pain   History of Present Illness:  Nancy [name] is a 49 y.o. very pleasant female patient who presents with the following:  Here today with concern of lower back and tailbone pain. She notes pressure if she sits, stands too long, or makes any sudden movements while standing.  She is not aware of any acute recent injury- however 6 months ago she did trip and fall onto her butt, and she did slide down some steps on her behind 8 months ago She has had some pain in her tailbone off an on since these injuries. She has not had any x-rays recently  The pain does not go down her legs.  No weakness, numbness, no bowel or bladder control concerns No pain with  BM She is generally in good health    She is no longer on BP medication  She is s/p hysterectomy She does take advil as needed, but it does not help too much   Patient Active Problem List   Diagnosis Date Noted  . Status post laparoscopic assisted vaginal hysterectomy (LAVH) 07/07/2013    Class: Status post  . HTN (hypertension) 11/03/2012  . Dyspnea 07/31/2012  . Hyperlipidemia 07/30/2012  . Depression 07/30/2012    Past Medical History  Diagnosis Date  . Anxiety   . Hyperlipidemia   . Hypertension     Past Surgical History  Procedure Laterality Date  . Tubal ligation      bilateral  . Tonsillectomy    . Diagnostic laparoscopy    . Endometrial ablation w/ novasure    . Laparoscopic assisted vaginal hysterectomy N/A 07/07/2013    Procedure: LAPAROSCOPIC ASSISTED VAGINAL HYSTERECTOMY;  Surgeon: Darlyn Chamber, MD;  Location: [facility];  Service: Gynecology;  Laterality: N/A;  . Abdominal hysterectomy      Social History  Substance Use Topics  . Smoking status: Never Smoker    . Smokeless tobacco: None  . Alcohol Use: 0.6 oz/week    1 Glasses of wine per week    Family History  Problem Relation Age of Onset  . Coronary artery disease Father   . Hypertension Father   . Heart disease Paternal Uncle   . Heart attack Paternal Uncle   . Stroke Father   . Heart attack Father     No Known Allergies  Medication list has been reviewed and updated.  Current Outpatient Prescriptions on File Prior to Visit  Medication Sig Dispense Refill  . clonazePAM (KLONOPIN) 0.5 MG tablet Take 0.5 tablets (0.25 mg total) by mouth 2 (two) times daily. 30 tablet 1  . methocarbamol (ROBAXIN) 500 MG tablet Take 1 tablet (500 mg total) by mouth 4 (four) times daily. (Patient not taking: Reported on 07/29/2015) 40 tablet 0  . naproxen (NAPROSYN) 500 MG tablet Take 1 tablet (500 mg total) by mouth 2 (two) times daily with a meal. (Patient not taking: Reported on 07/29/2015) 20 tablet 0  . traZODone (DESYREL) 100 MG tablet TAKE 1 TABLET (100 MG TOTAL) BY MOUTH AT BEDTIME. (Patient not taking: Reported on 07/29/2015) 30 tablet 5  . venlafaxine XR (EFFEXOR XR) 37.5 MG 24 hr capsule Take 1 capsule for 7 days then take 2  capsules daily. (Patient not taking: Reported on 07/29/2015) 60 capsule 5   No current facility-administered medications on file prior to visit.    Review of Systems:  As per HPI- otherwise negative.   Physical Examination: Filed Vitals:   07/29/15 1441  BP: 118/80  Pulse: 72  Temp: 98.1 F (36.7 C)  Resp: 16   Filed Vitals:   07/29/15 1441  Height: 5' 2.5" (1.588 m)  Weight: 132 lb (59.875 kg)   Body mass index is 23.74 kg/(m^2). Ideal Body Weight: Weight in (lb) to have BMI = 25: 138.6  GEN: WDWN, NAD, Non-toxic, A & O x 3 HEENT: Atraumatic, Normocephalic. Neck supple. No masses, No LAD. Ears and Nose: No external deformity. CV: RRR, No M/G/R. No JVD. No thrill. No extra heart sounds. PULM: CTA B, no wheezes, crackles, rhonchi. No retractions. No resp.  distress. No accessory muscle use. ABD: S, NT, ND, +BS. No rebound. No HSM. EXTR: No c/c/e NEURO Normal gait.  PSYCH: Normally interactive. Conversant. Not depressed or anxious appearing.  Calm demeanor.  Normal strength, sensation, DTR, and SLR in both LE She is tender over the lower lumbar vertebrea, but does not seem tender over the coccyx  [facility] reading (PRIMARY) by  Dr. Lorelei Pont. L spine: negative Sacrum/coccyx: negative  SACRUM AND COCCYX - 2+ VIEW  COMPARISON: None.  FINDINGS: Sacroiliac joints are symmetric. Femoral heads are located. Mild osseous irregularity about the distal coccyx is favored to be within normal variation. Remainder of the sacrum and coccyx are normal on the lateral view.  IMPRESSION: No acute osseous abnormality.  EXAM: LUMBAR SPINE - COMPLETE 4+ VIEW  COMPARISON: None.  FINDINGS: There is no evidence of lumbar spine fracture. Alignment is normal. Intervertebral disc spaces are maintained.  IMPRESSION: AA pelvic abnormality.  ADDENDUM REPORT: 07/29/2015 17:59  ADDENDUM: Error within the INITIAL impression of report.  IMPRESSION should be corrected to state:  No acute lumbar spine abnormalities.  Discussed with Dr. Lorelei Pont on 07/29/2015 at 1750 hr.   Assessment and Plan: Midline low back pain without sciatica - Plan: DG Lumbar Spine Complete, DG Sacrum/Coccyx, Ambulatory referral to Orthopedic Surgery, meloxicam (MOBIC) 15 MG tablet, traMADol (ULTRAM) 50 MG tablet  Here today with lower back pain- possible coccydynia Referral to ortho as it has bothered her for some time mobic and tramadol to use as needed for pain   She uses the klonpin rarely- do not combine with tramadol Signed Lamar Blinks, MD

## 2015-12-26 ENCOUNTER — Encounter: Payer: Self-pay | Admitting: Gastroenterology

## 2016-01-24 ENCOUNTER — Encounter: Payer: Self-pay | Admitting: Family Medicine

## 2016-01-24 ENCOUNTER — Ambulatory Visit (INDEPENDENT_AMBULATORY_CARE_PROVIDER_SITE_OTHER): Payer: 59 | Admitting: Family Medicine

## 2016-01-24 VITALS — BP 128/80 | HR 68 | Temp 98.3°F | Resp 16 | Ht 63.0 in | Wt 130.6 lb

## 2016-01-24 DIAGNOSIS — F411 Generalized anxiety disorder: Secondary | ICD-10-CM

## 2016-01-24 DIAGNOSIS — M25522 Pain in left elbow: Secondary | ICD-10-CM | POA: Diagnosis not present

## 2016-01-24 DIAGNOSIS — M25521 Pain in right elbow: Secondary | ICD-10-CM

## 2016-01-24 MED ORDER — ALPRAZOLAM 0.25 MG PO TABS: 0.2500 mg | ORAL_TABLET | Freq: Two times a day (BID) | ORAL | Status: DC | PRN

## 2016-01-24 NOTE — Progress Notes (Signed)
Subjective:    Patient ID: Nancy [name], female    DOB: October 16, 1966, 50 y.o.   MRN: [id number]  HPI This is a pleasant 50 yo female who presents today with about 5 months of bilateral elbow pain. Has tried Bengay, advil (400 mg occasionally) and alleve (occasionally) without relief. Some temporary relief with heating pad/hot bath. Pain various times of day, every day. No numbness or tingling. Feels weak- has difficulty combing hair and lifting dishes. No pain with keyboarding. Has stiffness behind knees (R>L), worse after sitting for long periods of time. Occasional low back pain for long time. Was on Meloxicam in the past but did not feel it was effective. No family history of arthritis. Has a bump on right forearm x approximately 2 years. No pain, redness or increase in size.   She has been on clonazepam 0.5 mg for about a year. Uses when she has anxiety related to her job, usually 2-3x/week. 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 Attending court is sometimes very stressful. Home life good. She has never felt like the clonazepam worked well.  Has increased dose without improved relief. She also exercises regularly which helps. She does not feel like her anxiety is getting any worse. Tries to manage it herself before taking medication.   She has received information regarding screening colonoscopy from her insurance company and plans to schedule an appointment.   Past Medical History  Diagnosis Date  . Anxiety   . Hyperlipidemia   . Hypertension    Past Surgical History  Procedure Laterality Date  . Tubal ligation      bilateral  . Tonsillectomy    . Diagnostic laparoscopy    . Endometrial ablation w/ novasure    . Laparoscopic assisted vaginal hysterectomy N/A 07/07/2013    Procedure: LAPAROSCOPIC ASSISTED VAGINAL HYSTERECTOMY;  Surgeon: Darlyn Chamber, MD;  Location: [facility];  Service: Gynecology;  Laterality: N/A;  . Abdominal hysterectomy     Family History    Problem Relation Age of Onset  . Coronary artery disease Father   . Hypertension Father   . Heart disease Paternal Uncle   . Heart attack Paternal Uncle   . Stroke Father   . Heart attack Father    Social History  Substance Use Topics  . Smoking status: Never Smoker   . Smokeless tobacco: None  . Alcohol Use: 0.6 oz/week    1 Glasses of wine per week      Review of Systems Per HPI    Objective:   Physical Exam  Constitutional: She is oriented to person, place, and time. She appears well-developed and well-nourished. No distress.  HENT:  Head: Normocephalic and atraumatic.  Eyes: Conjunctivae are normal.  Neck: Normal range of motion. Neck supple.  Cardiovascular: Normal rate, regular rhythm and normal heart sounds.   Pulmonary/Chest: Effort normal and breath sounds normal.  Musculoskeletal: Normal range of motion.  Bilateral elbows with good ROM, no edema, no erythema, no crepitus.  Bilateral knees normal to exam.  Right forearm with 1.5 cm lipoma.   Neurological: She is alert and oriented to person, place, and time.  Skin: Skin is warm and dry. She is not diaphoretic.  Psychiatric: She has a normal mood and affect. Her behavior is normal. Judgment and thought content normal.  Vitals reviewed.   BP 128/80 mmHg  Pulse 68  Temp(Src) 98.3 F (36.8 C) (Oral)  Resp 16  Ht 5\' 3"  (1.6 m)  Wt  130 lb 9.6 oz (59.24 kg)  BMI 23.14 kg/m2  SpO2 99%  LMP 04/17/2013 Wt Readings from Last 3 Encounters:  01/24/16 130 lb 9.6 oz (59.24 kg)  07/29/15 132 lb (59.875 kg)  03/31/15 128 lb (58.06 kg)       Assessment & Plan:  1. Arthralgia of both elbows - Sedimentation Rate - Rheumatoid factor - ANA - trial of ibuprofen 600 mg BID for 5-7 days  2. Generalized anxiety disorder - Discussed stress management and potential for dependence and tolerance with benzodiazepines.  - ALPRAZolam (XANAX) 0.25 MG tablet; Take 1 tablet (0.25 mg total) by mouth 2 (two) times daily as  needed for anxiety.  Dispense: 20 tablet; Refill: 2 - encouraged continued walking and suggested she add yoga/meditation for stress relief - if no improvement with xanax, consider beta-blocker - f/u for CPE  Clarene Reamer, FNP-BC  [facility]  01/24/2016 1:52 PM

## 2016-01-24 NOTE — Patient Instructions (Addendum)
[email or web address] for yoga, try to stretch after walking  For inflammation, please take ibuprofen 600 mg twice a day for 5-7 days to see if you have improvement of your symptoms.

## 2016-01-25 LAB — ANTI-NUCLEAR AB-TITER (ANA TITER)

## 2016-01-25 LAB — ANA: Anti Nuclear Antibody(ANA): POSITIVE — AB

## 2016-01-25 LAB — RHEUMATOID FACTOR: Rhuematoid fact SerPl-aCnc: <10 IU/mL (ref <=14)

## 2016-01-25 LAB — SEDIMENTATION RATE: Sed Rate: 1 mm/hr (ref 0–20)

## 2016-01-26 ENCOUNTER — Other Ambulatory Visit: Payer: Self-pay | Admitting: Family Medicine

## 2016-01-26 DIAGNOSIS — R768 Other specified abnormal immunological findings in serum: Secondary | ICD-10-CM

## 2016-01-27 NOTE — Progress Notes (Signed)
Called Pt and left VM

## 2016-05-17 ENCOUNTER — Other Ambulatory Visit: Payer: Self-pay | Admitting: Family Medicine

## 2016-05-19 NOTE — Telephone Encounter (Signed)
Phoned into patient's pharmacy

## 2016-07-24 ENCOUNTER — Encounter: Payer: Self-pay | Admitting: Obstetrics and Gynecology

## 2016-08-17 ENCOUNTER — Ambulatory Visit (INDEPENDENT_AMBULATORY_CARE_PROVIDER_SITE_OTHER): Payer: 59 | Admitting: Physician Assistant

## 2016-08-17 ENCOUNTER — Ambulatory Visit (INDEPENDENT_AMBULATORY_CARE_PROVIDER_SITE_OTHER): Payer: 59

## 2016-08-17 VITALS — BP 143/84 | HR 66 | Temp 98.1°F | Resp 16 | Ht 62.0 in | Wt 135.8 lb

## 2016-08-17 DIAGNOSIS — R0609 Other forms of dyspnea: Secondary | ICD-10-CM

## 2016-08-17 DIAGNOSIS — Z131 Encounter for screening for diabetes mellitus: Secondary | ICD-10-CM

## 2016-08-17 DIAGNOSIS — F411 Generalized anxiety disorder: Secondary | ICD-10-CM | POA: Diagnosis not present

## 2016-08-17 DIAGNOSIS — Z1329 Encounter for screening for other suspected endocrine disorder: Secondary | ICD-10-CM

## 2016-08-17 DIAGNOSIS — Z13220 Encounter for screening for lipoid disorders: Secondary | ICD-10-CM

## 2016-08-17 DIAGNOSIS — R030 Elevated blood-pressure reading, without diagnosis of hypertension: Secondary | ICD-10-CM

## 2016-08-17 DIAGNOSIS — IMO0001 Reserved for inherently not codable concepts without codable children: Secondary | ICD-10-CM

## 2016-08-17 LAB — CBC
HCT: 34.6 % — ABNORMAL LOW (ref 35.0–45.0)
HEMOGLOBIN: 11.9 g/dL (ref 11.7–15.5)
MCH: 28.3 pg (ref 27.0–33.0)
MCHC: 34.4 g/dL (ref 32.0–36.0)
MCV: 82.2 fL (ref 80.0–100.0)
MPV: 11.1 fL (ref 7.5–12.5)
Platelets: 278 10*3/uL (ref 140–400)
RBC: 4.21 MIL/uL (ref 3.80–5.10)
RDW: 12.7 % (ref 11.0–15.0)
WBC: 6.3 10*3/uL (ref 3.8–10.8)

## 2016-08-17 LAB — COMPLETE METABOLIC PANEL WITH GFR
ALT: 8 U/L (ref 6–29)
AST: 13 U/L (ref 10–35)
Albumin: 4.3 g/dL (ref 3.6–5.1)
Alkaline Phosphatase: 41 U/L (ref 33–130)
BUN: 11 mg/dL (ref 7–25)
CALCIUM: 9.7 mg/dL (ref 8.6–10.4)
CHLORIDE: 104 mmol/L (ref 98–110)
CO2: 23 mmol/L (ref 20–31)
CREATININE: 0.76 mg/dL (ref 0.50–1.05)
GFR, Est Non African American: >89 mL/min (ref >=60)
Glucose, Bld: 87 mg/dL (ref 65–99)
POTASSIUM: 4.5 mmol/L (ref 3.5–5.3)
Sodium: 138 mmol/L (ref 135–146)
Total Bilirubin: 1.1 mg/dL (ref 0.2–1.2)
Total Protein: 7.1 g/dL (ref 6.1–8.1)

## 2016-08-17 LAB — LIPID PANEL
Cholesterol: 198 mg/dL (ref 125–200)
HDL: 64 mg/dL (ref >=46)
LDL Cholesterol: 122 mg/dL (ref <130)
TRIGLYCERIDES: 59 mg/dL (ref <150)
Total CHOL/HDL Ratio: 3.1 Ratio (ref <=5.0)
VLDL: 12 mg/dL (ref <30)

## 2016-08-17 LAB — TSH: TSH: 1.43 m[IU]/L

## 2016-08-17 MED ORDER — CITALOPRAM HYDROBROMIDE 20 MG PO TABS: 10.0000 mg | ORAL_TABLET | Freq: Every day | ORAL | 1 refills | Status: DC

## 2016-08-17 MED ORDER — TRAZODONE HCL 100 MG PO TABS: 100.0000 mg | ORAL_TABLET | Freq: Every evening | ORAL | 1 refills | Status: DC | PRN

## 2016-08-17 NOTE — Progress Notes (Signed)
08/17/2016 10:33 AM   DOB: 1965/12/10 / MRN: [id number]  SUBJECTIVE:  Nancy [name] is a 50 y.o. female presenting for new DOE that started about two weeks ago.  She associates "crampy" chest pain with the DOE.  Both are relieved with rest.  Walking up stairs or inclines makes the pain worse.  She has not tried any medication as of yet.  Mother and father have a history of CAD.  She has a history of HTN and dyslipidemia, both of which are controlled with diet.    She denies any leg swelling and posterior calf pain.  She denies any belly pain, nausea, and GERD like symptoms.    She would like a refill of her Xanax today.  States she has a very stressful job and take 1 tab about every other day.  She knows this is an addictive medication and is amenable to trying something else.  She screened negative for depression today.      She has No Known Allergies.   She  has a past medical history of Anxiety; Hyperlipidemia; and Hypertension.    She  reports that she has never smoked. She does not have any smokeless tobacco history on file. She reports that she drinks about 0.6 oz of alcohol per week . She reports that she does not use drugs. She  reports that she currently engages in sexual activity. She reports using the following method of birth control/protection: Other-see comments. The patient  has a past surgical history that includes Tubal ligation; Tonsillectomy; Diagnostic laparoscopy; Endometrial ablation w/ novasure; Laparoscopic assisted vaginal hysterectomy (N/A, 07/07/2013); and Abdominal hysterectomy.  Her family history includes Coronary artery disease in her father; Heart attack in her father and paternal uncle; Heart disease in her paternal uncle; Hypertension in her father; Stroke in her father.  Review of Systems  Constitutional: Negative for chills and fever.  Respiratory: Positive for shortness of breath. Negative for cough, hemoptysis, sputum production and wheezing.     Cardiovascular: Positive for chest pain. Negative for palpitations, orthopnea, claudication, leg swelling and PND.  Gastrointestinal: Negative for nausea.  Skin: Negative for itching and rash.  Neurological: Negative for dizziness and headaches.    The problem list and medications were reviewed and updated by myself where necessary and exist elsewhere in the encounter.   OBJECTIVE:  BP (!) 143/84   Pulse 66   Temp 98.1 F (36.7 C) (Oral)   Resp 16   Ht 5\' 2"  (1.575 m)   Wt 135 lb 12.8 oz (61.6 kg)   LMP 04/17/2013   SpO2 100%   BMI 24.84 kg/m   Physical Exam  Constitutional: She is oriented to person, place, and time. She appears well-developed and well-nourished. No distress.  Cardiovascular: Normal rate, regular rhythm and normal heart sounds.   Pulmonary/Chest: Effort normal and breath sounds normal.  Musculoskeletal: She exhibits no edema.  Neurological: She is alert and oriented to person, place, and time.  Skin: Skin is warm and dry. She is not diaphoretic.  Psychiatric: She has a normal mood and affect. Her behavior is normal. Judgment and thought content normal.    No results found for this or any previous visit (from the past 72 hour(s)).  Dg Chest 2 View  Result Date: 08/17/2016 CLINICAL DATA:  Dyspnea on exertion EXAM: CHEST  2 VIEW COMPARISON:  10/14/2011 chest radiograph. FINDINGS: Stable cardiomediastinal silhouette with normal heart size. No pneumothorax. No pleural effusion. Lungs appear clear, with no acute consolidative airspace  disease and no pulmonary edema. IMPRESSION: No active cardiopulmonary disease. Electronically Signed   By: Ilona Sorrel M.D.   On: 08/17/2016 09:57    EKG: Respiratory arrhythmia without evidence of hypertrophy, ischemia, and infarct.   ASSESSMENT AND PLAN  Greidy was seen today for shortness of breath, medication refill and need referral for colonoscopy.  Diagnoses and all orders for this visit:  DOE (dyspnea on exertion): Sx  obviously concerning for stable angina however I am optimistic given her exam and work up.  Will get her in with cardiology. Labs pending.  -     EKG 12-Lead -     DG Chest 2 View; Future -     Lipid panel -     CBC -     Brain natriuretic peptide -     Ambulatory referral to Cardiology  Elevated BP -     COMPLETE METABOLIC PANEL WITH GFR  Screening for diabetes mellitus -     Hemoglobin A1c  Screening, lipid -     Lipid panel  Screening for thyroid disorder -     TSH  Generalized anxiety disorder -     citalopram (CELEXA) 20 MG tablet; Take 0.5 tablets (10 mg total) by mouth daily. -     traZODone (DESYREL) 100 MG tablet; Take 1 tablet (100 mg total) by mouth at bedtime as needed for sleep.  Other orders -     Cancel: Flu Vaccine QUAD 36+ mos PF IM (Fluarix & Fluzone Quad PF)    The patient is advised to call or return to clinic if she does not see an improvement in symptoms, or to seek the care of the closest emergency department if she worsens with the above plan.   Philis Fendt, MHS, PA-C [facility] 08/17/2016 10:33 AM

## 2016-08-18 LAB — BRAIN NATRIURETIC PEPTIDE

## 2016-08-19 LAB — HEMOGLOBIN A1C
HEMOGLOBIN A1C: 4.4 % (ref <5.7)
MEAN PLASMA GLUCOSE: 80 mg/dL

## 2016-09-14 ENCOUNTER — Ambulatory Visit (INDEPENDENT_AMBULATORY_CARE_PROVIDER_SITE_OTHER): Payer: 59 | Admitting: Cardiovascular Disease

## 2016-09-14 ENCOUNTER — Encounter: Payer: Self-pay | Admitting: Cardiovascular Disease

## 2016-09-14 VITALS — BP 135/89 | HR 62 | Ht 62.0 in | Wt 135.2 lb

## 2016-09-14 DIAGNOSIS — R0602 Shortness of breath: Secondary | ICD-10-CM

## 2016-09-14 DIAGNOSIS — R0789 Other chest pain: Secondary | ICD-10-CM

## 2016-09-14 DIAGNOSIS — Z8249 Family history of ischemic heart disease and other diseases of the circulatory system: Secondary | ICD-10-CM

## 2016-09-14 NOTE — Progress Notes (Signed)
Cardiology Consultation Note    Date:  09/14/2016   ID:  Nancy [name], DOB [birth year], MRN [id number]  PCP:  Philis Fendt, [facility]  Cardiologist:   Sanda Klein, MD  Referred in consultation: By Philis Fendt, PA-C, for exertional chest pain and exertional dyspnea Chief Complaint  Patient presents with  . Chest Pain    pt states when walking and doing things in a hurry   . Shortness of Breath    when her chest hurts     History of Present Illness:  Nancy [name] is a 50 y.o. female with a strong family history of premature coronary artery disease but without any other coronary risk factors, presenting with complaints of exertional chest discomfort and exertional angina.  She has noticed chest discomfort only when climbing stairs or when walking at a brisker than usual pace. She tries to walk 3 days a week for 2-3 miles each time. She usually covers this distance in about an hour. If she develops chest discomfort (for example when climbing a flight of stairs) she stops and rests and the symptoms resolve within roughly 2 minutes. She has noticed exertional dyspnea and becomes more easily short of breath when walking compared with a year ago. Her symptoms began relatively recently, about 6 weeks ago.  She does not smoke have hypertension or diabetes mellitus and has a generally favorable lipid profile. She had a partial hysterectomy roughly 4 years ago and is currently having some perimenopausal hot flash symptoms. Her father had heart disease starting in his 79s and has undergone both stents and bypass surgery. Her mother started having coronary problems in her 68s and had an aborted infarction with percutaneous revascularization.  The patient specifically denies orthopnea, paroxysmal nocturnal dyspnea, syncope, palpitations, focal neurological deficits, intermittent claudication, lower extremity edema, unexplained weight gain, cough, hemoptysis or wheezing.  The patient also denies  abdominal pain, nausea, vomiting, dysphagia, diarrhea, constipation, polyuria, polydipsia, dysuria, hematuria, frequency, urgency, abnormal bleeding or bruising, fever, chills, unexpected weight changes, mood swings, change in skin or hair texture, change in voice quality, auditory or visual problems, allergic reactions or rashes, new musculoskeletal complaints other than usual "aches and pains".    Past Medical History:  Diagnosis Date  . Anxiety   . Hyperlipidemia   . Hypertension     Past Surgical History:  Procedure Laterality Date  . ABDOMINAL HYSTERECTOMY    . DIAGNOSTIC LAPAROSCOPY    . ENDOMETRIAL ABLATION W/ NOVASURE    . LAPAROSCOPIC ASSISTED VAGINAL HYSTERECTOMY N/A 07/07/2013   Procedure: LAPAROSCOPIC ASSISTED VAGINAL HYSTERECTOMY;  Surgeon: Darlyn Chamber, MD;  Location: [facility];  Service: Gynecology;  Laterality: N/A;  . TONSILLECTOMY    . TUBAL LIGATION     bilateral    Current Medications: Outpatient Medications Prior to Visit  Medication Sig Dispense Refill  . citalopram (CELEXA) 20 MG tablet Take 0.5 tablets (10 mg total) by mouth daily. 45 tablet 1  . traZODone (DESYREL) 100 MG tablet Take 1 tablet (100 mg total) by mouth at bedtime as needed for sleep. 90 tablet 1   No facility-administered medications prior to visit.      Allergies:   Review of patient's allergies indicates no known allergies.   Social History   Social History  . Marital status: Married    Spouse name: N/A  . Number of children: N/A  . Years of education: N/A   Social History Main Topics  . Smoking status: Never Smoker  . Smokeless tobacco:  None  . Alcohol use 0.6 oz/week    1 Glasses of wine per week  . Drug use: No  . Sexual activity: Yes    Birth control/ protection: Other-see comments     Comment: pill   Other Topics Concern  . None   Social History Narrative  . None     Family History:  The patient's family history includes Coronary artery disease in her father; Heart  attack in her father and paternal uncle; Heart disease in her paternal uncle; Hypertension in her father; Stroke in her father.   ROS:   Please see the history of present illness.    ROS All other systems reviewed and are negative.   PHYSICAL EXAM:   VS:  BP 135/89   Pulse 62   Ht 5\' 2"  (1.575 m)   Wt 135 lb 3.2 oz (61.3 kg)   LMP 04/17/2013   BMI 24.73 kg/m    GEN: Well nourished, well developed, in no acute distress , she appears healthy and fit HEENT: normal  Neck: no JVD, carotid bruits, or masses Cardiac: RRR; no murmurs, rubs, or gallops,no edema, 2+ radials, ulnar and pedal pulses bilaterally  Respiratory:  clear to auscultation bilaterally, normal work of breathing GI: soft, nontender, nondistended, + BS MS: no deformity or atrophy  Skin: warm and dry, no rash Neuro:  Alert and Oriented x 3, Strength and sensation are intact Psych: euthymic mood, full affect  Wt Readings from Last 3 Encounters:  09/14/16 135 lb 3.2 oz (61.3 kg)  08/17/16 135 lb 12.8 oz (61.6 kg)  01/24/16 130 lb 9.6 oz (59.2 kg)      Studies/Labs Reviewed:   EKG:  EKG is not ordered today.  The ekg ordered 08/17/16 demonstrates Sinus rhythm, normal tracing  Recent Labs: 08/17/2016: ALT 8; Brain Natriuretic Peptide <4.0; BUN 11; Creat 0.76; Hemoglobin 11.9; Platelets 278; Potassium 4.5; Sodium 138; TSH 1.43   Lipid Panel    Component Value Date/Time   CHOL 198 08/17/2016 0937   CHOL 147 02/13/2013 0902   TRIG 59 08/17/2016 0937   TRIG 72 02/13/2013 0902   HDL 64 08/17/2016 0937   HDL 43 02/13/2013 0902   CHOLHDL 3.1 08/17/2016 0937   VLDL 12 08/17/2016 0937   LDLCALC 122 08/17/2016 0937   LDLCALC 90 02/13/2013 0902    Additional studies/ records that were reviewed today include:  Notes from PCP, CXR    ASSESSMENT:    1. Shortness of breath   2. Other chest pain   3. Family history of coronary artery disease occurring prior to 50 years of age      PLAN:  In order of problems  listed above:  Nancy [name] describes fairly typical pattern of recent onset exertional angina pectoris, but does not have any symptoms that are concerning for an acute coronary syndrome. Her ECG is low risk. Her coronary risk profile is low to intermediate, primarily impacted by her father's early onset of disease. Although her LDL cholesterol is slightly higher than desirable, her HDL cholesterol is excellent. Exertional dyspnea is also prominent part of her complaints. Will schedule for a full echocardiogram followed by a stress echo looking for evidence of coronary artery disease or other structural abnormalities. Normal BNP suggests that she does not have systolic dysfunction, but her symptoms may be simply related to stable coronary disease. Until we clarify her diagnosis I recommend that she take aspirin 81 mg daily. If CAD's identified she should start treatment with a  statin. If we do not find CAD, recommend more attention to diet and exercise.    Medication Adjustments/Labs and Tests Ordered: Current medicines are reviewed at length with the patient today.  Concerns regarding medicines are outlined above.  Medication changes, Labs and Tests ordered today are listed in the Patient Instructions below. Patient Instructions  Medication Instructions: Dr Sallyanne Kuster has recommended making the following medication changes: 1. START Aspirin 81 mg - take 1 tablet by mouth daily  Labwork: NONE ORDERED  Testing/Procedures: 1. Echocardiogram - Your physician has requested that you have an echocardiogram. Echocardiography is a painless test that uses sound waves to create images of your heart. It provides your doctor with information about the size and shape of your heart and how well your heart's chambers and valves are working. This procedure takes approximately one hour. There are no restrictions for this procedure.  2. Stress Echocardiogram - Your physician has requested that you have a stress  echocardiogram. For further information please visit [email or web address]. Please follow instruction sheet as given.  **These tests will be performed at our [facility] location - [address] Please schedule tests on the same day with the plain echocardiogram completed first.  Follow-up: Dr Sallyanne Kuster recommends that you schedule a follow-up appointment after testing.  If you need a refill on your cardiac medications before your next appointment, please call your pharmacy.    Signed, Sanda Klein, MD  09/14/2016 8:39 AM    [facility] [place], [place], [place]  [address] Phone: [phone]; Fax: [phone]

## 2016-09-14 NOTE — Patient Instructions (Signed)
Medication Instructions: Dr Sallyanne Kuster has recommended making the following medication changes: 1. START Aspirin 81 mg - take 1 tablet by mouth daily  Labwork: NONE ORDERED  Testing/Procedures: 1. Echocardiogram - Your physician has requested that you have an echocardiogram. Echocardiography is a painless test that uses sound waves to create images of your heart. It provides your doctor with information about the size and shape of your heart and how well your heart's chambers and valves are working. This procedure takes approximately one hour. There are no restrictions for this procedure.  2. Stress Echocardiogram - Your physician has requested that you have a stress echocardiogram. For further information please visit [email or web address]. Please follow instruction sheet as given.  **These tests will be performed at our [facility] location - [address] Please schedule tests on the same day with the plain echocardiogram completed first.  Follow-up: Dr Sallyanne Kuster recommends that you schedule a follow-up appointment after testing.  If you need a refill on your cardiac medications before your next appointment, please call your pharmacy.

## 2016-10-11 ENCOUNTER — Telehealth (HOSPITAL_COMMUNITY): Payer: Self-pay | Admitting: *Deleted

## 2016-10-11 NOTE — Telephone Encounter (Signed)
Left message on voicemail per DPR in reference to upcoming appointment scheduled on 10/16/16 at 2:30 with detailed instructions given per Stress Test Requisition Sheet for the test. LM to arrive 30 minutes early, and that it is imperative to arrive on time for appointment to keep from having the test rescheduled. If you need to cancel or reschedule your appointment, please call the office within 24 hours of your appointment. Failure to do so may result in a cancellation of your appointment, and a $50 no show fee. Phone number given for call back for any questions. Nancy [name]

## 2016-10-16 ENCOUNTER — Ambulatory Visit (HOSPITAL_COMMUNITY): Payer: 59

## 2016-10-16 ENCOUNTER — Ambulatory Visit (HOSPITAL_BASED_OUTPATIENT_CLINIC_OR_DEPARTMENT_OTHER): Payer: 59

## 2016-10-16 ENCOUNTER — Ambulatory Visit (HOSPITAL_COMMUNITY): Payer: 59 | Attending: Cardiovascular Disease

## 2016-10-16 ENCOUNTER — Other Ambulatory Visit: Payer: Self-pay

## 2016-10-16 DIAGNOSIS — R0789 Other chest pain: Secondary | ICD-10-CM | POA: Diagnosis present

## 2016-10-16 DIAGNOSIS — R0602 Shortness of breath: Secondary | ICD-10-CM

## 2016-10-16 DIAGNOSIS — I501 Left ventricular failure: Secondary | ICD-10-CM | POA: Diagnosis not present

## 2016-10-24 ENCOUNTER — Telehealth: Payer: Self-pay | Admitting: Cardiovascular Disease

## 2016-10-24 MED ORDER — FUROSEMIDE 20 MG PO TABS: 20.0000 mg | ORAL_TABLET | Freq: Every day | ORAL | 3 refills | Status: DC

## 2016-10-24 NOTE — Telephone Encounter (Signed)
F/u Message ° °Pt returning RN call. Please call back to discuss  °

## 2016-10-24 NOTE — Telephone Encounter (Signed)
Results reviewed with patient. Prescription for Furosemide 20 mg QD sent to patient's preferred pharmacy. Per patient's request, information regarding low sodium diet and potassium content mailed to patient. Patient verbalized understanding and agreed with plan.

## 2016-10-24 NOTE — Telephone Encounter (Signed)
Sanda Klein, MD  Dionne Bucy Truitt, CMA        The baseline echo does suggest that her shortness of breath may be heart related due to a "stiff" heart with impaired relaxation filling. But the stress echo is normal, suggesting that this is not due to coronary disease.  Please start furosemide 20 mg daily and eat a low sodium, potassium rich diet and make sure she has a follow up appointment, please.

## 2016-12-10 ENCOUNTER — Encounter (INDEPENDENT_AMBULATORY_CARE_PROVIDER_SITE_OTHER): Payer: Self-pay

## 2016-12-10 ENCOUNTER — Ambulatory Visit (INDEPENDENT_AMBULATORY_CARE_PROVIDER_SITE_OTHER): Payer: 59 | Admitting: Cardiovascular Disease

## 2016-12-10 ENCOUNTER — Encounter: Payer: Self-pay | Admitting: Cardiovascular Disease

## 2016-12-10 VITALS — BP 110/74 | HR 72 | Ht 62.0 in | Wt 136.6 lb

## 2016-12-10 DIAGNOSIS — E7800 Pure hypercholesterolemia, unspecified: Secondary | ICD-10-CM

## 2016-12-10 DIAGNOSIS — R079 Chest pain, unspecified: Secondary | ICD-10-CM | POA: Diagnosis not present

## 2016-12-10 DIAGNOSIS — I5032 Chronic diastolic (congestive) heart failure: Secondary | ICD-10-CM | POA: Insufficient documentation

## 2016-12-10 DIAGNOSIS — I10 Essential (primary) hypertension: Secondary | ICD-10-CM

## 2016-12-10 DIAGNOSIS — Z79899 Other long term (current) drug therapy: Secondary | ICD-10-CM

## 2016-12-10 DIAGNOSIS — I209 Angina pectoris, unspecified: Secondary | ICD-10-CM | POA: Insufficient documentation

## 2016-12-10 DIAGNOSIS — E785 Hyperlipidemia, unspecified: Secondary | ICD-10-CM

## 2016-12-10 MED ORDER — ATORVASTATIN CALCIUM 20 MG PO TABS: 20.0000 mg | ORAL_TABLET | Freq: Every day | ORAL | 3 refills | Status: DC

## 2016-12-10 NOTE — Patient Instructions (Signed)
Dr Sallyanne Kuster has recommended making the following medication changes: 1. START Atorvastatin 20 mg - take 1 tablet by mouth once daily  Your physician recommends that you return for lab work in 3 months - FASTING.  Dr Sallyanne Kuster recommends that you schedule a follow-up appointment in 3 months.  If you need a refill on your cardiac medications before your next appointment, please call your pharmacy.

## 2016-12-10 NOTE — Progress Notes (Signed)
Cardiology Consultation Note    Date:  12/10/2016   ID:  Nancy [name], DOB 04-30-1966, MRN [id number]  PCP:  Philis Fendt, [facility]  Cardiologist:   Sanda Klein, MD  Referred in consultation: By Philis Fendt, PA-C, for exertional chest pain and exertional dyspnea Chief Complaint  Patient presents with  . Follow-up    test results     History of Present Illness:  Nancy [name] is a 51 y.o. female with a strong family history of premature coronary artery disease but without any other coronary risk factors, presenting with complaints of exertional chest discomfort and exertional dyspnea.She returns in follow-up after undergoing a stress echocardiogram. The study showed evidence of diastolic dysfunction with elevated filling pressures, systolic function was normal and the stress echo did not show exercise-induced wall motion abnormalities.  After starting a low-dose of diuretics, her symptoms have improved. She hasn't really pushed herself yet physically. She has not had any exertional chest discomfort since her last appointment.  Diastolic dysfunction was present despite the absence of left ventricular hypertrophy, so the possibility of coronary artery disease is still an issue.  She does not smoke have hypertension or diabetes mellitus. Her LDL cholesterol was elevated 122, down to 90 on therapy, but she stopped taking the medication (the prescription simply ran out, no side effects).   She had a partial hysterectomy roughly 4 years ago and is currently having some perimenopausal hot flash symptoms. Her father had heart disease starting in his 60s and has undergone both stents and bypass surgery. Her mother started having coronary problems in her 40s and had an aborted infarction with percutaneous revascularization.  The patient specifically denies orthopnea, paroxysmal nocturnal dyspnea, syncope, palpitations, focal neurological deficits, intermittent claudication, lower extremity edema,  unexplained weight gain, cough, hemoptysis or wheezing.  The patient also denies abdominal pain, nausea, vomiting, dysphagia, diarrhea, constipation, polyuria, polydipsia, dysuria, hematuria, frequency, urgency, abnormal bleeding or bruising, fever, chills, unexpected weight changes, mood swings, change in skin or hair texture, change in voice quality, auditory or visual problems, allergic reactions or rashes, new musculoskeletal complaints other than usual "aches and pains".    Past Medical History:  Diagnosis Date  . Anxiety   . Hyperlipidemia   . Hypertension     Past Surgical History:  Procedure Laterality Date  . ABDOMINAL HYSTERECTOMY    . DIAGNOSTIC LAPAROSCOPY    . ENDOMETRIAL ABLATION W/ NOVASURE    . LAPAROSCOPIC ASSISTED VAGINAL HYSTERECTOMY N/A 07/07/2013   Procedure: LAPAROSCOPIC ASSISTED VAGINAL HYSTERECTOMY;  Surgeon: Darlyn Chamber, MD;  Location: [facility];  Service: Gynecology;  Laterality: N/A;  . TONSILLECTOMY    . TUBAL LIGATION     bilateral    Current Medications: Outpatient Medications Prior to Visit  Medication Sig Dispense Refill  . citalopram (CELEXA) 20 MG tablet Take 0.5 tablets (10 mg total) by mouth daily. 45 tablet 1  . furosemide (LASIX) 20 MG tablet Take 1 tablet (20 mg total) by mouth daily. 90 tablet 3  . traZODone (DESYREL) 100 MG tablet Take 1 tablet (100 mg total) by mouth at bedtime as needed for sleep. 90 tablet 1   No facility-administered medications prior to visit.      Allergies:   Patient has no known allergies.   Social History   Social History  . Marital status: Married    Spouse name: N/A  . Number of children: N/A  . Years of education: N/A   Social History Main Topics  . Smoking  status: Never Smoker  . Smokeless tobacco: None  . Alcohol use 0.6 oz/week    1 Glasses of wine per week  . Drug use: No  . Sexual activity: Yes    Birth control/ protection: Other-see comments     Comment: pill   Other Topics Concern  . None    Social History Narrative  . None     Family History:  The patient's family history includes Coronary artery disease in her father; Heart attack in her father and paternal uncle; Heart disease in her paternal uncle; Hypertension in her father; Stroke in her father.   ROS:   Please see the history of present illness.    ROS All other systems reviewed and are negative.   PHYSICAL EXAM:   VS:  BP 110/74   Pulse 72   Ht 5\' 2"  (1.575 m)   Wt 136 lb 9.6 oz (62 kg)   LMP 04/17/2013   SpO2 99%   BMI 24.98 kg/m    GEN: Well nourished, well developed, in no acute distress , she appears healthy and fit HEENT: normal  Neck: no JVD, carotid bruits, or masses Cardiac: RRR; no murmurs, rubs, or gallops,no edema, 2+ radials, ulnar and pedal pulses bilaterally  Respiratory:  clear to auscultation bilaterally, normal work of breathing GI: soft, nontender, nondistended, + BS MS: no deformity or atrophy  Skin: warm and dry, no rash Neuro:  Alert and Oriented x 3, Strength and sensation are intact Psych: euthymic mood, full affect  Wt Readings from Last 3 Encounters:  12/10/16 136 lb 9.6 oz (62 kg)  09/14/16 135 lb 3.2 oz (61.3 kg)  08/17/16 135 lb 12.8 oz (61.6 kg)      Studies/Labs Reviewed:   EKG:  EKG is not ordered today.  The ekg ordered 08/17/16 demonstrates Sinus rhythm, normal tracing  Recent Labs: 08/17/2016: ALT 8; Brain Natriuretic Peptide <4.0; BUN 11; Creat 0.76; Hemoglobin 11.9; Platelets 278; Potassium 4.5; Sodium 138; TSH 1.43   Lipid Panel    Component Value Date/Time   CHOL 198 08/17/2016 0937   CHOL 147 02/13/2013 0902   TRIG 59 08/17/2016 0937   TRIG 72 02/13/2013 0902   HDL 64 08/17/2016 0937   HDL 43 02/13/2013 0902   CHOLHDL 3.1 08/17/2016 0937   VLDL 12 08/17/2016 0937   LDLCALC 122 08/17/2016 0937   LDLCALC 90 02/13/2013 0902    Additional studies/ records that were reviewed today include:  Notes from PCP, CXR    ASSESSMENT:    1. Chronic  diastolic heart failure ([place])   2. Exertional chest pain   3. Pure hypercholesterolemia   4. Benign essential HTN   5. Dyslipidemia   6. Medication management      PLAN:   1. CHF: Symptoms improved on a very low dose of diuretics. The presence of diastolic dysfunction in the absence of hypertension or left ventricular hypertrophy raises the concern for ischemic heart disease, but her stress echo was normal. 2. Angina: Exertional symptoms improved after diuretics. Unclear if her chest discomfort was an expression of heart failure or whether she does have underlying CAD, not detected by the stress echo. With a normal left ventricular systolic function and improved symptoms will focus on risk factor medication. 3.HLP: I have recommended that she restart statin with a target LDL of less than 100, preferably less than 70. She was on atorvastatin before and seemed to tolerated low, will restart that medication. 4. Her blood pressure is excellent without  medication. This is a questionable diagnosis.  Medication Adjustments/Labs and Tests Ordered: Current medicines are reviewed at length with the patient today.  Concerns regarding medicines are outlined above.  Medication changes, Labs and Tests ordered today are listed in the Patient Instructions below. Patient Instructions  Dr Sallyanne Kuster has recommended making the following medication changes: 1. START Atorvastatin 20 mg - take 1 tablet by mouth once daily  Your physician recommends that you return for lab work in 3 months - FASTING.  Dr Sallyanne Kuster recommends that you schedule a follow-up appointment in 3 months.  If you need a refill on your cardiac medications before your next appointment, please call your pharmacy.    Signed, Sanda Klein, MD  12/10/2016 8:41 AM    [facility] [address], [place], [place]  [address] Phone: [phone]; Fax: [phone]

## 2017-02-24 IMAGING — DX DG CHEST 2V
2 series · 2 of 2 positions shown · non-contrast
Comparison: 10/14/2011 chest radiograph.

CLINICAL DATA: Dyspnea on exertion

EXAM:
CHEST  2 VIEW

[chest pa]
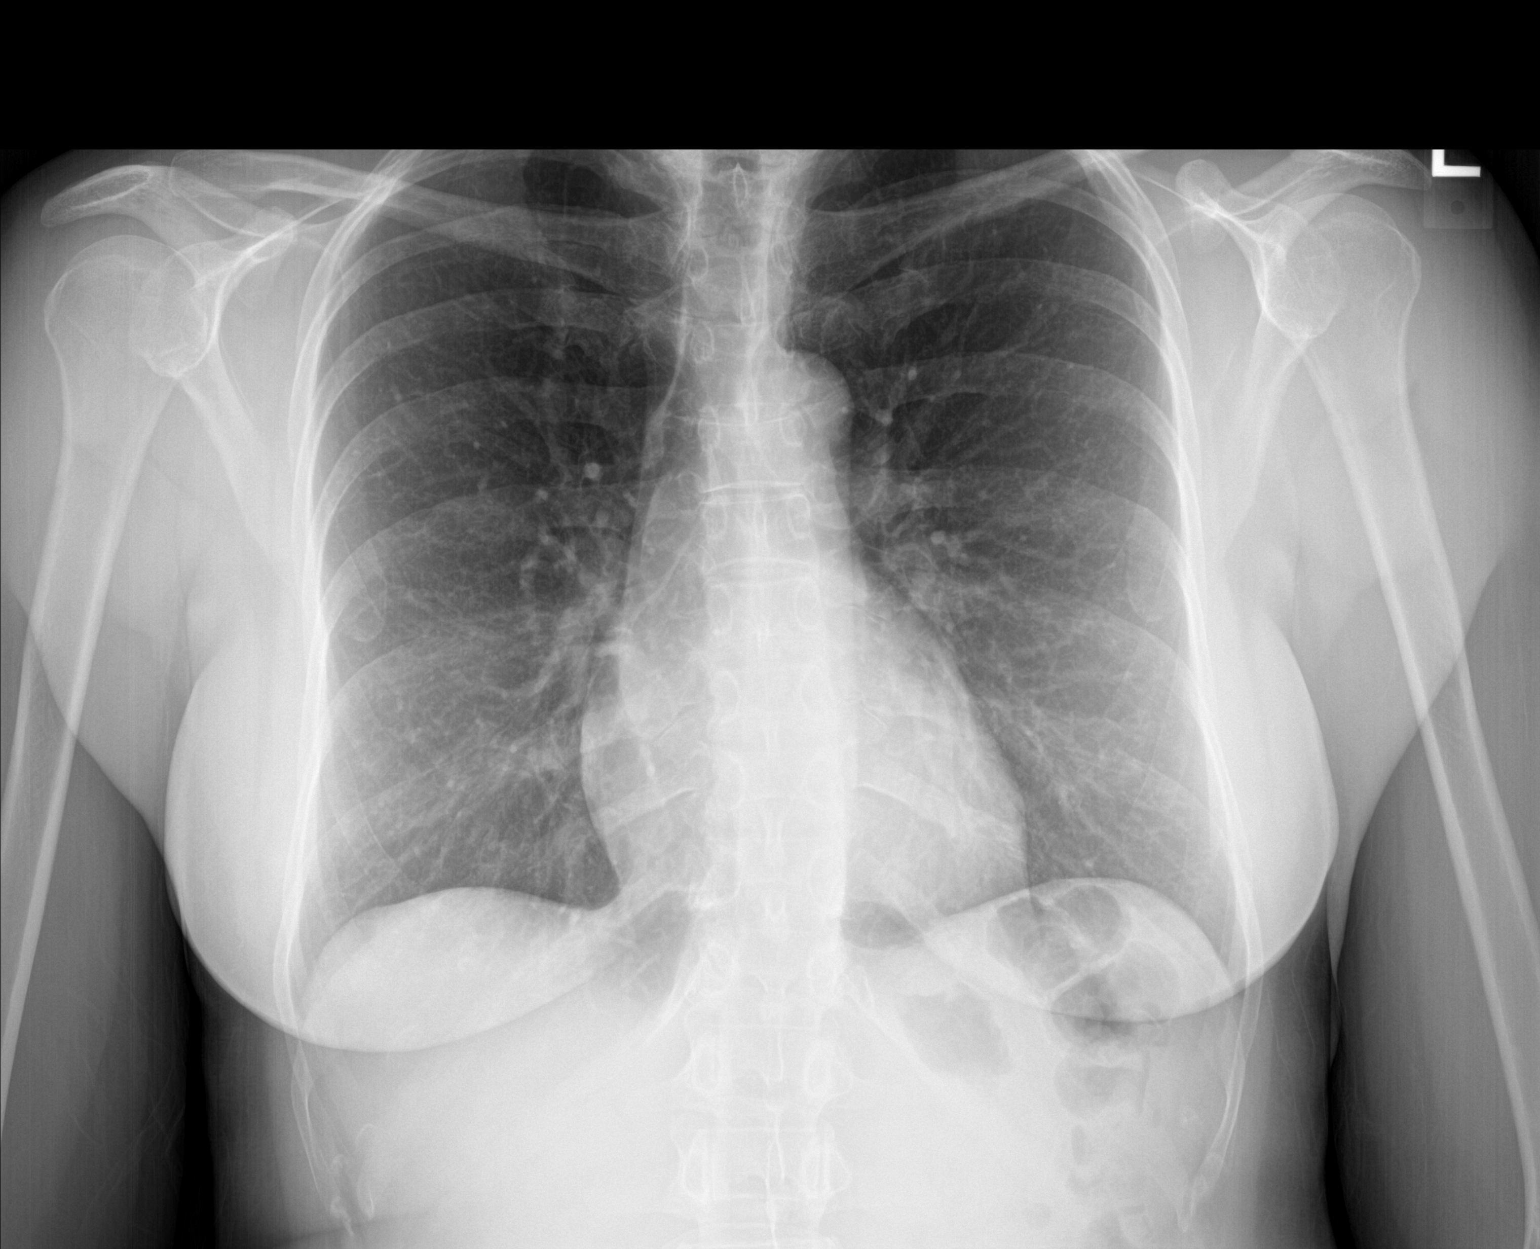

[chest lat]
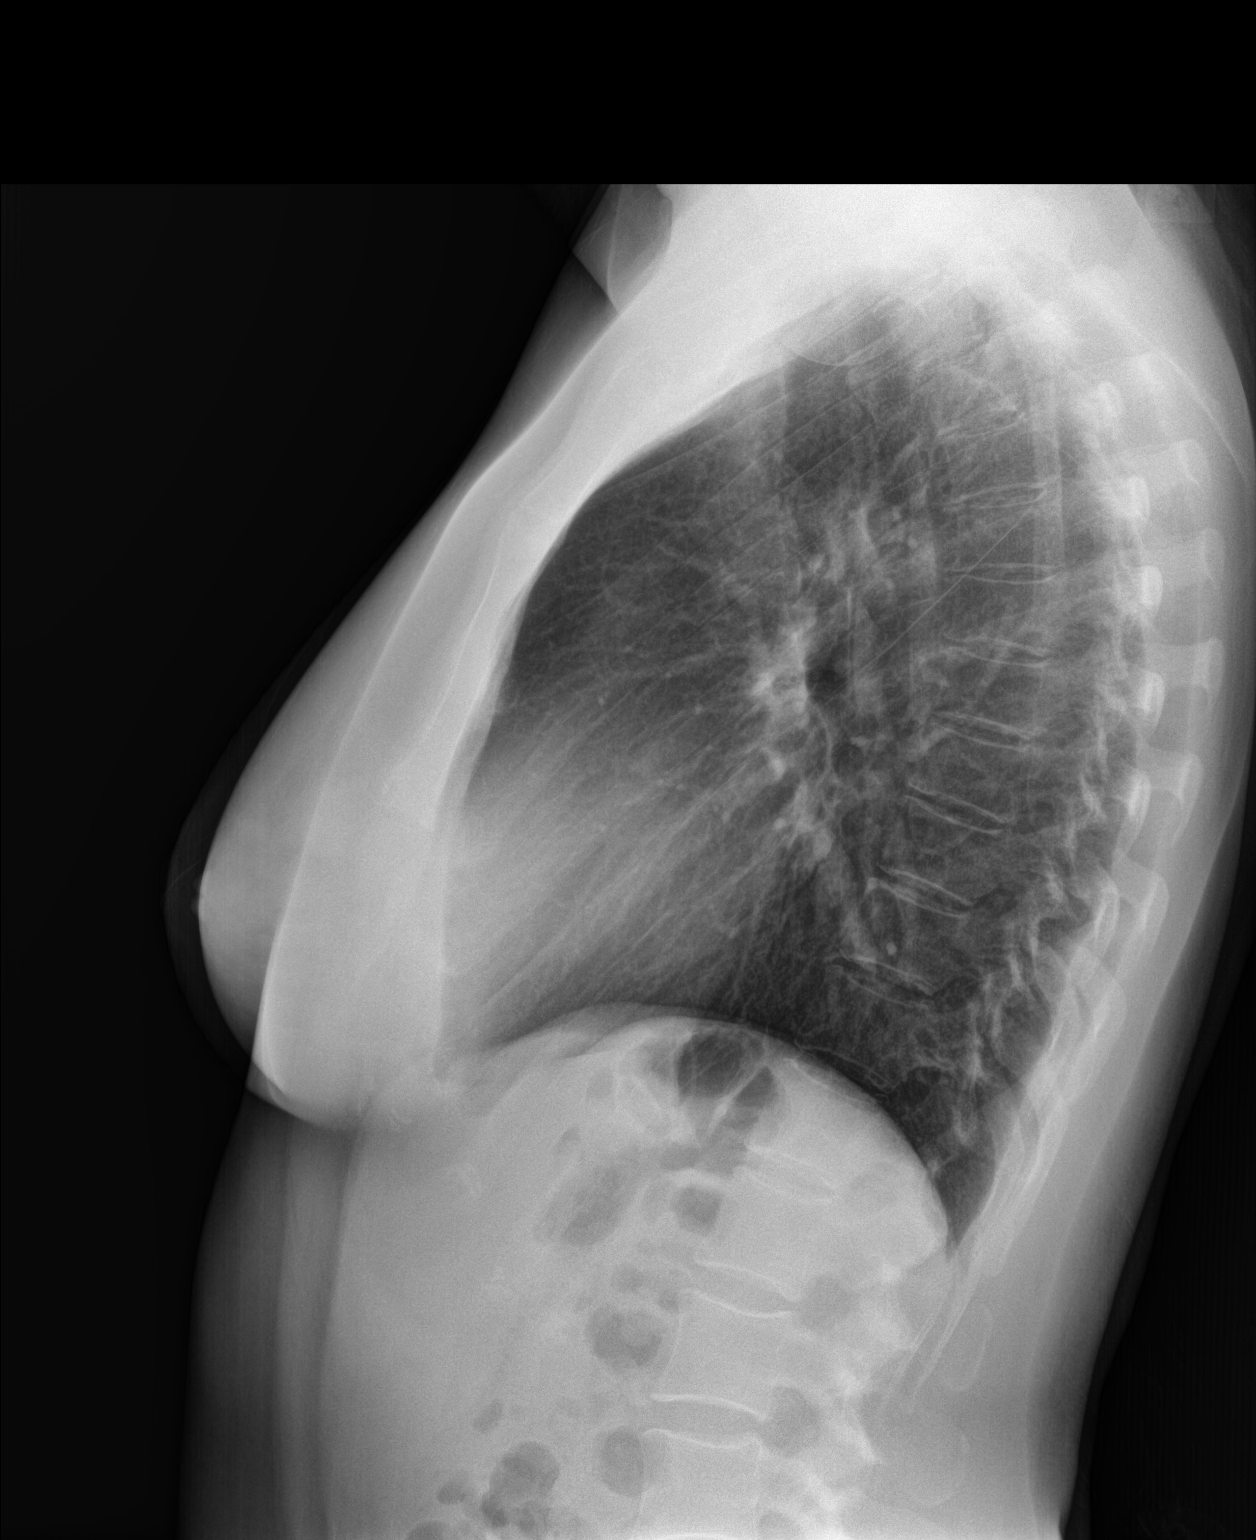

[2 of 2 positions shown; findings below may reference images not displayed]

FINDINGS: Stable cardiomediastinal silhouette with normal heart size. No
pneumothorax. No pleural effusion. Lungs appear clear, with no acute
consolidative airspace disease and no pulmonary edema.
IMPRESSION: No active cardiopulmonary disease.

## 2017-04-23 DIAGNOSIS — N6459 Other signs and symptoms in breast: Secondary | ICD-10-CM | POA: Diagnosis not present

## 2017-06-18 DIAGNOSIS — Z01419 Encounter for gynecological examination (general) (routine) without abnormal findings: Secondary | ICD-10-CM | POA: Diagnosis not present

## 2017-06-18 DIAGNOSIS — Z6824 Body mass index (BMI) 24.0-24.9, adult: Secondary | ICD-10-CM | POA: Diagnosis not present

## 2017-07-18 ENCOUNTER — Encounter: Payer: Self-pay | Admitting: Obstetrics and Gynecology

## 2017-07-25 DIAGNOSIS — E559 Vitamin D deficiency, unspecified: Secondary | ICD-10-CM | POA: Diagnosis not present

## 2017-08-08 ENCOUNTER — Ambulatory Visit (INDEPENDENT_AMBULATORY_CARE_PROVIDER_SITE_OTHER): Payer: 59 | Admitting: Family Medicine

## 2017-08-08 ENCOUNTER — Encounter: Payer: Self-pay | Admitting: Family Medicine

## 2017-08-08 ENCOUNTER — Ambulatory Visit (INDEPENDENT_AMBULATORY_CARE_PROVIDER_SITE_OTHER): Payer: 59

## 2017-08-08 VITALS — BP 136/76 | HR 82 | Temp 98.0°F | Resp 16 | Ht 63.78 in | Wt 133.0 lb

## 2017-08-08 DIAGNOSIS — F411 Generalized anxiety disorder: Secondary | ICD-10-CM | POA: Diagnosis not present

## 2017-08-08 DIAGNOSIS — M25522 Pain in left elbow: Secondary | ICD-10-CM

## 2017-08-08 DIAGNOSIS — M7712 Lateral epicondylitis, left elbow: Secondary | ICD-10-CM

## 2017-08-08 LAB — POCT SEDIMENTATION RATE: POCT SED RATE: 10 mm/h (ref 0–22)

## 2017-08-08 MED ORDER — TRAZODONE HCL 100 MG PO TABS: 100.0000 mg | ORAL_TABLET | Freq: Every evening | ORAL | 1 refills | Status: DC | PRN

## 2017-08-08 MED ORDER — PREDNISONE 20 MG PO TABS: ORAL_TABLET | ORAL | 0 refills | Status: DC

## 2017-08-08 NOTE — Progress Notes (Signed)
Subjective:    Patient ID: Nancy [name], female    DOB: May 18, 1966, 51 y.o.   MRN: [id number]  Chief Complaint  Patient presents with  . Arm Pain    left arm x 2 weeks     HPI  Nancy [name] Is a 51 year old woman who is here today with complaints of 2 weeks of left arm pain. This is my first time meeting this patient though I am listed as her PCP. She was last seen here 1 year prior.   Follows with cardiology Dr. Sallyanne Kuster for hypertension, hyperlipidemia, and diastolic heart failure.  Sudden onset lateral left elbow. 2 wks prior while vaccuming - can't hold her purse, hurts to adduct or flex arm.  Has been taking otc nsaids since - taking 2 tabs every 4 hours - 4x/d. No ice or compression. Did try ice/hot. No h/o any injury. Has been trying to keep moving it and it seems to be worse at [name] when she is not moving it - more stiff. No numbness/tingling, no weakness, no neck. No swelling or skin changes. No wrist, shoulder sxs.  Rt handed.  Patient does have a history of a positive in a 1-80 in a speckled pattern. She was seen by Dr. Charlestine [name], rheumatology 01/30/2016. Her sedimentation rate was normal at that time with a negative RF. At that time patient was complaining of bilateral elbow pain and Dr. Charlestine [name] thought her exam was more consistent with tennis elbow tendinitis. He recommended icing elbows every day for 20 minutes for 1 week then every other day for 2 weeks as well as starting a tennis elbow strap and when necessary Aleve. He did not note any arthritic changes nor signs of a connective tissue disease such as lupus, polymyositis, Sjogren's, scleroderma, or Raynaud's.  Past Medical History:  Diagnosis Date  . Anxiety   . Hyperlipidemia   . Hypertension    Past Surgical History:  Procedure Laterality Date  . ABDOMINAL HYSTERECTOMY    . DIAGNOSTIC LAPAROSCOPY    . ENDOMETRIAL ABLATION W/ NOVASURE    . LAPAROSCOPIC ASSISTED VAGINAL HYSTERECTOMY N/A 07/07/2013   Procedure:  LAPAROSCOPIC ASSISTED VAGINAL HYSTERECTOMY;  Surgeon: Darlyn Chamber, MD;  Location: [facility];  Service: Gynecology;  Laterality: N/A;  . TONSILLECTOMY    . TUBAL LIGATION     bilateral   Current Outpatient Prescriptions on File Prior to Visit  Medication Sig Dispense Refill  . atorvastatin (LIPITOR) 20 MG tablet Take 1 tablet (20 mg total) by mouth daily. 90 tablet 3   No current facility-administered medications on file prior to visit.    No Known Allergies Family History  Problem Relation Age of Onset  . Coronary artery disease Father   . Hypertension Father   . Stroke Father   . Heart attack Father   . Heart disease Paternal Uncle   . Heart attack Paternal Uncle    Social History   Social History  . Marital status: Married    Spouse name: N/A  . Number of children: N/A  . Years of education: N/A   Social History Main Topics  . Smoking status: Never Smoker  . Smokeless tobacco: Never Used  . Alcohol use 0.6 oz/week    1 Glasses of wine per week  . Drug use: No  . Sexual activity: Yes    Birth control/ protection: Other-see comments     Comment: pill   Other Topics Concern  . None   Social History Narrative  . None  Depression screen [facility] 2/9 08/08/2017 08/17/2016 01/24/2016 07/29/2015 03/31/2015  Decreased Interest 0 0 0 0 0  Down, Depressed, Hopeless 0 0 0 0 0  PHQ - 2 Score 0 0 0 0 0    Review of Systems See hpi    Objective:   Physical Exam  Constitutional: She is oriented to person, place, and time. She appears well-developed and well-nourished. No distress.  HENT:  Head: Normocephalic and atraumatic.  Right Ear: External ear normal.  Eyes: Conjunctivae are normal. No scleral icterus.  Pulmonary/Chest: Effort normal.  Musculoskeletal:       Left elbow: She exhibits normal range of motion, no swelling, no effusion and no deformity. Tenderness found. Lateral epicondyle tenderness noted. No radial head, no medial epicondyle and no olecranon process tenderness  noted.  Neurological: She is alert and oriented to person, place, and time.  Skin: Skin is warm and dry. She is not diaphoretic. No erythema.  Psychiatric: She has a normal mood and affect. Her behavior is normal.      BP 136/76   Pulse 82   Temp 98 F (36.7 C) (Oral)   Resp 16   Ht 5' 3.78" (1.62 m)   Wt 133 lb (60.3 kg)   LMP 04/17/2013   SpO2 99%   BMI 22.99 kg/m   Dg Elbow Complete Left (3+view)  Result Date: 08/08/2017 CLINICAL DATA:  Lateral epicondyle pain with motion.  No injury. EXAM: LEFT ELBOW - COMPLETE 3+ VIEW COMPARISON:  None. FINDINGS: There is no evidence of fracture, dislocation, or joint effusion. There is no evidence of arthropathy or other focal bone abnormality. Soft tissues are unremarkable. IMPRESSION: Negative. Electronically Signed   By: Nancy [name] M.D.   On: 08/08/2017 15:17   Results for orders placed or performed in visit on 08/08/17  POCT SEDIMENTATION RATE  Result Value Ref Range   POCT SED RATE 10 0 - 22 mm/hr    Assessment & Plan:  Last CPE April 2016. Schedule.  1. Left elbow pain   2. Lateral epicondylitis of left elbow - ice, nsaids, placed in band -wear constantly, start exercises while wearing when pain resolves.  3. Generalized anxiety disorder     Orders Placed This Encounter  Procedures  . DG ELBOW COMPLETE LEFT (3+VIEW)    Standing Status:   Future    Number of Occurrences:   1    Standing Expiration Date:   08/08/2018    Order Specific Question:   Reason for Exam (SYMPTOM  OR DIAGNOSIS REQUIRED)    Answer:   sudden onset lateral epicondyle pain, constant with any motion, nki, no prodromal sxs, no overuse injury    Order Specific Question:   Is the patient pregnant?    Answer:   No    Order Specific Question:   Preferred imaging location?    Answer:   External  . POCT SEDIMENTATION RATE    Meds ordered this encounter  Medications  . furosemide (LASIX) 20 MG tablet    Sig: Take 20 mg by mouth.  . predniSONE (DELTASONE) 20  MG tablet    Sig: Take 3 tabs qd x 3d, then 2 tabs qd x 3d then 1 tab qd x 3d.    Dispense:  18 tablet    Refill:  0  . traZODone (DESYREL) 100 MG tablet    Sig: Take 1 tablet (100 mg total) by mouth at bedtime as needed for sleep.    Dispense:  90 tablet  Refill:  1    Delman Cheadle, M.D.  Primary Care at [facility] [address] [place], [place] [address] [phone] phone [phone] fax  08/14/17 10:35 PM

## 2017-08-08 NOTE — Patient Instructions (Addendum)
Make sure you are icing your elbow for 20 minutes 3-4 times a day for the first week and then once or twice a day for the next month. Wear the tennis elbow strap constantly for the next month. If you continue to have any pain, make an appointment to recheck with our sports medicine physician Dr. Carlota Raspberry.     IF you received an x-ray today, you will receive an invoice from [facility]. Please contact [facility] at [phone] with questions or concerns regarding your invoice.   IF you received labwork today, you will receive an invoice from [facility]. Please contact [facility] at [phone] with questions or concerns regarding your invoice.   Our billing staff will not be able to assist you with questions regarding bills from these companies.  You will be contacted with the lab results as soon as they are available. The fastest way to get your results is to activate your My Chart account. Instructions are located on the last page of this paperwork. If you have not heard from [place] regarding the results in 2 weeks, please contact this office.      Tennis Elbow Tennis elbow (lateral epicondylitis) is inflammation of the outer tendons of your forearm close to your elbow. Your tendons attach your muscles to your bones. The outer tendons of your forearm are used to extend your wrist, and they attach on the outside part of your elbow. Tennis elbow is often found in people who play tennis, but anyone may get the condition from repeatedly extending the wrist or turning the forearm. What are the causes? This condition is caused by repeatedly extending your wrist and using your hands. It can result from sports or work that requires repetitive forearm movements. Tennis elbow may also be caused by an injury. What increases the risk? You have a higher risk of developing tennis elbow if you play tennis or another racquet sport. You also have a higher risk if you frequently use your hands for  work. This condition is also more likely to develop in:  Musicians.  Carpenters, painters, and plumbers.  Cooks.  Cashiers.  People who work in [employer].  Architect workers.  Butchers.  People who use computers.  What are the signs or symptoms? Symptoms of this condition include:  Pain and tenderness in your forearm and the outer part of your elbow. You may only feel the pain when you use your arm, or you may feel it even when you are not using your arm.  A burning feeling that runs from your elbow through your arm.  Weak grip in your hands.  How is this diagnosed? This condition may be diagnosed by medical history and physical exam. You may also have other tests, including:  X-rays.  MRI.  How is this treated? Your health care provider will recommend lifestyle adjustments, such as resting and icing your arm. Treatment may also include:  Medicines for inflammation. This may include shots of cortisone if your pain continues.  Physical therapy. This may include massage or exercises.  An elbow brace.  Surgery may eventually be recommended if your pain does not go away with treatment. Follow these instructions at home: Activity  Rest your elbow and wrist as directed by your health care provider. Try to avoid any activities that caused the problem until your health care provider says that you can do them again.  If a physical therapist teaches you exercises, do all of them as directed.  If you lift an object,  lift it with your palm facing upward. This lowers the stress on your elbow. Lifestyle  If your tennis elbow is caused by sports, check your equipment and make sure that: ? You are using it correctly. ? It is the best fit for you.  If your tennis elbow is caused by work, take breaks frequently, if you are able. Talk with your manager about how to best perform tasks in a way that is safe. ? If your tennis elbow is caused by computer use, talk with your  manager about any changes that can be made to your work environment. General instructions  If directed, apply ice to the painful area: ? Put ice in a plastic bag. ? Place a towel between your skin and the bag. ? Leave the ice on for 20 minutes, 2-3 times per day.  Take medicines only as directed by your health care provider.  If you were given a brace, wear it as directed by your health care provider.  Keep all follow-up visits as directed by your health care provider. This is important. Contact a health care provider if:  Your pain does not get better with treatment.  Your pain gets worse.  You have numbness or weakness in your forearm, hand, or fingers. This information is not intended to replace advice given to you by your health care provider. Make sure you discuss any questions you have with your health care provider. Document Released: 11/19/2005 Document Revised: 07/19/2016 Document Reviewed: 11/15/2014 Elsevier Interactive Patient Education  2018 [place] Ask your health care provider which exercises are safe for you. Do exercises exactly as told by your health care provider and adjust them as directed. It is normal to feel mild stretching, pulling, tightness, or discomfort as you do these exercises, but you should stop right away if you feel sudden pain or your pain gets worse. Do not begin these exercises until told by your health care provider. Stretching and range of motion exercises These exercises warm up your muscles and joints and improve the movement and flexibility of your elbow. These exercises also help to relieve pain, numbness, and tingling. Exercise A: Wrist extensor stretch 1. Extend your left / right elbow with your fingers pointing down. 2. Gently pull the palm of your left / right hand toward you until you feel a gentle stretch on the top of your forearm. 3. To increase the stretch, push your left / right hand toward the outer edge  or pinkie side of your forearm. 4. Hold this position for __________ seconds. Repeat __________ times. Complete this exercise __________ times a day. If directed by your health care provider, repeat this stretch except do it with a bent elbow this time. Exercise B: Wrist flexor stretch  1. Extend your left / right elbow and turn your palm upward. 2. Gently pull your left / right palm and fingertips back so your wrist extends and your fingers point more toward the ground. 3. You should feel a gentle stretch on the inside of your forearm. 4. Hold this position for __________ seconds. Repeat __________ times. Complete this exercise __________ times a day. If directed by your health care provider, repeat this stretch except do it with a bent elbow this time. Strengthening exercises These exercises build strength and endurance in your elbow. Endurance is the ability to use your muscles for a long time, even after they get tired. Exercise C: Wrist extensors  1. Sit with your left / right  forearm palm-down and fully supported on a table or countertop. Your elbow should be resting below the height of your shoulder. 2. Let your left / right wrist extend over the edge of the surface. 3. Loosely hold a __________ weight or a piece of rubber exercise band or tubing in your left / right hand. Slowly curl your left / right hand up toward your forearm. If you are using band or tubing, hold the band or tubing in place with your other hand to provide resistance. 4. Hold this position for __________ seconds. 5. Slowly return to the starting position. Repeat __________ times. Complete this exercise __________ times a day. Exercise D: Radial deviators  1. Stand with a __________ weight in your left / righthand. Or, sit while holding a rubber exercise band or tubing with your other arm supported on a table or countertop. Position your hand so your thumb is on top. 2. Raise your hand upward in front of you so  your thumb travels toward your forearm, or pull up on the rubber tubing. 3. Hold this position for __________ seconds. 4. Slowly return to the starting position. Repeat __________ times. Complete this exercise __________ times a day. Exercise E: Eccentric wrist extensors 1. Sit with your left / right forearm palm-down and fully supported on a table or countertop. Your elbow should be resting below the height of your shoulder. 2. If told by your health care provider, hold a __________ weight in your hand. 3. Let your left / right wrist extend over the edge of the surface. 4. Use your other hand to lift up your left / right hand toward your forearm. Keep your forearm on the table. 5. Using only the muscles in your left / right hand, slowly lower your hand back down to the starting position. Repeat __________ times. Complete this exercise __________ times a day. This information is not intended to replace advice given to you by your health care provider. Make sure you discuss any questions you have with your health care provider. Document Released: 11/19/2005 Document Revised: 07/25/2016 Document Reviewed: 08/18/2015 Elsevier Interactive Patient Education  [employer].

## 2017-08-23 ENCOUNTER — Telehealth: Payer: Self-pay | Admitting: Family Medicine

## 2017-08-23 DIAGNOSIS — M25522 Pain in left elbow: Secondary | ICD-10-CM

## 2017-08-23 DIAGNOSIS — M7712 Lateral epicondylitis, left elbow: Secondary | ICD-10-CM

## 2017-08-23 MED ORDER — TRAMADOL HCL 50 MG PO TABS: 50.0000 mg | ORAL_TABLET | Freq: Three times a day (TID) | ORAL | 0 refills | Status: DC | PRN

## 2017-08-23 NOTE — Telephone Encounter (Signed)
PT WOULD LIKE PAIN MEDICINE SHE STATES THAT TYLENOL AND ALEVE ISN'T WORKING

## 2017-08-23 NOTE — Telephone Encounter (Signed)
Please advise. Does pt need OV ?

## 2017-08-23 NOTE — Telephone Encounter (Signed)
Will refer to ortho for further eval. Ok to call in tramadol 50mg  q8hrs prn. #15  No refills that I just rx'ed

## 2017-08-26 NOTE — Telephone Encounter (Signed)
Spoke with patient.

## 2017-09-02 ENCOUNTER — Encounter (INDEPENDENT_AMBULATORY_CARE_PROVIDER_SITE_OTHER): Payer: Self-pay | Admitting: Orthopedic Surgery

## 2017-09-02 ENCOUNTER — Ambulatory Visit (INDEPENDENT_AMBULATORY_CARE_PROVIDER_SITE_OTHER): Payer: 59

## 2017-09-02 ENCOUNTER — Ambulatory Visit (INDEPENDENT_AMBULATORY_CARE_PROVIDER_SITE_OTHER): Payer: 59 | Admitting: Orthopedic Surgery

## 2017-09-02 DIAGNOSIS — M25522 Pain in left elbow: Secondary | ICD-10-CM | POA: Diagnosis not present

## 2017-09-05 NOTE — Progress Notes (Signed)
Office Visit Note   Patient: Nancy [name]           Date of Birth: 16-Jul-1966           MRN: [id number] Visit Date: 09/02/2017 Requested by: Shawnee Knapp, MD [address] [place], [place] [address] PCP: Shawnee Knapp, MD  Subjective: Chief Complaint  Patient presents with  . Left Elbow - Pain    HPI: [name] is a 51 year old patient with left elbow pain.  Patient has had acute pain in the left elbow for 4 weeks.  It started after vacuuming.  Patient states he they're unable to lift anything with the left arm.  Reports a lot of aching in the elbow at night.  Had no lifting problems before.  Hurts for the patient to straighten the arm.  No medications except Advil and ibuprofen.  Patient had to try to type and pull files.  Denies any numbness and tingling in the arm and denies any neck pain.  Braces been tried without any help.  BenGay and heating pad helps a little bit.              ROS: All systems reviewed are negative as they relate to the chief complaint within the history of present illness.  Patient denies  fevers or chills.   Assessment & Plan: Visit Diagnoses:  1. Pain in left elbow     Plan: Impression is left elbow pain with normal radiographs and symptoms consistent possibly with tendinitis.  I like to try topical anti-inflammatory for [redacted] weeks along with a course of physical therapy for with modalities.  4 week return to decide for or against MRI scanning if the patient is no better  Follow-Up Instructions: Return in about 4 weeks (around 09/30/2017).   Orders:  Orders Placed This Encounter  Procedures  . XR Elbow 2 Views Left   No orders of the defined types were placed in this encounter.     Procedures: No procedures performed   Clinical Data: No additional findings.  Objective: Vital Signs: LMP 04/17/2013   Physical Exam:   Constitutional: Patient appears well-developed HEENT:  Head: Normocephalic Eyes:EOM are normal Neck: Normal range of  motion Cardiovascular: Normal rate Pulmonary/chest: Effort normal Neurologic: Patient is alert Skin: Skin is warm Psychiatric: Patient has normal mood and affect    Ortho Exam: Orthopedic exam demonstrates painful range of motion particularly with flexion.  Patient has lost the last 10-15 of full flexion left versus right.  Extension is full.  There is tenderness on the lateral condyle or the medial condyle.  Some tenderness in the radial tunnel.  Motor sensory function to the hand is intact with symmetric grip strength bilaterally.  There is pain with resisted finger extension and elbow extension and wrist extension on the left-hand side.  No other masses lymph adenopathy or skin changes noted in the elbow region  Specialty Comments:  No specialty comments available.  Imaging: No results found.   PMFS History: Patient Active Problem List   Diagnosis Date Noted  . Chronic diastolic heart failure ([place]) 12/10/2016  . Exertional chest pain 12/10/2016  . Status post laparoscopic assisted vaginal hysterectomy (LAVH) 07/07/2013    Class: Status post  . Benign essential HTN 11/03/2012  . Dyspnea 07/31/2012  . Hyperlipidemia 07/30/2012  . Depression 07/30/2012   Past Medical History:  Diagnosis Date  . Anxiety   . Hyperlipidemia   . Hypertension     Family History  Problem Relation Age  of Onset  . Coronary artery disease Father   . Hypertension Father   . Stroke Father   . Heart attack Father   . Heart disease Paternal Uncle   . Heart attack Paternal Uncle     Past Surgical History:  Procedure Laterality Date  . ABDOMINAL HYSTERECTOMY    . DIAGNOSTIC LAPAROSCOPY    . ENDOMETRIAL ABLATION W/ NOVASURE    . LAPAROSCOPIC ASSISTED VAGINAL HYSTERECTOMY N/A 07/07/2013   Procedure: LAPAROSCOPIC ASSISTED VAGINAL HYSTERECTOMY;  Surgeon: Darlyn Chamber, MD;  Location: [facility];  Service: Gynecology;  Laterality: N/A;  . TONSILLECTOMY    . TUBAL LIGATION     bilateral   Social History    Occupational History  . Not on file.   Social History Main Topics  . Smoking status: Never Smoker  . Smokeless tobacco: Never Used  . Alcohol use 0.6 oz/week    1 Glasses of wine per week  . Drug use: No  . Sexual activity: Yes    Birth control/ protection: Other-see comments     Comment: pill

## 2017-11-05 ENCOUNTER — Ambulatory Visit: Payer: 59 | Admitting: Cardiovascular Disease

## 2017-11-05 ENCOUNTER — Encounter: Payer: Self-pay | Admitting: Cardiovascular Disease

## 2017-11-05 VITALS — BP 150/90 | HR 66 | Ht 62.5 in | Wt 136.6 lb

## 2017-11-05 DIAGNOSIS — I5032 Chronic diastolic (congestive) heart failure: Secondary | ICD-10-CM | POA: Diagnosis not present

## 2017-11-05 DIAGNOSIS — Z0389 Encounter for observation for other suspected diseases and conditions ruled out: Secondary | ICD-10-CM | POA: Diagnosis not present

## 2017-11-05 DIAGNOSIS — E7800 Pure hypercholesterolemia, unspecified: Secondary | ICD-10-CM | POA: Diagnosis not present

## 2017-11-05 DIAGNOSIS — R0602 Shortness of breath: Secondary | ICD-10-CM | POA: Diagnosis not present

## 2017-11-05 DIAGNOSIS — I10 Essential (primary) hypertension: Secondary | ICD-10-CM

## 2017-11-05 MED ORDER — FUROSEMIDE 20 MG PO TABS: 20.0000 mg | ORAL_TABLET | Freq: Every day | ORAL | 3 refills | Status: DC

## 2017-11-05 MED ORDER — ATORVASTATIN CALCIUM 20 MG PO TABS: 20.0000 mg | ORAL_TABLET | Freq: Every day | ORAL | 3 refills | Status: DC

## 2017-11-05 MED ORDER — CARVEDILOL 6.25 MG PO TABS: 6.2500 mg | ORAL_TABLET | Freq: Two times a day (BID) | ORAL | 3 refills | Status: DC

## 2017-11-05 NOTE — Patient Instructions (Addendum)
Medication Instructions: Dr Sallyanne Kuster has recommended making the following medication changes: 1. START Carvedilol 6.25 mg - take 1 tablet by mouth twice daily  Labwork: Your physician recommends that you return for lab work prior to your test.  Testing/Procedures: 1. Cardiac CT - see instructions listed below  Follow-up: Your physician recommends that you schedule a follow-up appointment in 4-6 weeks with a PA/NP.  Dr Sallyanne Kuster recommends that you schedule a follow-up appointment first available.  If you need a refill on your cardiac medications before your next appointment, please call your pharmacy.   Please arrive at the [facility] main entrance of [facility] at 30-45 minutes prior to test start time.  [facility] [address] [place], [place] [address] [phone]  Proceed to the [facility] Radiology Department (First Floor).  Please follow these instructions carefully (unless otherwise directed):  Hold all erectile dysfunction medications at least 48 hours prior to test.  On the Night Before the Test: . Drink plenty of water. . Do not consume any caffeinated/decaffeinated beverages or chocolate 12 hours prior to your test. . Do not take any antihistamines 12 hours prior to your test.  On the Day of the Test: . Drink plenty of water. Do not drink any water within one hour of the test. . Do not eat any food 4 hours prior to the test. . You may take your regular medications prior to the test. . HOLD Furosemide morning of the test.  After the Test: . Drink plenty of water. . After receiving IV contrast, you may experience a mild flushed feeling. This is normal. . On occasion, you may experience a mild rash up to 24 hours after the test. This is not dangerous. If this occurs, you can take Benadryl 25 mg and increase your fluid intake. . If you experience trouble breathing, this can be serious. If it is severe call 911 IMMEDIATELY. If it is mild,  please call our office. . If you take any of these medications: Glipizide/Metformin, Avandament, Glucavance, please do not take 48 hours after completing test.

## 2017-11-05 NOTE — Progress Notes (Signed)
Cardiology Consultation Note    Date:  11/05/2017   ID:  Nancy [name], DOB 11-Aug-1966, MRN [id number]  PCP:  Philis Fendt, [facility]  Cardiologist:   Sanda Klein, MD  Referred in consultation: By Philis Fendt, PA-C, for exertional chest pain and exertional dyspnea Chief Complaint  Patient presents with  . Follow-up    History of Present Illness:  Nancy [name] is a 51 y.o. female with a strong family history of premature coronary artery disease but without any other coronary risk factors, presenting with complaints of exertional and exertional dyspnea.  In November 2017 she had a stress echocardiogram. The study showed evidence of diastolic dysfunction with elevated filling pressures, systolic function was normal and the stress echo did not show exercise-induced wall motion abnormalities. After starting a low-dose of diuretics, her symptoms improved.  During her stress echo the highest recorded blood pressure was 190/51 mmHg, not consistent with a true hypertensive response. She has not checked her blood pressure since she was last seen in our office.  She stopped taking the diuretic and the statin in May because she ran out of the prescription.  Also in May she noticed worsening exertional dyspnea, but she is confident that this began before she ran out of the diuretics.  As long as she moves slowly she does not have any complaints, but if she tries to hurry she will develop dyspnea.  She can climb a flight of stairs if she takes her time.  She has had shortness of breath with intercourse.  If she pushes herself to exercise she is afraid that she will pass out because of the severity of her dyspnea.  There is no associated exertional chest pain.  The patient specifically denies any chest pain at rest or with exertion, dyspnea at rest, orthopnea, paroxysmal nocturnal dyspnea, syncope, palpitations, focal neurological deficits, intermittent claudication, lower extremity edema, unexplained weight  gain, cough, hemoptysis or wheezing.  She had a lot of problems with left arm pain starting in July.  This lasted for months and was clearly positional and associated with symptoms of radiculopathy (tingling in her left arm).  It has now resolved.  Steroids appeared to help, suggesting an inflammatory component.  Diastolic dysfunction was present despite the absence of left ventricular hypertrophy, so the possibility of coronary artery disease is still an issue.    She had a partial hysterectomy roughly 5 years ago and is currently having some perimenopausal hot flash symptoms. Her father had heart disease starting in his 67s and has undergone both stents and bypass surgery. Her mother started having coronary problems in her 35s and had an aborted infarction with percutaneous revascularization.   Past Medical History:  Diagnosis Date  . Anxiety   . Hyperlipidemia   . Hypertension     Past Surgical History:  Procedure Laterality Date  . ABDOMINAL HYSTERECTOMY    . DIAGNOSTIC LAPAROSCOPY    . ENDOMETRIAL ABLATION W/ NOVASURE    . LAPAROSCOPIC ASSISTED VAGINAL HYSTERECTOMY N/A 07/07/2013   Procedure: LAPAROSCOPIC ASSISTED VAGINAL HYSTERECTOMY;  Surgeon: Darlyn Chamber, MD;  Location: [facility];  Service: Gynecology;  Laterality: N/A;  . TONSILLECTOMY    . TUBAL LIGATION     bilateral    Current Medications: Outpatient Medications Prior to Visit  Medication Sig Dispense Refill  . traZODone (DESYREL) 100 MG tablet Take 1 tablet (100 mg total) by mouth at bedtime as needed for sleep. 90 tablet 1  . furosemide (LASIX) 20 MG tablet Take  20 mg by mouth.    Marland Kitchen atorvastatin (LIPITOR) 20 MG tablet Take 1 tablet (20 mg total) by mouth daily. 90 tablet 3  . predniSONE (DELTASONE) 20 MG tablet Take 3 tabs qd x 3d, then 2 tabs qd x 3d then 1 tab qd x 3d. 18 tablet 0  . traMADol (ULTRAM) 50 MG tablet Take 1 tablet (50 mg total) by mouth every 8 (eight) hours as needed. 15 tablet 0   No  facility-administered medications prior to visit.      Allergies:   Patient has no known allergies.   Social History   Socioeconomic History  . Marital status: Married    Spouse name: None  . Number of children: None  . Years of education: None  . Highest education level: None  Social Needs  . Financial resource strain: None  . Food insecurity - worry: None  . Food insecurity - inability: None  . Transportation needs - medical: None  . Transportation needs - non-medical: None  Occupational History  . None  Tobacco Use  . Smoking status: Never Smoker  . Smokeless tobacco: Never Used  Substance and Sexual Activity  . Alcohol use: Yes    Alcohol/week: 0.6 oz    Types: 1 Glasses of wine per week  . Drug use: No  . Sexual activity: Yes    Birth control/protection: Other-see comments    Comment: pill  Other Topics Concern  . None  Social History Narrative  . None     Family History:  The patient's family history includes Coronary artery disease in her father; Heart attack in her father and paternal uncle; Heart disease in her paternal uncle; Hypertension in her father; Stroke in her father.   ROS:   Please see the history of present illness.    ROS All other systems reviewed and are negative.   PHYSICAL EXAM:   VS:  BP (!) 150/90   Pulse 66   Ht 5' 2.5" (1.588 m)   Wt 136 lb 9.6 oz (62 kg)   LMP 04/17/2013   BMI 24.59 kg/m    Rechecked her blood pressure, left arm 160/92 mmHg, left arm 162/95 mmHg. GEN: Well nourished, well developed, in no acute distress , she appears healthy and fit HEENT: normal  Neck: no JVD, carotid bruits, or masses Cardiac: RRR; no murmurs, rubs, or gallops,no edema, 2+ radials, ulnar and pedal pulses bilaterally  Respiratory:  clear to auscultation bilaterally, normal work of breathing GI: soft, nontender, nondistended, + BS MS: no deformity or atrophy  Skin: warm and dry, no rash Neuro:  Alert and Oriented x 3, Strength and sensation  are intact Psych: euthymic mood, full affect  Wt Readings from Last 3 Encounters:  11/05/17 136 lb 9.6 oz (62 kg)  08/08/17 133 lb (60.3 kg)  12/10/16 136 lb 9.6 oz (62 kg)      Studies/Labs Reviewed:   EKG:  EKG is ordered today. It shows sinus rhythm, normal tracing  Recent Labs: No results found for requested labs within last 8760 hours.   Lipid Panel    Component Value Date/Time   CHOL 198 08/17/2016 0937   CHOL 147 02/13/2013 0902   TRIG 59 08/17/2016 0937   TRIG 72 02/13/2013 0902   HDL 64 08/17/2016 0937   HDL 43 02/13/2013 0902   CHOLHDL 3.1 08/17/2016 0937   VLDL 12 08/17/2016 0937   LDLCALC 122 08/17/2016 0937   LDLCALC 90 02/13/2013 0902    ASSESSMENT:  1. Under observation for suspected coronary artery disease   2. Chronic diastolic heart failure ([place])   3. Hypercholesterolemia   4. Essential hypertension   5. Shortness of breath      PLAN:   1. CHF: She continues to describe symptoms suggestive of congestive heart failure, exertional dyspnea NYHA functional class II.  She does not have any clinical findings of hypervolemia.  She had evidence of diastolic dysfunction and elevated filling pressures on her echocardiogram, without left ventricular hypertrophy or left atrial enlargement.  Her blood pressure is quite high today, but this is the first time really detected significant hypertension.  The suspicion for coronary artery disease is still there.  We will try to get a coronary CT angiogram in parallel with restarting her diuretic and starting an antihypertensive agent. 2.HLP: Restart statin therapy, check lipids in several months.  Depending on the results of the CT angiogram per target LDL cholesterol will be less than 100, maybe even less than 70. 3. HTN: Her blood pressure is quite high today, very different from her previous office visit.  We will start carvedilol 6.25 mg twice daily.  She should not take her furosemide on the day of her CT  angiogram and I instructed her to hold it for that day.  Reevaluate in clinic after she has her CT angiogram.  Medication Adjustments/Labs and Tests Ordered: Current medicines are reviewed at length with the patient today.  Concerns regarding medicines are outlined above.  Medication changes, Labs and Tests ordered today are listed in the Patient Instructions below. Patient Instructions  Medication Instructions: Dr Sallyanne Kuster has recommended making the following medication changes: 1. START Carvedilol 6.25 mg - take 1 tablet by mouth twice daily  Labwork: Your physician recommends that you return for lab work prior to your test.  Testing/Procedures: 1. Cardiac CT - see instructions listed below  Follow-up: Your physician recommends that you schedule a follow-up appointment in 4-6 weeks with a PA/NP.  Dr Sallyanne Kuster recommends that you schedule a follow-up appointment first available.  If you need a refill on your cardiac medications before your next appointment, please call your pharmacy.   Please arrive at the [facility] main entrance of [facility] at 30-45 minutes prior to test start time.  [facility] [address] [place], [place] [address] [phone]  Proceed to the [facility] Radiology Department (First Floor).  Please follow these instructions carefully (unless otherwise directed):  Hold all erectile dysfunction medications at least 48 hours prior to test.  On the Night Before the Test: . Drink plenty of water. . Do not consume any caffeinated/decaffeinated beverages or chocolate 12 hours prior to your test. . Do not take any antihistamines 12 hours prior to your test.  On the Day of the Test: . Drink plenty of water. Do not drink any water within one hour of the test. . Do not eat any food 4 hours prior to the test. . You may take your regular medications prior to the test. . HOLD Furosemide morning of the test.  After the Test: . Drink  plenty of water. . After receiving IV contrast, you may experience a mild flushed feeling. This is normal. . On occasion, you may experience a mild rash up to 24 hours after the test. This is not dangerous. If this occurs, you can take Benadryl 25 mg and increase your fluid intake. . If you experience trouble breathing, this can be serious. If it is severe call  911 IMMEDIATELY. If it is mild, please call our office. . If you take any of these medications: Glipizide/Metformin, Avandament, Glucavance, please do not take 48 hours after completing test.     Signed, Sanda Klein, MD  11/05/2017 11:17 AM    [address], [place], [place]  [address] Phone: [phone]; Fax: [phone]

## 2017-12-03 DIAGNOSIS — Z955 Presence of coronary angioplasty implant and graft: Secondary | ICD-10-CM | POA: Insufficient documentation

## 2017-12-04 DIAGNOSIS — R0602 Shortness of breath: Secondary | ICD-10-CM | POA: Diagnosis not present

## 2017-12-04 DIAGNOSIS — Z0389 Encounter for observation for other suspected diseases and conditions ruled out: Secondary | ICD-10-CM | POA: Diagnosis not present

## 2017-12-05 LAB — BASIC METABOLIC PANEL
BUN / CREAT RATIO: 15 (ref 9–23)
BUN: 11 mg/dL (ref 6–24)
CO2: 23 mmol/L (ref 20–29)
CREATININE: 0.72 mg/dL (ref 0.57–1.00)
Calcium: 9.8 mg/dL (ref 8.7–10.2)
Chloride: 105 mmol/L (ref 96–106)
GFR calc Af Amer: 112 mL/min/{1.73_m2} (ref >59)
GFR, EST NON AFRICAN AMERICAN: 97 mL/min/{1.73_m2} (ref >59)
Glucose: 82 mg/dL (ref 65–99)
POTASSIUM: 4.5 mmol/L (ref 3.5–5.2)
SODIUM: 142 mmol/L (ref 134–144)

## 2017-12-11 ENCOUNTER — Telehealth: Payer: Self-pay | Admitting: Cardiovascular Disease

## 2017-12-11 ENCOUNTER — Ambulatory Visit (HOSPITAL_COMMUNITY)
Admission: RE | Admit: 2017-12-11 | Discharge: 2017-12-11 | Disposition: A | Discharge status: Home / Self Care | Payer: 59 | Source: Ambulatory Visit | Attending: Cardiovascular Disease | Admitting: Cardiovascular Disease

## 2017-12-11 ENCOUNTER — Ambulatory Visit (HOSPITAL_COMMUNITY): Admission: RE | Admit: 2017-12-11 | Payer: 59 | Source: Ambulatory Visit

## 2017-12-11 DIAGNOSIS — Z0389 Encounter for observation for other suspected diseases and conditions ruled out: Secondary | ICD-10-CM | POA: Diagnosis not present

## 2017-12-11 DIAGNOSIS — R079 Chest pain, unspecified: Secondary | ICD-10-CM | POA: Diagnosis not present

## 2017-12-11 DIAGNOSIS — R0602 Shortness of breath: Secondary | ICD-10-CM | POA: Insufficient documentation

## 2017-12-11 MED ORDER — METOPROLOL TARTRATE 5 MG/5ML IV SOLN
INTRAVENOUS | Status: AC
  Administered 2017-12-11: 09:00:00
  Filled 2017-12-11: qty 5

## 2017-12-11 MED ORDER — IOPAMIDOL (ISOVUE-370) INJECTION 76%
INTRAVENOUS | Status: AC
  Administered 2017-12-11: 80 mL
  Filled 2017-12-11: qty 100

## 2017-12-11 MED ORDER — NITROGLYCERIN 0.4 MG SL SUBL
SUBLINGUAL_TABLET | SUBLINGUAL | Status: AC
  Administered 2017-12-11: 09:00:00
  Filled 2017-12-11: qty 2

## 2017-12-11 NOTE — Telephone Encounter (Signed)
New message  Pt call requesting to speak with RN. She states he is returning Economist. Please call back to discuss

## 2017-12-11 NOTE — Telephone Encounter (Signed)
Results reviewed. Patient scheduled for follow-up 1/141/19 at 10:20a with Dr C.

## 2017-12-16 ENCOUNTER — Ambulatory Visit: Payer: 59 | Admitting: Cardiovascular Disease

## 2017-12-20 ENCOUNTER — Encounter: Payer: Self-pay | Admitting: Cardiology

## 2017-12-20 ENCOUNTER — Ambulatory Visit: Payer: 59 | Admitting: Cardiology

## 2017-12-20 ENCOUNTER — Encounter (INDEPENDENT_AMBULATORY_CARE_PROVIDER_SITE_OTHER): Payer: Self-pay

## 2017-12-20 VITALS — BP 138/72 | HR 63 | Ht 63.0 in | Wt 126.0 lb

## 2017-12-20 DIAGNOSIS — I25118 Atherosclerotic heart disease of native coronary artery with other forms of angina pectoris: Secondary | ICD-10-CM

## 2017-12-20 DIAGNOSIS — I2510 Atherosclerotic heart disease of native coronary artery without angina pectoris: Secondary | ICD-10-CM | POA: Insufficient documentation

## 2017-12-20 DIAGNOSIS — I10 Essential (primary) hypertension: Secondary | ICD-10-CM | POA: Diagnosis not present

## 2017-12-20 DIAGNOSIS — I5032 Chronic diastolic (congestive) heart failure: Secondary | ICD-10-CM | POA: Diagnosis not present

## 2017-12-20 DIAGNOSIS — R0609 Other forms of dyspnea: Secondary | ICD-10-CM | POA: Diagnosis not present

## 2017-12-20 DIAGNOSIS — Z8249 Family history of ischemic heart disease and other diseases of the circulatory system: Secondary | ICD-10-CM

## 2017-12-20 DIAGNOSIS — E7800 Pure hypercholesterolemia, unspecified: Secondary | ICD-10-CM | POA: Diagnosis not present

## 2017-12-20 DIAGNOSIS — Z9861 Coronary angioplasty status: Secondary | ICD-10-CM

## 2017-12-20 DIAGNOSIS — R0600 Dyspnea, unspecified: Secondary | ICD-10-CM

## 2017-12-20 MED ORDER — ASPIRIN EC 81 MG PO TBEC: 81.0000 mg | DELAYED_RELEASE_TABLET | Freq: Every day | ORAL | 3 refills | Status: AC

## 2017-12-20 NOTE — Assessment & Plan Note (Signed)
Father had MI/ CABG in his 37's

## 2017-12-20 NOTE — H&P (View-Only) (Signed)
12/20/2017 Nancy [name]   November 08, 1966  [id number]  Primary Physician Shawnee Knapp, MD Primary Cardiologist: Dr Sallyanne Kuster  HPI:  52 y/o female with a family history of CAD- (her father had an MI in his 48's and is s/p CABG), and HLD followed by Dr Sallyanne Kuster with a history of DOE and past chest pain. Prior echo 10/19/16 showed an EF of 55-60% with grade 2 DD.  A stress echo Nov 2017 was negative for ischemia. Because of continued symptoms she had a coronary CTA 12/11/17. Unfortunately this was a non diagnostic study due to bolus timing. She did have CAD, possibly > 50% in her LAD. Her Ca++ score was 245-99% for her age. The pt 's main complaint to me is dyspnea with exertion. She can walk slowly but if she rushes she gets significantly SOB and has to stop.    Current Outpatient Medications  Medication Sig Dispense Refill  . atorvastatin (LIPITOR) 20 MG tablet Take 1 tablet (20 mg total) by mouth daily. 90 tablet 3  . carvedilol (COREG) 6.25 MG tablet Take 1 tablet (6.25 mg total) by mouth 2 (two) times daily. 180 tablet 3  . furosemide (LASIX) 20 MG tablet Take 1 tablet (20 mg total) by mouth daily. 90 tablet 3  . traZODone (DESYREL) 100 MG tablet Take 1 tablet (100 mg total) by mouth at bedtime as needed for sleep. 90 tablet 1   No current facility-administered medications for this visit.     No Known Allergies  Past Medical History:  Diagnosis Date  . Anxiety   . CAD (coronary artery disease)    Seen on CTA  . Hyperlipidemia   . Hypertension     Social History   Socioeconomic History  . Marital status: Married    Spouse name: Not on file  . Number of children: Not on file  . Years of education: Not on file  . Highest education level: Not on file  Social Needs  . Financial resource strain: Not on file  . Food insecurity - worry: Not on file  . Food insecurity - inability: Not on file  . Transportation needs - medical: Not on file  . Transportation needs - non-medical: Not on  file  Occupational History  . Not on file  Tobacco Use  . Smoking status: Never Smoker  . Smokeless tobacco: Never Used  Substance and Sexual Activity  . Alcohol use: Yes    Alcohol/week: 0.6 oz    Types: 1 Glasses of wine per week  . Drug use: No  . Sexual activity: Yes    Birth control/protection: Other-see comments    Comment: pill  Other Topics Concern  . Not on file  Social History Narrative  . Not on file     Family History  Problem Relation Age of Onset  . Coronary artery disease Father   . Hypertension Father   . Stroke Father   . Heart attack Father   . Heart disease Paternal Uncle   . Heart attack Paternal Uncle      Review of Systems: General: negative for chills, fever, night sweats or weight changes.  Cardiovascular: negative for chest pain, dyspnea on exertion, edema, orthopnea, palpitations, paroxysmal nocturnal dyspnea or shortness of breath Dermatological: negative for rash Respiratory: negative for cough or wheezing Urologic: negative for hematuria Abdominal: negative for nausea, vomiting, diarrhea, bright red blood per rectum, melena, or hematemesis Neurologic: negative for visual changes, syncope, or dizziness All other systems reviewed and  are otherwise negative except as noted above.    Blood pressure 138/72, pulse 63, height 5\' 3"  (1.6 m), weight 126 lb (57.2 kg), last menstrual period 04/17/2013.  General appearance: alert, cooperative and no distress Neck: no carotid bruit and no JVD Lungs: clear to auscultation bilaterally Heart: regular rate and rhythm Abdomen: soft, non-tender; bowel sounds normal; no masses,  no organomegaly Extremities: extremities normal, atraumatic, no cyanosis or edema Pulses: 2+ and symmetric Skin: Skin color, texture, turgor normal. No rashes or lesions Neurologic: Grossly normal  EKG NSR  ASSESSMENT AND PLAN:   Dyspnea on exertion Primary complaint today  CAD (coronary artery disease) Coronary CTA  showed CAD, possible > 50% in LAD but non diagnostic study secondary to bolus timing.   Hyperlipidemia On statin Rx  Chronic diastolic heart failure (HCC) Grade 2 DD on echo Nov 2017  Family history of coronary artery disease in father Father had MI/ CABG in his 51's   PLAN  Discussed with Dr Percival Spanish (Dr Sallyanne Kuster on vacation). Plan Rt and Lt heart cath. The patient understands that risks included but are not limited to stroke (1 in 1000), death (1 in 37), kidney failure [usually temporary] (1 in 500), bleeding (1 in 200), allergic reaction [possibly serious] (1 in 200).  The patient understands and agrees to proceed.   Kerin Ransom PA-C 12/20/2017 9:48 AM

## 2017-12-20 NOTE — Assessment & Plan Note (Signed)
Grade 2 DD on echo Nov 2017

## 2017-12-20 NOTE — Progress Notes (Signed)
12/20/2017 LASHALA LASER   11-10-1966  [id number]  Primary Physician Shawnee Knapp, MD Primary Cardiologist: Dr Sallyanne Kuster  HPI:  52 y/o female with a family history of CAD- (her father had an MI in his 31's and is s/p CABG), and HLD followed by Dr Sallyanne Kuster with a history of DOE and past chest pain. Prior echo 10/19/16 showed an EF of 55-60% with grade 2 DD.  A stress echo Nov 2017 was negative for ischemia. Because of continued symptoms she had a coronary CTA 12/11/17. Unfortunately this was a non diagnostic study due to bolus timing. She did have CAD, possibly > 50% in her LAD. Her Ca++ score was 245-99% for her age. The pt 's main complaint to me is dyspnea with exertion. She can walk slowly but if she rushes she gets significantly SOB and has to stop.    Current Outpatient Medications  Medication Sig Dispense Refill  . atorvastatin (LIPITOR) 20 MG tablet Take 1 tablet (20 mg total) by mouth daily. 90 tablet 3  . carvedilol (COREG) 6.25 MG tablet Take 1 tablet (6.25 mg total) by mouth 2 (two) times daily. 180 tablet 3  . furosemide (LASIX) 20 MG tablet Take 1 tablet (20 mg total) by mouth daily. 90 tablet 3  . traZODone (DESYREL) 100 MG tablet Take 1 tablet (100 mg total) by mouth at bedtime as needed for sleep. 90 tablet 1   No current facility-administered medications for this visit.     No Known Allergies  Past Medical History:  Diagnosis Date  . Anxiety   . CAD (coronary artery disease)    Seen on CTA  . Hyperlipidemia   . Hypertension     Social History   Socioeconomic History  . Marital status: Married    Spouse name: Not on file  . Number of children: Not on file  . Years of education: Not on file  . Highest education level: Not on file  Social Needs  . Financial resource strain: Not on file  . Food insecurity - worry: Not on file  . Food insecurity - inability: Not on file  . Transportation needs - medical: Not on file  . Transportation needs - non-medical: Not on  file  Occupational History  . Not on file  Tobacco Use  . Smoking status: Never Smoker  . Smokeless tobacco: Never Used  Substance and Sexual Activity  . Alcohol use: Yes    Alcohol/week: 0.6 oz    Types: 1 Glasses of wine per week  . Drug use: No  . Sexual activity: Yes    Birth control/protection: Other-see comments    Comment: pill  Other Topics Concern  . Not on file  Social History Narrative  . Not on file     Family History  Problem Relation Age of Onset  . Coronary artery disease Father   . Hypertension Father   . Stroke Father   . Heart attack Father   . Heart disease Paternal Uncle   . Heart attack Paternal Uncle      Review of Systems: General: negative for chills, fever, night sweats or weight changes.  Cardiovascular: negative for chest pain, dyspnea on exertion, edema, orthopnea, palpitations, paroxysmal nocturnal dyspnea or shortness of breath Dermatological: negative for rash Respiratory: negative for cough or wheezing Urologic: negative for hematuria Abdominal: negative for nausea, vomiting, diarrhea, bright red blood per rectum, melena, or hematemesis Neurologic: negative for visual changes, syncope, or dizziness All other systems reviewed and  are otherwise negative except as noted above.    Blood pressure 138/72, pulse 63, height 5\' 3"  (1.6 m), weight 126 lb (57.2 kg), last menstrual period 04/17/2013.  General appearance: alert, cooperative and no distress Neck: no carotid bruit and no JVD Lungs: clear to auscultation bilaterally Heart: regular rate and rhythm Abdomen: soft, non-tender; bowel sounds normal; no masses,  no organomegaly Extremities: extremities normal, atraumatic, no cyanosis or edema Pulses: 2+ and symmetric Skin: Skin color, texture, turgor normal. No rashes or lesions Neurologic: Grossly normal  EKG NSR  ASSESSMENT AND PLAN:   Dyspnea on exertion Primary complaint today  CAD (coronary artery disease) Coronary CTA  showed CAD, possible > 50% in LAD but non diagnostic study secondary to bolus timing.   Hyperlipidemia On statin Rx  Chronic diastolic heart failure (HCC) Grade 2 DD on echo Nov 2017  Family history of coronary artery disease in father Father had MI/ CABG in his 95's   PLAN  Discussed with Dr Percival Spanish (Dr Sallyanne Kuster on vacation). Plan Rt and Lt heart cath. The patient understands that risks included but are not limited to stroke (1 in 1000), death (1 in 101), kidney failure [usually temporary] (1 in 500), bleeding (1 in 200), allergic reaction [possibly serious] (1 in 200).  The patient understands and agrees to proceed.   Kerin Ransom PA-C 12/20/2017 9:48 AM

## 2017-12-20 NOTE — Assessment & Plan Note (Signed)
Coronary CTA showed CAD, possible > 50% in LAD but non diagnostic study secondary to bolus timing.

## 2017-12-20 NOTE — Assessment & Plan Note (Signed)
On statin Rx 

## 2017-12-20 NOTE — Assessment & Plan Note (Signed)
Primary complaint today

## 2017-12-20 NOTE — Patient Instructions (Addendum)
Medication Instructions:   Your physician has recommended you make the following change in your medication:  Start aspirin (enteric coated) 81 mg by mouth daily.  Continue all other medications the same.  Labwork:  NONE  Testing/Procedures: Your physician has requested that you have a cardiac catheterization. Cardiac catheterization is used to diagnose and/or treat various heart conditions. Doctors may recommend this procedure for a number of different reasons. The most common reason is to evaluate chest pain. Chest pain can be a symptom of coronary artery disease (CAD), and cardiac catheterization can show whether plaque is narrowing or blocking your heart's arteries. This procedure is also used to evaluate the valves, as well as measure the blood flow and oxygen levels in different parts of your heart. For further information please visit [email or web address]. Please follow instructions below.  Follow-Up:  Your physician recommends that you schedule a follow-up appointment in: 1 month with Dr. Sallyanne Kuster.  Any Other Special Instructions Will Be Listed Below (If Applicable).   Nancy [name] [address] [place] [address] Dept: [phone] Loc: [phone]  Nancy [name]  12/20/2017  You are scheduled for a cardiac catheterization on Tuesday, December 24, 2017 @10 :30 am with Dr. Ellyn Hack.  1. Please arrive at the [facility] (Main Entrance A) at [facility]: [place], [place] [address] at 8:00 am (two and one- half hours before your procedure to ensure your preparation). Free valet parking service is available.   Special note: Every effort is made to have your procedure done on time. Please understand that emergencies sometimes delay scheduled procedures.  2. Diet: Nothing to eat or drink after midnight.  3. Labs: will be done before your heart cath.  4. Medication  instructions in preparation for your procedure: You may take all of your medications as usual the morning of your heart cath. Please hold your furosemide that morning.   On the morning of your procedure, take 4 of your 81 mg aspirin and any morning medicines NOT listed above.  You may use sips of water.  5. Plan for one night stay--bring personal belongings. 6. Bring a current list of your medications and current insurance cards. 7. You MUST have a responsible person to drive you home. 8. Someone MUST be with you the first 24 hours after you arrive home or your discharge will be delayed. 9. Please wear clothes that are easy to get on and off and wear slip-on shoes.  Thank you for allowing [place] to care for you!    -- [facility]    If you need a refill on your cardiac medications before your next appointment, please call your pharmacy.

## 2017-12-23 ENCOUNTER — Telehealth: Payer: Self-pay

## 2017-12-23 NOTE — Telephone Encounter (Signed)
Patient contacted pre-catheterization at [facility] scheduled for:  12/24/2017 @ 1030 Verified arrival time and place:  NT @ 0800 Confirmed AM meds to be taken pre-cath with sip of water: Take ASA Hold Lasix Confirmed patient has responsible person to drive home post procedure and observe patient for 24 hours:  yes Addl concerns:  none

## 2017-12-24 ENCOUNTER — Ambulatory Visit (HOSPITAL_COMMUNITY)
Admission: RE | Disposition: A | Discharge status: Home / Self Care | Payer: Self-pay | Source: Ambulatory Visit | Attending: Cardiology

## 2017-12-24 ENCOUNTER — Ambulatory Visit (HOSPITAL_COMMUNITY)
Admission: RE | Admit: 2017-12-24 | Discharge: 2017-12-26 | Disposition: A | Discharge status: Home / Self Care | Payer: 59 | Source: Ambulatory Visit | Attending: Cardiology | Admitting: Cardiology

## 2017-12-24 ENCOUNTER — Other Ambulatory Visit: Payer: Self-pay

## 2017-12-24 ENCOUNTER — Encounter (HOSPITAL_COMMUNITY): Payer: Self-pay | Admitting: Cardiology

## 2017-12-24 DIAGNOSIS — R0600 Dyspnea, unspecified: Secondary | ICD-10-CM | POA: Insufficient documentation

## 2017-12-24 DIAGNOSIS — E785 Hyperlipidemia, unspecified: Secondary | ICD-10-CM | POA: Insufficient documentation

## 2017-12-24 DIAGNOSIS — Z79899 Other long term (current) drug therapy: Secondary | ICD-10-CM | POA: Insufficient documentation

## 2017-12-24 DIAGNOSIS — Z8249 Family history of ischemic heart disease and other diseases of the circulatory system: Secondary | ICD-10-CM | POA: Diagnosis not present

## 2017-12-24 DIAGNOSIS — R0609 Other forms of dyspnea: Secondary | ICD-10-CM

## 2017-12-24 DIAGNOSIS — I110 Hypertensive heart disease with heart failure: Secondary | ICD-10-CM | POA: Insufficient documentation

## 2017-12-24 DIAGNOSIS — I10 Essential (primary) hypertension: Secondary | ICD-10-CM | POA: Diagnosis present

## 2017-12-24 DIAGNOSIS — Z955 Presence of coronary angioplasty implant and graft: Secondary | ICD-10-CM

## 2017-12-24 DIAGNOSIS — I25119 Atherosclerotic heart disease of native coronary artery with unspecified angina pectoris: Secondary | ICD-10-CM | POA: Insufficient documentation

## 2017-12-24 DIAGNOSIS — Z7902 Long term (current) use of antithrombotics/antiplatelets: Secondary | ICD-10-CM | POA: Insufficient documentation

## 2017-12-24 DIAGNOSIS — I5032 Chronic diastolic (congestive) heart failure: Secondary | ICD-10-CM | POA: Insufficient documentation

## 2017-12-24 DIAGNOSIS — F419 Anxiety disorder, unspecified: Secondary | ICD-10-CM | POA: Diagnosis not present

## 2017-12-24 DIAGNOSIS — I2542 Coronary artery dissection: Secondary | ICD-10-CM | POA: Diagnosis not present

## 2017-12-24 DIAGNOSIS — Z9861 Coronary angioplasty status: Secondary | ICD-10-CM

## 2017-12-24 DIAGNOSIS — Z7982 Long term (current) use of aspirin: Secondary | ICD-10-CM | POA: Insufficient documentation

## 2017-12-24 DIAGNOSIS — I209 Angina pectoris, unspecified: Secondary | ICD-10-CM | POA: Diagnosis present

## 2017-12-24 DIAGNOSIS — I2510 Atherosclerotic heart disease of native coronary artery without angina pectoris: Secondary | ICD-10-CM | POA: Diagnosis present

## 2017-12-24 HISTORY — PX: RIGHT/LEFT HEART CATH AND CORONARY ANGIOGRAPHY: CATH118266

## 2017-12-24 HISTORY — PX: CORONARY ANGIOPLASTY WITH STENT PLACEMENT: SHX49

## 2017-12-24 HISTORY — PX: CORONARY BALLOON ANGIOPLASTY: CATH118233

## 2017-12-24 HISTORY — PX: CORONARY STENT INTERVENTION: CATH118234

## 2017-12-24 LAB — CBC
HCT: 34.1 % — ABNORMAL LOW (ref 36.0–46.0)
Hemoglobin: 11.7 g/dL — ABNORMAL LOW (ref 12.0–15.0)
MCH: 28.6 pg (ref 26.0–34.0)
MCHC: 34.3 g/dL (ref 30.0–36.0)
MCV: 83.4 fL (ref 78.0–100.0)
Platelets: 255 10*3/uL (ref 150–400)
RBC: 4.09 MIL/uL (ref 3.87–5.11)
RDW: 12.9 % (ref 11.5–15.5)
WBC: 7.6 10*3/uL (ref 4.0–10.5)

## 2017-12-24 LAB — BASIC METABOLIC PANEL
Anion gap: 7 (ref 5–15)
BUN: 11 mg/dL (ref 6–20)
CO2: 25 mmol/L (ref 22–32)
Calcium: 9.3 mg/dL (ref 8.9–10.3)
Chloride: 107 mmol/L (ref 101–111)
Creatinine, Ser: 0.83 mg/dL (ref 0.44–1.00)
GFR calc Af Amer: >60 mL/min (ref >60)
GFR calc non Af Amer: >60 mL/min (ref >60)
Glucose, Bld: 90 mg/dL (ref 65–99)
Potassium: 4 mmol/L (ref 3.5–5.1)
Sodium: 139 mmol/L (ref 135–145)

## 2017-12-24 LAB — POCT ACTIVATED CLOTTING TIME
ACTIVATED CLOTTING TIME: 257 s
Activated Clotting Time: 263 seconds
Activated Clotting Time: 285 seconds
Activated Clotting Time: 219 seconds

## 2017-12-24 LAB — POCT I-STAT 3, VENOUS BLOOD GAS (G3P V)
ACID-BASE DEFICIT: 3 mmol/L — AB (ref 0.0–2.0)
ACID-BASE DEFICIT: 3 mmol/L — AB (ref 0.0–2.0)
BICARBONATE: 22.7 mmol/L (ref 20.0–28.0)
BICARBONATE: 22.4 mmol/L (ref 20.0–28.0)
O2 SAT: 76 %
O2 SAT: 77 %
TCO2: 24 mmol/L (ref 22–32)
TCO2: 24 mmol/L (ref 22–32)
pCO2, Ven: 44.1 mmHg (ref 44.0–60.0)
pCO2, Ven: 42.6 mmHg — ABNORMAL LOW (ref 44.0–60.0)
pH, Ven: 7.32 (ref 7.250–7.430)
pH, Ven: 7.329 (ref 7.250–7.430)
pO2, Ven: 44 mmHg (ref 32.0–45.0)
pO2, Ven: 45 mmHg (ref 32.0–45.0)

## 2017-12-24 LAB — POCT I-STAT 3, ART BLOOD GAS (G3+)
Acid-base deficit: 3 mmol/L — ABNORMAL HIGH (ref 0.0–2.0)
BICARBONATE: 23 mmol/L (ref 20.0–28.0)
O2 SAT: 99 %
PCO2 ART: 44.1 mmHg (ref 32.0–48.0)
TCO2: 24 mmol/L (ref 22–32)
pH, Arterial: 7.325 — ABNORMAL LOW (ref 7.350–7.450)
pO2, Arterial: 154 mmHg — ABNORMAL HIGH (ref 83.0–108.0)

## 2017-12-24 LAB — PROTIME-INR
INR: 1.06
Prothrombin Time: 13.7 seconds (ref 11.4–15.2)

## 2017-12-24 SURGERY — RIGHT/LEFT HEART CATH AND CORONARY ANGIOGRAPHY
Anesthesia: LOCAL

## 2017-12-24 MED ORDER — ATORVASTATIN CALCIUM 40 MG PO TABS
40.0000 mg | ORAL_TABLET | Freq: Every day | ORAL | Status: DC
  Administered 2017-12-25: 40 mg via ORAL
  Filled 2017-12-24: qty 1

## 2017-12-24 MED ORDER — MIDAZOLAM HCL 2 MG/2ML IJ SOLN
INTRAMUSCULAR | Status: AC
  Filled 2017-12-24: qty 2

## 2017-12-24 MED ORDER — IOPAMIDOL (ISOVUE-370) INJECTION 76%
INTRAVENOUS | Status: AC
  Filled 2017-12-24: qty 125

## 2017-12-24 MED ORDER — HEPARIN SODIUM (PORCINE) 1000 UNIT/ML IJ SOLN
INTRAMUSCULAR | Status: DC | PRN
  Administered 2017-12-24 (×3): 3000 [IU] via INTRAVENOUS

## 2017-12-24 MED ORDER — SODIUM CHLORIDE 0.9 % IV SOLN: 250.0000 mL | INTRAVENOUS | Status: DC | PRN

## 2017-12-24 MED ORDER — IOPAMIDOL (ISOVUE-370) INJECTION 76%
INTRAVENOUS | Status: AC
  Filled 2017-12-24: qty 50

## 2017-12-24 MED ORDER — SODIUM CHLORIDE 0.9 % IV SOLN
INTRAVENOUS | Status: AC
  Administered 2017-12-24: 19:00:00 via INTRAVENOUS

## 2017-12-24 MED ORDER — NITROGLYCERIN 1 MG/10 ML FOR IR/CATH LAB
INTRA_ARTERIAL | Status: DC | PRN
  Administered 2017-12-24 (×5): 200 ug via INTRACORONARY

## 2017-12-24 MED ORDER — SODIUM CHLORIDE 0.9 % WEIGHT BASED INFUSION
3.0000 mL/kg/h | INTRAVENOUS | Status: DC
  Administered 2017-12-24: 3 mL/kg/h via INTRAVENOUS

## 2017-12-24 MED ORDER — IOPAMIDOL (ISOVUE-370) INJECTION 76%
INTRAVENOUS | Status: DC | PRN
  Administered 2017-12-24: 220 mL via INTRA_ARTERIAL

## 2017-12-24 MED ORDER — LABETALOL HCL 5 MG/ML IV SOLN
10.0000 mg | INTRAVENOUS | Status: AC | PRN
  Administered 2017-12-24: 15:00:00 10 mg via INTRAVENOUS
  Filled 2017-12-24: qty 4

## 2017-12-24 MED ORDER — TRAZODONE HCL 100 MG PO TABS
100.0000 mg | ORAL_TABLET | Freq: Every evening | ORAL | Status: DC | PRN
  Filled 2017-12-24: qty 1

## 2017-12-24 MED ORDER — VERAPAMIL HCL 2.5 MG/ML IV SOLN
INTRAVENOUS | Status: DC | PRN
  Administered 2017-12-24: 10 mL via INTRA_ARTERIAL

## 2017-12-24 MED ORDER — FENTANYL CITRATE (PF) 100 MCG/2ML IJ SOLN
INTRAMUSCULAR | Status: DC | PRN
  Administered 2017-12-24 (×3): 25 ug via INTRAVENOUS

## 2017-12-24 MED ORDER — HEPARIN (PORCINE) IN NACL 2-0.9 UNIT/ML-% IJ SOLN
INTRAMUSCULAR | Status: AC | PRN
  Administered 2017-12-24: 1000 mL

## 2017-12-24 MED ORDER — ACETAMINOPHEN 325 MG PO TABS: 650.0000 mg | ORAL_TABLET | ORAL | Status: DC | PRN

## 2017-12-24 MED ORDER — CLOPIDOGREL BISULFATE 300 MG PO TABS
ORAL_TABLET | ORAL | Status: DC | PRN
  Administered 2017-12-24: 600 mg via ORAL

## 2017-12-24 MED ORDER — LIDOCAINE HCL (PF) 1 % IJ SOLN
INTRAMUSCULAR | Status: DC | PRN
  Administered 2017-12-24 (×2): 2 mL via INTRADERMAL

## 2017-12-24 MED ORDER — MIDAZOLAM HCL 2 MG/2ML IJ SOLN
INTRAMUSCULAR | Status: DC | PRN
  Administered 2017-12-24 (×3): 1 mg via INTRAVENOUS
  Administered 2017-12-24: 2 mg via INTRAVENOUS

## 2017-12-24 MED ORDER — NITROGLYCERIN 1 MG/10 ML FOR IR/CATH LAB
INTRA_ARTERIAL | Status: AC
  Filled 2017-12-24: qty 10

## 2017-12-24 MED ORDER — CARVEDILOL 6.25 MG PO TABS
6.2500 mg | ORAL_TABLET | Freq: Two times a day (BID) | ORAL | Status: DC
  Administered 2017-12-24 – 2017-12-26 (×4): 6.25 mg via ORAL
  Filled 2017-12-24: qty 2
  Filled 2017-12-24: qty 1
  Filled 2017-12-24: qty 2
  Filled 2017-12-24 (×2): qty 1
  Filled 2017-12-24: qty 2
  Filled 2017-12-24: qty 1
  Filled 2017-12-24: qty 2

## 2017-12-24 MED ORDER — HEPARIN (PORCINE) IN NACL 2-0.9 UNIT/ML-% IJ SOLN
INTRAMUSCULAR | Status: AC
  Filled 2017-12-24: qty 1000

## 2017-12-24 MED ORDER — ASPIRIN EC 81 MG PO TBEC
81.0000 mg | DELAYED_RELEASE_TABLET | Freq: Every day | ORAL | Status: DC
  Administered 2017-12-25 – 2017-12-26 (×2): 81 mg via ORAL
  Filled 2017-12-24 (×2): qty 1

## 2017-12-24 MED ORDER — VERAPAMIL HCL 2.5 MG/ML IV SOLN
INTRAVENOUS | Status: AC
  Filled 2017-12-24: qty 2

## 2017-12-24 MED ORDER — SODIUM CHLORIDE 0.9% FLUSH
3.0000 mL | Freq: Two times a day (BID) | INTRAVENOUS | Status: DC
  Administered 2017-12-24 – 2017-12-25 (×2): 3 mL via INTRAVENOUS

## 2017-12-24 MED ORDER — CLOPIDOGREL BISULFATE 300 MG PO TABS
ORAL_TABLET | ORAL | Status: AC
  Filled 2017-12-24: qty 2

## 2017-12-24 MED ORDER — HYDRALAZINE HCL 20 MG/ML IJ SOLN: 5.0000 mg | INTRAMUSCULAR | Status: AC | PRN

## 2017-12-24 MED ORDER — ONDANSETRON HCL 4 MG/2ML IJ SOLN: 4.0000 mg | Freq: Four times a day (QID) | INTRAMUSCULAR | Status: DC | PRN

## 2017-12-24 MED ORDER — CLOPIDOGREL BISULFATE 75 MG PO TABS
75.0000 mg | ORAL_TABLET | Freq: Every day | ORAL | Status: DC
  Administered 2017-12-25 – 2017-12-26 (×2): 75 mg via ORAL
  Filled 2017-12-24 (×3): qty 1

## 2017-12-24 MED ORDER — SODIUM CHLORIDE 0.9% FLUSH: 3.0000 mL | INTRAVENOUS | Status: DC | PRN

## 2017-12-24 MED ORDER — ANGIOPLASTY BOOK
Freq: Once | Status: AC
  Administered 2017-12-24: 1
  Filled 2017-12-24: qty 1

## 2017-12-24 MED ORDER — FENTANYL CITRATE (PF) 100 MCG/2ML IJ SOLN
INTRAMUSCULAR | Status: AC
  Filled 2017-12-24: qty 2

## 2017-12-24 MED ORDER — HEPARIN SODIUM (PORCINE) 1000 UNIT/ML IJ SOLN
INTRAMUSCULAR | Status: AC
  Filled 2017-12-24: qty 1

## 2017-12-24 MED ORDER — SODIUM CHLORIDE 0.9 % WEIGHT BASED INFUSION: 1.0000 mL/kg/h | INTRAVENOUS | Status: DC

## 2017-12-24 MED ORDER — LIDOCAINE HCL 1 % IJ SOLN
INTRAMUSCULAR | Status: AC
  Filled 2017-12-24: qty 20

## 2017-12-24 SURGICAL SUPPLY — 32 items
BALLN SAPPHIRE 2.0X15 (BALLOONS) ×2
BALLN ~~LOC~~ EMERGE MR 2.5X8 (BALLOONS) ×2
BALLN ~~LOC~~ EMERGE MR 2.75X12 (BALLOONS) ×2
BALLN ~~LOC~~ EUPHORA RX 2.75X15 (BALLOONS) ×2
BALLOON SAPPHIRE 2.0X15 (BALLOONS) IMPLANT
BALLOON ~~LOC~~ EMERGE MR 2.5X8 (BALLOONS) IMPLANT
BALLOON ~~LOC~~ EMERGE MR 2.75X12 (BALLOONS) IMPLANT
BALLOON ~~LOC~~ EUPHORA RX 2.75X15 (BALLOONS) IMPLANT
CATH BALLN WEDGE 5F 110CM (CATHETERS) ×2 IMPLANT
CATH LAUNCHER 5F EBU3.5 (CATHETERS) ×1 IMPLANT
CATH OPTITORQUE TIG 4.0 5F (CATHETERS) ×1 IMPLANT
COVER PRB 48X5XTLSCP FOLD TPE (BAG) IMPLANT
COVER PROBE 5X48 (BAG) ×2
DEVICE RAD COMP TR BAND LRG (VASCULAR PRODUCTS) ×1 IMPLANT
GLIDESHEATH SLEND A-KIT 6F 22G (SHEATH) ×1 IMPLANT
GUIDEWIRE INQWIRE 1.5J.035X260 (WIRE) IMPLANT
INQWIRE 1.5J .035X260CM (WIRE) ×2
KIT ENCORE 26 ADVANTAGE (KITS) ×1 IMPLANT
KIT HEART LEFT (KITS) ×2 IMPLANT
PACK CARDIAC CATHETERIZATION (CUSTOM PROCEDURE TRAY) ×2 IMPLANT
SHEATH GLIDE SLENDER 4/5FR (SHEATH) ×1 IMPLANT
STENT SYNERGY DES 2.25X12 (Permanent Stent) ×1 IMPLANT
STENT SYNERGY DES 2.25X16 (Permanent Stent) ×1 IMPLANT
STENT SYNERGY DES 2.5X28 (Permanent Stent) ×1 IMPLANT
TRANSDUCER W/STOPCOCK (MISCELLANEOUS) ×2 IMPLANT
TUBING CIL FLEX 10 FLL-RA (TUBING) ×2 IMPLANT
VALVE GUARDIAN II ~~LOC~~ HEMO (MISCELLANEOUS) ×1 IMPLANT
WIRE ASAHI PROWATER 180CM (WIRE) ×1 IMPLANT
WIRE FIGHTER CROSSING 190CM (WIRE) ×1 IMPLANT
WIRE LUGE 182CM (WIRE) ×1 IMPLANT
WIRE MAILMAN 182CM (WIRE) ×1 IMPLANT
WIRE MARVEL STR TIP 190CM (WIRE) ×1 IMPLANT

## 2017-12-24 NOTE — Progress Notes (Signed)
Site area: right brachial  Site Prior to Removal:  Level 0  Pressure Applied For 10 MINUTES    Minutes Beginning at 1620  Manual:   Yes.    Patient Status During Pull:  AAO X4  Post Pull brachial Site:  Level 0  Post Pull Instructions Given:  Yes.    Post Pull Pulses Present:  Yes.    Dressing Applied:  Yes.    Comments:  Pt.tolerated  Procedure well

## 2017-12-24 NOTE — Interval H&P Note (Signed)
History and Physical Interval Note:  12/24/2017 9:47 AM  Nancy [name]  has presented today for surgery, with the diagnosis of dyspnea and chest pain upon excertion, cad on CT Scan.  The various methods of treatment have been discussed with the patient and family. After consideration of risks, benefits and other options for treatment, the patient has consented to  Procedure(s): RIGHT/LEFT HEART CATH AND CORONARY ANGIOGRAPHY (N/A) with possible PERCUTANEOUS CORONARY INTERVENTION as a surgical intervention .  The patient's history has been reviewed, patient examined, no change in status, stable for surgery.  I have reviewed the patient's chart and labs.  Questions were answered to the patient's satisfaction.     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  Non-ACS:    Anginal Classification: CCS III  Anti-ischemic medical therapy: Minimal Therapy (1 class of medications)  Non-Invasive Test Results: Intermediate-risk stress test findings: cardiac mortality 1-3%/year  / Equivocal Results  Prior CABG: No previous CABG   Glenetta Hew

## 2017-12-24 NOTE — Progress Notes (Signed)
TR BAND REMOVAL  LOCATION:    right radial  DEFLATED PER PROTOCOL:    Yes.    TIME BAND OFF / DRESSING APPLIED:    1700   SITE UPON ARRIVAL:    Level 0  SITE AFTER BAND REMOVAL:    Level 0  CIRCULATION SENSATION AND MOVEMENT:    Within Normal Limits   Yes.    COMMENTS:   Pt.tolerated well

## 2017-12-25 DIAGNOSIS — Z8249 Family history of ischemic heart disease and other diseases of the circulatory system: Secondary | ICD-10-CM

## 2017-12-25 DIAGNOSIS — I5032 Chronic diastolic (congestive) heart failure: Secondary | ICD-10-CM

## 2017-12-25 DIAGNOSIS — E785 Hyperlipidemia, unspecified: Secondary | ICD-10-CM | POA: Diagnosis not present

## 2017-12-25 DIAGNOSIS — I25119 Atherosclerotic heart disease of native coronary artery with unspecified angina pectoris: Secondary | ICD-10-CM | POA: Diagnosis not present

## 2017-12-25 DIAGNOSIS — R0600 Dyspnea, unspecified: Secondary | ICD-10-CM | POA: Diagnosis not present

## 2017-12-25 DIAGNOSIS — R0609 Other forms of dyspnea: Secondary | ICD-10-CM

## 2017-12-25 DIAGNOSIS — Z955 Presence of coronary angioplasty implant and graft: Secondary | ICD-10-CM | POA: Diagnosis not present

## 2017-12-25 DIAGNOSIS — I209 Angina pectoris, unspecified: Secondary | ICD-10-CM

## 2017-12-25 LAB — BASIC METABOLIC PANEL
Anion gap: 13 (ref 5–15)
BUN: 7 mg/dL (ref 6–20)
CHLORIDE: 108 mmol/L (ref 101–111)
CO2: 17 mmol/L — ABNORMAL LOW (ref 22–32)
CREATININE: 0.78 mg/dL (ref 0.44–1.00)
Calcium: 9.2 mg/dL (ref 8.9–10.3)
GFR calc Af Amer: >60 mL/min (ref >60)
GFR calc non Af Amer: >60 mL/min (ref >60)
Glucose, Bld: 99 mg/dL (ref 65–99)
Potassium: 4.2 mmol/L (ref 3.5–5.1)
SODIUM: 138 mmol/L (ref 135–145)

## 2017-12-25 LAB — CBC
HCT: 33.5 % — ABNORMAL LOW (ref 36.0–46.0)
Hemoglobin: 11.6 g/dL — ABNORMAL LOW (ref 12.0–15.0)
MCH: 28.6 pg (ref 26.0–34.0)
MCHC: 34.6 g/dL (ref 30.0–36.0)
MCV: 82.5 fL (ref 78.0–100.0)
PLATELETS: 234 10*3/uL (ref 150–400)
RBC: 4.06 MIL/uL (ref 3.87–5.11)
RDW: 12.8 % (ref 11.5–15.5)
WBC: 11.3 10*3/uL — AB (ref 4.0–10.5)

## 2017-12-25 MED ORDER — ATORVASTATIN CALCIUM 80 MG PO TABS
80.0000 mg | ORAL_TABLET | Freq: Every day | ORAL | Status: DC
  Administered 2017-12-26: 80 mg via ORAL
  Filled 2017-12-25: qty 1

## 2017-12-25 MED ORDER — LISINOPRIL 5 MG PO TABS
5.0000 mg | ORAL_TABLET | Freq: Every day | ORAL | Status: DC
  Administered 2017-12-25: 5 mg via ORAL
  Filled 2017-12-25: qty 1

## 2017-12-25 MED FILL — Lidocaine HCl Local Inj 1%: INTRAMUSCULAR | Qty: 20 | Status: AC

## 2017-12-25 NOTE — Progress Notes (Signed)
CARDIAC REHAB PHASE I   PRE:  Rate/Rhythm: 82 SR    BP: sitting 158/84    SaO2:   MODE:  Ambulation: 1000 ft   POST:  Rate/Rhythm: 95 SR    BP: sitting 152/98     SaO2:   Tolerated well, denied DOE. BP elevated, which she sts it does when she is in MD office or hospital. I encouraged her to get a BP cuff and keep record at home to report to Dr. Sallyanne Kuster. Ed completed, she understands importance of Plavix/ASA. Will refer to [place]. She does not watch her diet so plans to start. Linda, ACSM 12/25/2017 8:42 AM

## 2017-12-25 NOTE — Discharge Summary (Signed)
The patient has been seen in conjunction with Reino Bellis, NP-C. All aspects of care have been considered and discussed. The patient has been personally interviewed, examined, and all clinical data has been reviewed.   Stable over night. No chest pain.  F/U with primary cardiologist.  Able to return to work after [facility] visit.  Discharge Summary    Patient ID: Nancy [name],  MRN: [id number], DOB/AGE: [birth year] 52 y.o.  Admit date: 12/24/2017 Discharge date: 12/26/2017  Primary Care Provider: Shawnee Knapp Primary Cardiologist: Dr. Sallyanne Kuster   Discharge Diagnoses    Principal Problem:   Angina, class III ([place]) Active Problems:   Hyperlipidemia   Dyspnea on exertion   Benign essential HTN   Chronic diastolic heart failure ([place])   Coronary artery disease involving native coronary artery of native heart with angina pectoris (HCC) - CAD noted on Coronary CTA (unfortunately non-diagnostic to to procedural error)   Family history of coronary artery disease in father   Status post coronary artery stent placement   Allergies No Known Allergies  Diagnostic Studies/Procedures    Cath: 12/24/17  Conclusion     Prox LAD lesion is 95% stenosed.  A drug-eluting stent was successfully placed using a STENT SYNERGY DES 2.5X28.  Post intervention, there is a 0% residual stenosis.  _____________  Colon Flattery 1st Mrg lesion is 80% stenosed. Ost 1st Mrg to 1st Mrg lesion is 85% stenosed.  2 Overlapping drug-eluting stents were successfully placed using a STENT SYNERGY DES 2.25X16 with a STENT SYNERGY DES 2.25X12 proximally  Post intervention, there is a 0% residual stenosis.  1st Mrg lesion is 90% stenosed. Distal wire dissection  Balloon angioplasty was performed using a BALLOON SAPPHIRE 2.0X15. Post intervention, there is a 0% residual stenosis.  _____________  [name] lesion is 95% stenosed. Too small for PCI  Ost 1st Diag lesion is 50% stenosed.  The left ventricular  systolic function is normal. The left ventricular ejection fraction is 55-65% by visual estimate.  Normal RHC Pressures.   Severe 2 vessel PCI - pLAD 99% & ost-p OM1. -- PCI LAD 1 stent, OM1 2 overlapping DES with PTCA distal to stent 2/2 wire dissection. Essentially normal right heart cath pressures with LVEDP and wedge pressure of 11-15 mmHg.  Plan:  She will be monitored overnight for post PCI complications anticipate discharge tomorrow if stable.   DAPT with aspirin and Plavix for minimum 1 year.  Restart home medications, but have increase Crestor to 40 mg daily.  Did not restart Lasix, could potentially give tomorrow versus the day after discharge.  Glenetta Hew, M.D., M.S.   _____________   History of Present Illness     52 y/o female with a family history of CAD- (her father had an MI in his 71's and is s/p CABG), and HLD followed by Dr Sallyanne Kuster with a history of DOE and past chest pain. Prior echo 10/19/16 showed an EF of 55-60% with grade 2 DD.  A stress echo Nov 2017 was negative for ischemia. Because of continued symptoms she had a coronary CTA 12/11/17. Unfortunately this was a non diagnostic study due to bolus timing. She did have CAD, possibly > 50% in her LAD. Her Ca++ score was 245-99% for her age. The pt 's main complaint was dyspnea with exertion. She reported being able to walk slowly but if she rushes she gets significantly SOB and has to stop. Given her symptoms she was set up for outpatient cath.  Hospital Course     Underwent cardiac cath noted above with severe 2v disease with PCI to the LAD x1, and OM1 x2 with PTCA to the distal to the stent because of wire dissection. Noted to have normal Right Heart pressures. Plan for DAPT with ASA/plavix for at least one year. Her lipitor was increased to 80mg  daily. Post cath labs were stable. No further chest pain. Blood pressures were noted to be elevated and lisinopril 2.5mg  daily was added with improvement. Worked with  cardiac rehab without complaints.   General: Well developed, well nourished, female appearing in no acute distress. Head: Normocephalic, atraumatic.  Neck: Supple without bruits, JVD. Lungs:  Resp regular and unlabored, CTA. Heart: RRR, S1, S2, no S3, S4, soft systolic murmur; no rub. Abdomen: Soft, non-tender, non-distended with normoactive bowel sounds. No hepatomegaly. No rebound/guarding. No obvious abdominal masses. Extremities: No clubbing, cyanosis, edema. Distal pedal pulses are 2+ bilaterally. R radial cath site stable with mild bruising but no hematoma.  Neuro: Alert and oriented X 3. Moves all extremities spontaneously. Psych: Normal affect.  Nancy [name] was seen by Dr. Tamala Julian and determined stable for discharge home. Follow up in the office has been arranged. Medications are listed below.   _____________  Discharge Vitals Blood pressure 99/65, pulse 68, temperature 98.1 F (36.7 C), temperature source Oral, resp. rate 15, height 5\' 3"  (1.6 m), weight 130 lb 15.3 oz (59.4 kg), last menstrual period 04/17/2013, SpO2 99 %.  Filed Weights   12/24/17 0811 12/25/17 0602 12/26/17 0500  Weight: 126 lb (57.2 kg) 131 lb 1.6 oz (59.5 kg) 130 lb 15.3 oz (59.4 kg)    Labs & Radiologic Studies    CBC Recent Labs    12/24/17 0821 12/25/17 0341  WBC 7.6 11.3*  HGB 11.7* 11.6*  HCT 34.1* 33.5*  MCV 83.4 82.5  PLT 255 671   Basic Metabolic Panel Recent Labs    12/24/17 0821 12/25/17 0341  NA 139 138  K 4.0 4.2  CL 107 108  CO2 25 17*  GLUCOSE 90 99  BUN 11 7  CREATININE 0.83 0.78  CALCIUM 9.3 9.2   Liver Function Tests No results for input(s): AST, ALT, ALKPHOS, BILITOT, PROT, ALBUMIN in the last 72 hours. No results for input(s): LIPASE, AMYLASE in the last 72 hours. Cardiac Enzymes No results for input(s): CKTOTAL, CKMB, CKMBINDEX, TROPONINI in the last 72 hours. BNP Invalid input(s): POCBNP D-Dimer No results for input(s): DDIMER in the last 72  hours. Hemoglobin A1C No results for input(s): HGBA1C in the last 72 hours. Fasting Lipid Panel No results for input(s): CHOL, HDL, LDLCALC, TRIG, CHOLHDL, LDLDIRECT in the last 72 hours. Thyroid Function Tests No results for input(s): TSH, T4TOTAL, T3FREE, THYROIDAB in the last 72 hours.  Invalid input(s): FREET3 _____________  Ct Coronary Morph W/cta Cor W/score W/ca W/cm &/or Wo/cm  Addendum Date: 12/11/2017   ADDENDUM REPORT: 12/11/2017 12:22 CLINICAL DATA:  Chest pain EXAM: Cardiac CTA MEDICATIONS: Sub lingual nitro. 4mg  and lopressor 10mg  TECHNIQUE: The patient was scanned on a Siemens 245 slice scanner. Gantry rotation speed was 250 msecs. Collimation was .86mm. A 120 kV prospective scan was triggered in the descending thoracic aorta at 140 HU's with full mA 35-75% of the R-R interval. Average HR during the scan was 63 bpm. The 3D data set was interpreted on a dedicated work station using MPR, MIP and VRT modes. A total of 80cc of contrast was used. Poor quality study with mis registration and  timing bolus off more opacification of the RV and right sided chambers than left FINDINGS: Non-cardiac: See separate report from [facility]. No significant findings on limited lung and soft tissue windows. Calcium Score: Calcium noted in proximal RCA/circumflex and LAD as well as mid LAD and distal LM Aorta: Normal 3.1 cm Coronary Arteries: Right dominant with no anomalies LM: Less than 30% calcified stenosis LAD: Cannot r/o >50% stenosis in the proximal LAD at D1 take off D1: Poorly visualized D2: Poorly visualized Circumflex: Cannot r/o >50% mixed plaque stenosis in the proximal circumflex at OM1 take off Distal circumflex not well seen RCA: Dominant less than 30% calcified stenosis in the proximal vessel PDA: Normal PLA: Normal IMPRESSION: 1.  Normal aorta 3.1 cm 2.  Calcium score 245 which is 36 th percentile for age and sex 75. Non diagnostic study due to poor timing bolus (right side heart more  opacified than left) and misregistration. Study not suitable for FFR CT Suggest f/u cath or perfusion study Cannot r/o significant stenosis in proximal LAD and circumflex [place] Electronically Signed   By: Jenkins Rouge M.D.   On: 12/11/2017 12:22   Result Date: 12/11/2017 EXAM: OVER-READ INTERPRETATION  CT CHEST The following report is an over-read performed by radiologist Dr. Rolm Baptise of [facility] on 12/11/2017. This over-read does not include interpretation of cardiac or coronary anatomy or pathology. The coronary CTA interpretation by the cardiologist is attached. COMPARISON:  None. FINDINGS: Vascular: Heart is normal size.  Visualized aorta is normal caliber. Mediastinum/Nodes: No adenopathy in the lower mediastinum or hila. Lungs/Pleura: Visualized lungs clear.  No effusions. Upper Abdomen: Imaging into the upper abdomen shows no acute findings. 3.3 cm cyst in the left hepatic lobe appears benign. Musculoskeletal: Chest wall soft tissues are unremarkable. No acute bony abnormality. IMPRESSION: No acute or significant extracardiac abnormality. Electronically Signed: By: Rolm Baptise M.D. On: 12/11/2017 09:29   Disposition   Pt is being discharged home today in good condition.  Follow-up Plans & Appointments    Follow-up Information    Erlene Quan, PA-C Follow up on 01/08/2018.   Specialties:  Cardiology, Radiology Why:  at 8:30am for your follow up appt. Contact information: [address] [place] [address] [phone]          Discharge Instructions    Amb Referral to Cardiac Rehabilitation   Complete by:  As directed    Diagnosis:   Coronary Stents PTCA     Call MD for:  redness, tenderness, or signs of infection (pain, swelling, redness, odor or green/yellow discharge around incision site)   Complete by:  As directed    Diet - low sodium heart healthy   Complete by:  As directed    Discharge instructions   Complete by:  As directed     Radial Site Care Refer to this sheet in the next few weeks. These instructions provide you with information on caring for yourself after your procedure. Your caregiver may also give you more specific instructions. Your treatment has been planned according to current medical practices, but problems sometimes occur. Call your caregiver if you have any problems or questions after your procedure. HOME CARE INSTRUCTIONS You may shower the day after the procedure.Remove the bandage (dressing) and gently wash the site with plain soap and water.Gently pat the site dry.  Do not apply powder or lotion to the site.  Do not submerge the affected site in water for 3 to 5 days.  Inspect the site at least twice daily.  Do not flex or bend the affected arm for 24 hours.  No lifting over 5 pounds (2.3 kg) for 5 days after your procedure.  Do not drive home if you are discharged the same day of the procedure. Have someone else drive you.  You may drive 24 hours after the procedure unless otherwise instructed by your caregiver.  What to expect: Any bruising will usually fade within 1 to 2 weeks.  Blood that collects in the tissue (hematoma) may be painful to the touch. It should usually decrease in size and tenderness within 1 to 2 weeks.  SEEK IMMEDIATE MEDICAL CARE IF: You have unusual pain at the radial site.  You have redness, warmth, swelling, or pain at the radial site.  You have drainage (other than a small amount of blood on the dressing).  You have chills.  You have a fever or persistent symptoms for more than 72 hours.  You have a fever and your symptoms suddenly get worse.  Your arm becomes pale, cool, tingly, or numb.  You have heavy bleeding from the site. Hold pressure on the site.   PLEASE DO NOT MISS ANY DOSES OF YOUR PLAVIX!!!!! Also keep a log of you blood pressures and bring back to your follow up appt. Please call the office with any questions.   Patients taking blood thinners should  generally stay away from medicines like ibuprofen, Advil, Motrin, naproxen, and Aleve due to risk of stomach bleeding. You may take Tylenol as directed or talk to your primary doctor about alternatives.   Increase activity slowly   Complete by:  As directed       Discharge Medications     Medication List    STOP taking these medications   furosemide 20 MG tablet Commonly known as:  LASIX   ibuprofen 200 MG tablet Commonly known as:  ADVIL,MOTRIN   naproxen sodium 220 MG tablet Commonly known as:  ALEVE     TAKE these medications   aspirin EC 81 MG tablet Take 1 tablet (81 mg total) by mouth daily.   atorvastatin 80 MG tablet Commonly known as:  LIPITOR Take 1 tablet (80 mg total) by mouth daily. What changed:    medication strength  how much to take   carvedilol 6.25 MG tablet Commonly known as:  COREG Take 1 tablet (6.25 mg total) by mouth 2 (two) times daily.   clopidogrel 75 MG tablet Commonly known as:  PLAVIX Take 1 tablet (75 mg total) by mouth daily with breakfast.   lisinopril 2.5 MG tablet Commonly known as:  PRINIVIL,ZESTRIL Take 1 tablet (2.5 mg total) by mouth daily.   nitroGLYCERIN 0.4 MG SL tablet Commonly known as:  NITROSTAT Place 1 tablet (0.4 mg total) under the tongue every 5 (five) minutes as needed.   traZODone 100 MG tablet Commonly known as:  DESYREL Take 1 tablet (100 mg total) by mouth at bedtime as needed for sleep.        Aspirin prescribed at discharge?  Yes High Intensity Statin Prescribed? (Lipitor 40-80mg  or Crestor 20-40mg ): Yes Beta Blocker Prescribed? Yes For EF <40%, was ACEI/ARB Prescribed? Yes ADP Receptor Inhibitor Prescribed? (i.e. Plavix etc.-Includes Medically Managed Patients): Yes For EF <40%, Aldosterone Inhibitor Prescribed? No: EF ok Was EF assessed during THIS hospitalization? Yes Was Cardiac Rehab II ordered? (Included Medically managed Patients): Yes   Outstanding Labs/Studies   FLP/LFTs in 6 weeks  if tolerating statin increase.  Duration of Discharge Encounter   Greater than 30 minutes including physician time.  Signed, Reino Bellis NP-C 12/26/2017, 10:24 AM

## 2017-12-25 NOTE — Progress Notes (Signed)
Progress Note  Patient Name: Nancy [name] Date of Encounter: 12/25/2017  Primary Cardiologist: Sanda Klein, MD   Subjective   Ambulated.  Had some mild dyspnea.  Inpatient Medications    Scheduled Meds: . aspirin EC  81 mg Oral Daily  . atorvastatin  40 mg Oral Daily  . carvedilol  6.25 mg Oral BID  . clopidogrel  75 mg Oral Q breakfast  . sodium chloride flush  3 mL Intravenous Q12H   Continuous Infusions: . sodium chloride     PRN Meds: sodium chloride, acetaminophen, ondansetron (ZOFRAN) IV, sodium chloride flush, traZODone   Vital Signs    Vitals:   12/25/17 0425 12/25/17 0456 12/25/17 0602 12/25/17 0740  BP:  (!) 154/82  (!) 158/84  Pulse:  77  76  Resp:  13  11  Temp: 97.7 F (36.5 C)   98.9 F (37.2 C)  TempSrc: Oral   Oral  SpO2:  100%  99%  Weight:   131 lb 1.6 oz (59.5 kg)   Height:        Intake/Output Summary (Last 24 hours) at 12/25/2017 0948 Last data filed at 12/25/2017 0740 Gross per 24 hour  Intake 1460 ml  Output 1300 ml  Net 160 ml   Filed Weights   12/24/17 0811 12/25/17 0602  Weight: 126 lb (57.2 kg) 131 lb 1.6 oz (59.5 kg)    Telemetry    Normal sinus rhythm- Personally Reviewed  ECG    Normal with normal sinus rhythm- Personally Reviewed  Physical Exam  Young appearing GEN: No acute distress.   Neck: No JVD Cardiac: RRR, no murmurs, rubs, or gallops.  Mild swelling above the radial cath site.  No obvious hematoma Respiratory: Clear to auscultation bilaterally. GI: Soft, nontender, non-distended  MS: No edema; No deformity. Neuro:  Nonfocal  Psych: Normal affect   Labs    Chemistry Recent Labs  Lab 12/24/17 0821 12/25/17 0341  NA 139 138  K 4.0 4.2  CL 107 108  CO2 25 17*  GLUCOSE 90 99  BUN 11 7  CREATININE 0.83 0.78  CALCIUM 9.3 9.2  GFRNONAA >60 >60  GFRAA >60 >60  ANIONGAP 7 13     Hematology Recent Labs  Lab 12/24/17 0821 12/25/17 0341  WBC 7.6 11.3*  RBC 4.09 4.06  HGB 11.7* 11.6*    HCT 34.1* 33.5*  MCV 83.4 82.5  MCH 28.6 28.6  MCHC 34.3 34.6  RDW 12.9 12.8  PLT 255 234    Cardiac EnzymesNo results for input(s): TROPONINI in the last 168 hours. No results for input(s): TROPIPOC in the last 168 hours.   BNPNo results for input(s): BNP, PROBNP in the last 168 hours.   DDimer No results for input(s): DDIMER in the last 168 hours.   Radiology    No results found.  Cardiac Studies   Reviewed coronary digital images from 12/24/2017: Post-Intervention Diagram         Patient Profile     52 y.o. female with strong family history of premature coronary atherosclerosis, abnormal coronary CTA although technically suboptimal, and progressive exertional dyspnea for greater than a year.  Severe diffuse multivessel coronary disease identified.  LAD stent placed with good result.  Untreated high-grade small ramus intermedius branch.  Stent to the proximal circumflex with angioplasty of smaller diffusely diseased segment beyond stent placement.  Angiographically good result post procedure.  Assessment & Plan    1. Multivessel severe diffuse coronary disease with stent implantation in  the LAD covering the focally diseased segment and angioplasty and stenting partially covering the diseased segment in the circumflex.  Mild dyspnea with exertion this morning.  Dyspnea as anginal equivalent.  Since there was circumflex dissection and vessel was too small to stent, 1 more day of observation before discharge she is warranted. 2. Hyperlipidemia, requiring high intensity statin therapy.  Increase atorvastatin to 80 mg daily. 3. Hypertension, requiring control to blood pressure less than 130/85 mmHg.  Add lisinopril for better blood pressure control and long-term risk modification. 4. We will check a hemoglobin A1c  If does well overnight will plan discharge tomorrow.  For questions or updates, please contact Anna Please consult [email or web address] for contact info under  Cardiology/STEMI.      Signed, Sinclair Grooms, MD  12/25/2017, 9:48 AM

## 2017-12-26 ENCOUNTER — Telehealth: Payer: Self-pay | Admitting: Cardiology

## 2017-12-26 ENCOUNTER — Encounter (HOSPITAL_COMMUNITY): Payer: Self-pay | Admitting: Cardiology

## 2017-12-26 DIAGNOSIS — Z955 Presence of coronary angioplasty implant and graft: Secondary | ICD-10-CM | POA: Diagnosis not present

## 2017-12-26 DIAGNOSIS — R0609 Other forms of dyspnea: Secondary | ICD-10-CM | POA: Diagnosis not present

## 2017-12-26 DIAGNOSIS — I209 Angina pectoris, unspecified: Secondary | ICD-10-CM | POA: Diagnosis not present

## 2017-12-26 DIAGNOSIS — Z8249 Family history of ischemic heart disease and other diseases of the circulatory system: Secondary | ICD-10-CM | POA: Diagnosis not present

## 2017-12-26 DIAGNOSIS — I25119 Atherosclerotic heart disease of native coronary artery with unspecified angina pectoris: Secondary | ICD-10-CM | POA: Diagnosis not present

## 2017-12-26 DIAGNOSIS — I5032 Chronic diastolic (congestive) heart failure: Secondary | ICD-10-CM | POA: Diagnosis not present

## 2017-12-26 MED ORDER — ATORVASTATIN CALCIUM 80 MG PO TABS: 80.0000 mg | ORAL_TABLET | Freq: Every day | ORAL | 2 refills | Status: DC

## 2017-12-26 MED ORDER — NITROGLYCERIN 0.4 MG SL SUBL: 0.4000 mg | SUBLINGUAL_TABLET | SUBLINGUAL | 2 refills | Status: DC | PRN

## 2017-12-26 MED ORDER — CLOPIDOGREL BISULFATE 75 MG PO TABS: 75.0000 mg | ORAL_TABLET | Freq: Every day | ORAL | 2 refills | Status: DC

## 2017-12-26 MED ORDER — LISINOPRIL 5 MG PO TABS
2.5000 mg | ORAL_TABLET | Freq: Every day | ORAL | Status: DC
  Administered 2017-12-26: 2.5 mg via ORAL
  Filled 2017-12-26: qty 1

## 2017-12-26 MED ORDER — LISINOPRIL 2.5 MG PO TABS: 2.5000 mg | ORAL_TABLET | Freq: Every day | ORAL | 2 refills | Status: DC

## 2017-12-26 NOTE — Telephone Encounter (Signed)
Patient need to be called 12/27/17. Patient is still admitted to hosptial

## 2017-12-26 NOTE — Progress Notes (Addendum)
Interventional Cardiology   Uneventful night.  No chest discomfort.  Ambulated this morning without chest discomfort.  Still unable to determine if dyspnea has improved, mostly because she has not exerted enough to evaluate.  BP is relatively low.  Decrease Lisinopril to 2.5 mg daily.  Ready for discharge.

## 2017-12-26 NOTE — Progress Notes (Signed)
CARDIAC REHAB PHASE I   PRE:  Rate/Rhythm: 75 SR    BP: sitting 92/59    SaO2:   MODE:  Ambulation: 1000 ft   POST:  Rate/Rhythm: 95 SR    BP: sitting 122/69     SaO2:   BP has been low this am but right now she is feeling well. Able to walk without any problems. Only slight dyspnea when she sat down after walking. BP up with walking. No questions from education. [place], ACSM 12/26/2017 9:22 AM

## 2017-12-26 NOTE — Telephone Encounter (Signed)
TOC Patient-Please call Patient-Pt has an appointment with Kerin Ransom on 01-08-17.

## 2017-12-27 ENCOUNTER — Telehealth: Payer: Self-pay

## 2017-12-27 ENCOUNTER — Telehealth (HOSPITAL_COMMUNITY): Payer: Self-pay

## 2017-12-27 NOTE — Telephone Encounter (Signed)
Transition Care Management Follow-up Telephone Call   Date discharged? 12/26/17   How have you been since you were released from the hospital? Patient is feeling better.   Do you understand why you were in the hospital? yes   Do you understand the discharge instructions? yes   Where were you discharged to? home   Items Reviewed:  Medications reviewed: yes  Allergies reviewed: yes  Dietary changes reviewed: yes  Referrals reviewed: yes   Functional Questionnaire:   Activities of Daily Living (ADLs):   She states they are independent in the following: ambulation, bathing and hygiene, feeding, continence, grooming, toileting and dressing States they require assistance with the following: none   Any transportation issues/concerns?: no   Any patient concerns? no   Confirmed importance and date/time of follow-up visits scheduled yes  Provider Appointment booked with Dr. Brigitte Pulse on 12/28/17 @ 9 am.   Confirmed with patient if condition begins to worsen call PCP or go to the ER.  Patient was given the office number and encouraged to call back with question or concerns.  : yes

## 2017-12-27 NOTE — Telephone Encounter (Signed)
Patients insurance is active and benefits verified through [facility] - $10.00 co-pay, no deductible, out of pocket amount of $2,500/$279.60 has been met, no co-insurance, and no pre-authorization is required. Passport/reference [phone]  Patient will be contacted and scheduled after review by the RN Navigator.

## 2017-12-27 NOTE — Telephone Encounter (Signed)
TOC outreach attempt #1 - LMTCB

## 2017-12-28 ENCOUNTER — Other Ambulatory Visit: Payer: Self-pay

## 2017-12-28 ENCOUNTER — Ambulatory Visit: Payer: 59 | Admitting: Family Medicine

## 2017-12-28 ENCOUNTER — Encounter: Payer: Self-pay | Admitting: Family Medicine

## 2017-12-28 VITALS — BP 116/72 | HR 66 | Temp 98.7°F | Resp 16 | Ht 63.39 in | Wt 133.0 lb

## 2017-12-28 DIAGNOSIS — R0609 Other forms of dyspnea: Secondary | ICD-10-CM

## 2017-12-28 DIAGNOSIS — I10 Essential (primary) hypertension: Secondary | ICD-10-CM | POA: Diagnosis not present

## 2017-12-28 DIAGNOSIS — I25708 Atherosclerosis of coronary artery bypass graft(s), unspecified, with other forms of angina pectoris: Secondary | ICD-10-CM | POA: Diagnosis not present

## 2017-12-28 DIAGNOSIS — E7800 Pure hypercholesterolemia, unspecified: Secondary | ICD-10-CM

## 2017-12-28 DIAGNOSIS — D72829 Elevated white blood cell count, unspecified: Secondary | ICD-10-CM

## 2017-12-28 DIAGNOSIS — Z955 Presence of coronary angioplasty implant and graft: Secondary | ICD-10-CM | POA: Diagnosis not present

## 2017-12-28 DIAGNOSIS — I5032 Chronic diastolic (congestive) heart failure: Secondary | ICD-10-CM | POA: Diagnosis not present

## 2017-12-28 LAB — POCT CBC
Granulocyte percent: 65.9 %G (ref 37–80)
HCT, POC: 34.2 % — AB (ref 37.7–47.9)
Hemoglobin: 11.5 g/dL — AB (ref 12.2–16.2)
LYMPH, POC: 2.2 (ref 0.6–3.4)
MCH, POC: 28.9 pg (ref 27–31.2)
MCHC: 33.7 g/dL (ref 31.8–35.4)
MCV: 85.5 fL (ref 80–97)
MID (CBC): 0.5 (ref 0–0.9)
MPV: 8.2 fL (ref 0–99.8)
POC GRANULOCYTE: 5.3 (ref 2–6.9)
POC LYMPH %: 28 % (ref 10–50)
POC MID %: 6.1 %M (ref 0–12)
Platelet Count, POC: 232 10*3/uL (ref 142–424)
RBC: 3.99 M/uL — AB (ref 4.04–5.48)
RDW, POC: 13 %
WBC: 8 10*3/uL (ref 4.6–10.2)

## 2017-12-28 NOTE — Progress Notes (Signed)
Subjective:    Patient ID: Nancy [name], female    DOB: October 04, 1966, 52 y.o.   MRN: [id number] Chief Complaint  Patient presents with  . Transitions Of Care    pt was seen in the ER on 1/22 for Angina Class 3 and would like to follow-up     HPI  Nancy [name] is a delightful 52 yo woman accompanied by her husband Hospitalized o/n for cardiac cath wit stent placement and advised that she could return to work after this visit today (her TOC visit) and that she will need to f/u with her primary cardiologist. She has appt w/ cardiology PA 2/6 so has been told that she has to stay out of work until then. Still fatigued but otherwise doing well w/ no recurrent of CP. Has been walking slowly - doing things around the house and tolerating that activity well w/o dyspnea.  Requests to have her dressings on her right arm from the cath removed today.  On asa and plavix for minimum of 1 yr. HLD: LIpitor increased to 80 at hosp 1/22.  Followed by Dr. Sallyanne Kuster. Follows with cardiology Dr. Sallyanne Kuster for hypertension, hyperlipidemia, and diastolic heart failure. Did not restart lasix yet.  Past Medical History:  Diagnosis Date  . Anxiety   . CAD (coronary artery disease)    1/19 PCI/DESx1 to LAD, and DESx2 to OM, normal EF via LV gram  . Hyperlipidemia   . Hypertension    Past Surgical History:  Procedure Laterality Date  . ABDOMINAL HYSTERECTOMY    . CORONARY ANGIOPLASTY WITH STENT PLACEMENT  12/24/2017  . CORONARY BALLOON ANGIOPLASTY N/A 12/24/2017   Procedure: CORONARY BALLOON ANGIOPLASTY;  Surgeon: Leonie Man, MD;  Location: [facility];  Service: Cardiovascular;  Laterality: N/A;  . CORONARY STENT INTERVENTION N/A 12/24/2017   Procedure: CORONARY STENT INTERVENTION;  Surgeon: Leonie Man, MD;  Location: [facility];  Service: Cardiovascular;  Laterality: N/A;  . DIAGNOSTIC LAPAROSCOPY    . ENDOMETRIAL ABLATION W/ NOVASURE    . LAPAROSCOPIC ASSISTED VAGINAL HYSTERECTOMY  N/A 07/07/2013   Procedure: LAPAROSCOPIC ASSISTED VAGINAL HYSTERECTOMY;  Surgeon: Darlyn Chamber, MD;  Location: [facility];  Service: Gynecology;  Laterality: N/A;  . RIGHT/LEFT HEART CATH AND CORONARY ANGIOGRAPHY N/A 12/24/2017   Procedure: RIGHT/LEFT HEART CATH AND CORONARY ANGIOGRAPHY;  Surgeon: Leonie Man, MD;  Location: [facility];  Service: Cardiovascular;  Laterality: N/A;  . TONSILLECTOMY    . TUBAL LIGATION     bilateral   Current Outpatient Medications on File Prior to Visit  Medication Sig Dispense Refill  . aspirin EC 81 MG tablet Take 1 tablet (81 mg total) by mouth daily. 90 tablet 3  . atorvastatin (LIPITOR) 80 MG tablet Take 1 tablet (80 mg total) by mouth daily. 30 tablet 2  . carvedilol (COREG) 6.25 MG tablet Take 1 tablet (6.25 mg total) by mouth 2 (two) times daily. 180 tablet 3  . clopidogrel (PLAVIX) 75 MG tablet Take 1 tablet (75 mg total) by mouth daily with breakfast. 90 tablet 2  . lisinopril (PRINIVIL,ZESTRIL) 2.5 MG tablet Take 1 tablet (2.5 mg total) by mouth daily. 30 tablet 2  . nitroGLYCERIN (NITROSTAT) 0.4 MG SL tablet Place 1 tablet (0.4 mg total) under the tongue every 5 (five) minutes as needed. 25 tablet 2  . traZODone (DESYREL) 100 MG tablet Take 1 tablet (100 mg total) by mouth at bedtime as needed for sleep. 90 tablet 1  No current facility-administered medications on file prior to visit.    No Known Allergies Family History  Problem Relation Age of Onset  . Coronary artery disease Father   . Hypertension Father   . Stroke Father   . Heart attack Father   . Heart disease Paternal Uncle   . Heart attack Paternal Uncle    Social History   Socioeconomic History  . Marital status: Married    Spouse name: None  . Number of children: None  . Years of education: None  . Highest education level: None  Social Needs  . Financial resource strain: None  . Food insecurity - worry: None  . Food insecurity - inability: None  . Transportation  needs - medical: None  . Transportation needs - non-medical: None  Occupational History  . None  Tobacco Use  . Smoking status: Never Smoker  . Smokeless tobacco: Never Used  Substance and Sexual Activity  . Alcohol use: Yes    Alcohol/week: 6.0 oz    Types: 10 Glasses of wine per week    Comment: 12/24/2017 "1-2 glasses of wine/day"  . Drug use: No  . Sexual activity: Yes    Birth control/protection: Other-see comments    Comment: pill  Other Topics Concern  . None  Social History Narrative  . None   Depression screen [facility] 2/9 12/28/2017 08/08/2017 08/17/2016 01/24/2016 07/29/2015  Decreased Interest 0 0 0 0 0  Down, Depressed, Hopeless 0 0 0 0 0  PHQ - 2 Score 0 0 0 0 0    Review of Systems  Constitutional: Positive for fatigue. Negative for activity change, appetite change, chills, diaphoresis and fever.  Eyes: Negative for visual disturbance.  Respiratory: Negative for cough and shortness of breath.   Cardiovascular: Negative for chest pain, palpitations and leg swelling.  Genitourinary: Negative for decreased urine volume.  Neurological: Negative for syncope and headaches.  Hematological: Does not bruise/bleed easily.       Objective:   Physical Exam  Constitutional: She is oriented to person, place, and time. She appears well-developed and well-nourished. No distress.  HENT:  Head: Normocephalic and atraumatic.  Right Ear: External ear normal.  Left Ear: External ear normal.  Eyes: Conjunctivae are normal. No scleral icterus.  Neck: Normal range of motion. Neck supple. No thyromegaly present.  Cardiovascular: Normal rate, regular rhythm, normal heart sounds and intact distal pulses.  Pulmonary/Chest: Effort normal and breath sounds normal. No respiratory distress.  Musculoskeletal: She exhibits no edema.  Lymphadenopathy:    She has no cervical adenopathy.  Neurological: She is alert and oriented to person, place, and time.  Skin: Skin is warm and dry. She is not  diaphoretic. No erythema.  Left arm cath and IV sites well healed - small puncture wound in Lt antecubital fossa and 1/3 cm well healed incision at Left radial artery just proximal to wrist. No erythema, drainage, fluctuance, induration, tenderness, hematoma, or warmth noted.   Psychiatric: She has a normal mood and affect. Her behavior is normal.      BP 116/72   Pulse 66   Temp 98.7 F (37.1 C)   Resp 16   Ht 5' 3.39" (1.61 m)   Wt 133 lb (60.3 kg)   LMP 04/17/2013   SpO2 100%   BMI 23.27 kg/m    Results for orders placed or performed in visit on 12/28/17  POCT CBC  Result Value Ref Range   WBC 8.0 4.6 - 10.2 K/uL   Lymph,  poc 2.2 0.6 - 3.4   POC LYMPH PERCENT 28.0 10 - 50 %L   MID (cbc) 0.5 0 - 0.9   POC MID % 6.1 0 - 12 %M   POC Granulocyte 5.3 2 - 6.9   Granulocyte percent 65.9 37 - 80 %G   RBC 3.99 (A) 4.04 - 5.48 M/uL   Hemoglobin 11.5 (A) 12.2 - 16.2 g/dL   HCT, POC 34.2 (A) 37.7 - 47.9 %   MCV 85.5 80 - 97 fL   MCH, POC 28.9 27 - 31.2 pg   MCHC 33.7 31.8 - 35.4 g/dL   RDW, POC 13.0 %   Platelet Count, POC 232 142 - 424 K/uL   MPV 8.2 0 - 99.8 fL        Assessment & Plan:   1. Status post coronary artery stent placement - doing great, continue to increase activity slowly as tolerated. Is oow until cards visit 2/6.  2. Leukocytosis, unspecified type - in hosp inflammatory response to the cath, now resolved, hgb good  3. Coronary artery disease of bypass graft of native heart with stable angina pectoris ([place]) - no angina since hosp - doing well, cont on asa, plavix, bb, acei, statin  4. Benign essential HTN   5. Chronic diastolic heart failure (HCC) - no sign of volume overload, continue to hold off lasix  6. Dyspnea on exertion - so far not noticed  7. Pure hypercholesterolemia - tolerating lipitor 80 well    Orders Placed This Encounter  Procedures  . POCT CBC     Delman Cheadle, M.D.  Primary Care at [facility] [address] [place],  [place] [address] [phone] phone [phone] fax  12/28/17 10:38 AM

## 2017-12-28 NOTE — Patient Instructions (Signed)
     IF you received an x-ray today, you will receive an invoice from [facility]. Please contact [facility] at [phone] with questions or concerns regarding your invoice.   IF you received labwork today, you will receive an invoice from [facility]. Please contact [facility] at [phone] with questions or concerns regarding your invoice.   Our billing staff will not be able to assist you with questions regarding bills from these companies.  You will be contacted with the lab results as soon as they are available. The fastest way to get your results is to activate your My Chart account. Instructions are located on the last page of this paperwork. If you have not heard from us regarding the results in 2 weeks, please contact this office.     

## 2017-12-30 ENCOUNTER — Telehealth (HOSPITAL_COMMUNITY): Payer: Self-pay

## 2017-12-30 NOTE — Telephone Encounter (Signed)
Patient contacted regarding discharge from [facility] on 12/26/17.  Patient understands to follow up with provider Kerin Ransom, PA on 01/08/18 at 8:30 at the [facility] office. Patient understands discharge instructions? yes Patient understands medications and regiment? yes Patient understands to bring all medications to this visit? yes  Patient stated that she felt good and did not have any questions at this time. She has been advised to call if she needs anything before her appointment.

## 2017-12-30 NOTE — Telephone Encounter (Signed)
Called to speak with patient in regards to Cardiac Rehab - Patient is interested in the program. Scheduled orientation on 02/06/2018 at 7:30am. Patient will attend the 6:45am exc class.

## 2018-01-08 ENCOUNTER — Encounter: Payer: Self-pay | Admitting: Cardiology

## 2018-01-08 ENCOUNTER — Ambulatory Visit: Payer: 59 | Admitting: Cardiology

## 2018-01-08 VITALS — BP 120/66 | HR 60 | Ht 63.0 in | Wt 133.4 lb

## 2018-01-08 DIAGNOSIS — R0609 Other forms of dyspnea: Secondary | ICD-10-CM

## 2018-01-08 DIAGNOSIS — E785 Hyperlipidemia, unspecified: Secondary | ICD-10-CM | POA: Diagnosis not present

## 2018-01-08 DIAGNOSIS — Z9861 Coronary angioplasty status: Secondary | ICD-10-CM | POA: Diagnosis not present

## 2018-01-08 DIAGNOSIS — I5032 Chronic diastolic (congestive) heart failure: Secondary | ICD-10-CM | POA: Diagnosis not present

## 2018-01-08 DIAGNOSIS — Z79899 Other long term (current) drug therapy: Secondary | ICD-10-CM

## 2018-01-08 DIAGNOSIS — I10 Essential (primary) hypertension: Secondary | ICD-10-CM | POA: Diagnosis not present

## 2018-01-08 DIAGNOSIS — E7800 Pure hypercholesterolemia, unspecified: Secondary | ICD-10-CM

## 2018-01-08 DIAGNOSIS — R0600 Dyspnea, unspecified: Secondary | ICD-10-CM

## 2018-01-08 DIAGNOSIS — I2510 Atherosclerotic heart disease of native coronary artery without angina pectoris: Secondary | ICD-10-CM

## 2018-01-08 DIAGNOSIS — Z8249 Family history of ischemic heart disease and other diseases of the circulatory system: Secondary | ICD-10-CM

## 2018-01-08 MED ORDER — LOSARTAN POTASSIUM 25 MG PO TABS: 12.5000 mg | ORAL_TABLET | Freq: Every day | ORAL | 3 refills | Status: DC

## 2018-01-08 NOTE — Patient Instructions (Signed)
Medication Instructions:  STOP lisinopril  START Losartan 12.5 mg (1/2 tablet) daily  **DO NOT STOP PLAVIX FOR ANY REASON WITHOUT CLEARANCE FROM CARDIOLOGY**  Labwork: Please return for FASTING labs in 3 months (CMET,Lipid)-lab orders provided  Follow-Up: 3 months with Dr. Sallyanne Kuster  Any Other Special Instructions Will Be Listed Below (If Applicable).     If you need a refill on your cardiac medications before your next appointment, please call your pharmacy.

## 2018-01-08 NOTE — Progress Notes (Signed)
01/08/2018 Nancy [name]   1966-05-22  [id number]  Primary Physician Shawnee Knapp, MD Primary Cardiologist: Dr Sallyanne Kuster  HPI:   52 y/o AA female, works as a Education officer, museum, with a family history of CAD- (her father had an MI in his 38's and is s/p CABG), and HLD. Pt has been followed by Dr Sallyanne Kuster with a history of DOE and past chest pain. Prior echo 10/19/16 showed an EF of 55-60% with grade 2 DD.  A stress echo Nov 2017 was negative for ischemia. Because of continued symptoms she had a coronary CTA 12/11/17. Unfortunately this was a non diagnostic study due to bolus timing. She did have CAD, possibly > 50% in her LAD. Her Ca++ score was 245-99% for her age. The pt 's main complaint to me is dyspnea with exertion. I saw her in the office 12/20/17. It was decided to proceed with diagnostic catheterization. This was done 12/24/17 and revealed a 35% RCA, high grade pLAD and large OM2. The RI was small and had a 95% proximal stenosis. She underwent PCI with DES to the OM2 and LAD. She is in the office today for follow up. Her DOE is improved. She does have a dry cough since starting Lisinopril 2.5 mg.    Current Outpatient Medications  Medication Sig Dispense Refill  . aspirin EC 81 MG tablet Take 1 tablet (81 mg total) by mouth daily. 90 tablet 3  . atorvastatin (LIPITOR) 80 MG tablet Take 1 tablet (80 mg total) by mouth daily. 30 tablet 2  . carvedilol (COREG) 6.25 MG tablet Take 1 tablet (6.25 mg total) by mouth 2 (two) times daily. 180 tablet 3  . clopidogrel (PLAVIX) 75 MG tablet Take 1 tablet (75 mg total) by mouth daily with breakfast. 90 tablet 2  . lisinopril (PRINIVIL,ZESTRIL) 2.5 MG tablet Take 1 tablet (2.5 mg total) by mouth daily. 30 tablet 2  . nitroGLYCERIN (NITROSTAT) 0.4 MG SL tablet Place 1 tablet (0.4 mg total) under the tongue every 5 (five) minutes as needed. 25 tablet 2  . traZODone (DESYREL) 100 MG tablet Take 1 tablet (100 mg total) by mouth at bedtime as needed for sleep. 90  tablet 1   No current facility-administered medications for this visit.     No Known Allergies  Past Medical History:  Diagnosis Date  . Anxiety   . CAD (coronary artery disease)    1/19 PCI/DESx1 to LAD, and DESx2 to OM, normal EF via LV gram  . Hyperlipidemia   . Hypertension     Social History   Socioeconomic History  . Marital status: Married    Spouse name: Not on file  . Number of children: Not on file  . Years of education: Not on file  . Highest education level: Not on file  Social Needs  . Financial resource strain: Not on file  . Food insecurity - worry: Not on file  . Food insecurity - inability: Not on file  . Transportation needs - medical: Not on file  . Transportation needs - non-medical: Not on file  Occupational History  . Not on file  Tobacco Use  . Smoking status: Never Smoker  . Smokeless tobacco: Never Used  Substance and Sexual Activity  . Alcohol use: Yes    Alcohol/week: 6.0 oz    Types: 10 Glasses of wine per week    Comment: 12/24/2017 "1-2 glasses of wine/day"  . Drug use: No  . Sexual activity: Yes  Birth control/protection: Other-see comments    Comment: pill  Other Topics Concern  . Not on file  Social History Narrative  . Not on file     Family History  Problem Relation Age of Onset  . Coronary artery disease Father   . Hypertension Father   . Stroke Father   . Heart attack Father   . Heart disease Paternal Uncle   . Heart attack Paternal Uncle      Review of Systems: General: negative for chills, fever, night sweats or weight changes.  Cardiovascular: negative for chest pain, dyspnea on exertion, edema, orthopnea, palpitations, paroxysmal nocturnal dyspnea or shortness of breath Dermatological: negative for rash Respiratory: negative for cough or wheezing Urologic: negative for hematuria Abdominal: negative for nausea, vomiting, diarrhea, bright red blood per rectum, melena, or hematemesis Neurologic: negative for  visual changes, syncope, or dizziness All other systems reviewed and are otherwise negative except as noted above.    Blood pressure 120/66, pulse 60, height 5\' 3"  (1.6 m), weight 133 lb 6.4 oz (60.5 kg), last menstrual period 04/17/2013.  General appearance: alert, cooperative and no distress Neck: no carotid bruit and no JVD Lungs: clear to auscultation bilaterally Heart: regular rate and rhythm Extremities: extremities normal, atraumatic, no cyanosis or edema Skin: Skin color, texture, turgor normal. No rashes or lesions Neurologic: Grossly normal  EKG [id number]  ASSESSMENT AND PLAN:   CAD s/p PCI S/p 2 V PCI with DES -LAD and OM2 12/24/17  Dyspnea on exertion Anginal equivalent  Hyperlipidemia On statin Rx  Chronic diastolic heart failure (HCC) Grade 2 DD on echo Nov 2017  Family history of coronary artery disease in father Father had MI/ CABG in his 82's   PLAN  Change low dose ACE to low dose ARB secondary to cough. OK to return to work 01/13/18 ( she did have overlapping DES with PTCA distal to stent 2/2 wire dissection and was kept overnight). Check Lipids and CMET in 3 months.   Kerin Ransom PA-C 01/08/2018 9:04 AM

## 2018-01-08 NOTE — Progress Notes (Signed)
And thanks again MCr 

## 2018-01-20 ENCOUNTER — Ambulatory Visit: Payer: 59 | Admitting: Cardiology

## 2018-02-03 ENCOUNTER — Telehealth (HOSPITAL_COMMUNITY): Payer: Self-pay

## 2018-02-03 NOTE — Telephone Encounter (Signed)
Cardiac Rehab Medication Review by a Pharmacist  Does the patient  feel that his/her medications are working for him/her?  yes  Has the patient been experiencing any side effects to the medications prescribed?  no  Does the patient measure his/her own blood pressure or blood glucose at home?  yes   Does the patient have any problems obtaining medications due to transportation or finances?   no  Understanding of regimen: good Understanding of indications: good Potential of compliance: good    Pharmacist comments: Patient was able to tell me the drug, strength, and frequency of her meds. Patient reports no issues with obtaining meds at this time.    Blaine Hamper Ralpheal Zappone 02/03/2018 5:13 PM

## 2018-02-06 ENCOUNTER — Encounter (HOSPITAL_COMMUNITY)
Admission: RE | Admit: 2018-02-06 | Discharge: 2018-02-06 | Disposition: A | Discharge status: Home / Self Care | Payer: 59 | Source: Ambulatory Visit | Attending: Cardiovascular Disease | Admitting: Cardiovascular Disease

## 2018-02-06 ENCOUNTER — Encounter (HOSPITAL_COMMUNITY): Payer: Self-pay

## 2018-02-06 VITALS — BP 98/60 | HR 79 | Ht 63.0 in | Wt 134.5 lb

## 2018-02-06 DIAGNOSIS — I2510 Atherosclerotic heart disease of native coronary artery without angina pectoris: Secondary | ICD-10-CM | POA: Diagnosis not present

## 2018-02-06 DIAGNOSIS — Z7982 Long term (current) use of aspirin: Secondary | ICD-10-CM | POA: Insufficient documentation

## 2018-02-06 DIAGNOSIS — Z79899 Other long term (current) drug therapy: Secondary | ICD-10-CM | POA: Insufficient documentation

## 2018-02-06 DIAGNOSIS — Z955 Presence of coronary angioplasty implant and graft: Secondary | ICD-10-CM | POA: Insufficient documentation

## 2018-02-06 DIAGNOSIS — E785 Hyperlipidemia, unspecified: Secondary | ICD-10-CM | POA: Insufficient documentation

## 2018-02-06 DIAGNOSIS — I10 Essential (primary) hypertension: Secondary | ICD-10-CM | POA: Insufficient documentation

## 2018-02-06 NOTE — Progress Notes (Addendum)
Cardiac Individual Treatment Plan  Patient Details  Name: Nancy [name] MRN: [id number] Date of Birth: [birth year] Referring Provider:     CARDIAC REHAB PHASE II ORIENTATION from 02/06/2018 in [place]  Referring Provider  Croitour, Dani Gobble MD      Initial Encounter Date:    CARDIAC REHAB PHASE II ORIENTATION from 02/06/2018 in [place]  Date  02/06/18  Referring Provider  Croitour, Mihai MD      Visit Diagnosis: Status post coronary artery stent placement DES to LAD, OM 12/24/17  Patient's Home Medications on Admission:  Current Outpatient Medications:  .  aspirin EC 81 MG tablet, Take 1 tablet (81 mg total) by mouth daily., Disp: 90 tablet, Rfl: 3 .  atorvastatin (LIPITOR) 80 MG tablet, Take 1 tablet (80 mg total) by mouth daily., Disp: 30 tablet, Rfl: 2 .  carvedilol (COREG) 6.25 MG tablet, Take 1 tablet (6.25 mg total) by mouth 2 (two) times daily., Disp: 180 tablet, Rfl: 3 .  clopidogrel (PLAVIX) 75 MG tablet, Take 1 tablet (75 mg total) by mouth daily with breakfast., Disp: 90 tablet, Rfl: 2 .  losartan (COZAAR) 25 MG tablet, Take 0.5 tablets (12.5 mg total) by mouth daily., Disp: 45 tablet, Rfl: 3 .  nitroGLYCERIN (NITROSTAT) 0.4 MG SL tablet, Place 1 tablet (0.4 mg total) under the tongue every 5 (five) minutes as needed., Disp: 25 tablet, Rfl: 2 .  traZODone (DESYREL) 100 MG tablet, Take 1 tablet (100 mg total) by mouth at bedtime as needed for sleep., Disp: 90 tablet, Rfl: 1  Past Medical History: Past Medical History:  Diagnosis Date  . Anxiety   . CAD (coronary artery disease)    1/19 PCI/DESx1 to LAD, and DESx2 to OM, normal EF via LV gram  . Hyperlipidemia   . Hypertension     Tobacco Use: Social History   Tobacco Use  Smoking Status Never Smoker  Smokeless Tobacco Never Used    Labs: Recent Review Flowsheet Data    Labs for ITP Cardiac and Pulmonary Rehab Latest Ref Rng & Units 03/31/2015  08/17/2016 12/24/2017 12/24/2017 12/24/2017   Cholestrol 125 - 200 mg/dL 212(H) 198 - - -   LDLCALC <130 mg/dL 118(H) 122 - - -   HDL >=46 mg/dL 79 64 - - -   Trlycerides <150 mg/dL 75 59 - - -   Hemoglobin A1c <5.7 % - 4.4 - - -   PHART 7.350 - 7.450 - - 7.325(L) - -   PCO2ART 32.0 - 48.0 mmHg - - 44.1 - -   HCO3 20.0 - 28.0 mmol/L - - 23.0 22.7 22.4   TCO2 22 - 32 mmol/L - - 24 24 24    ACIDBASEDEF 0.0 - 2.0 mmol/L - - 3.0(H) 3.0(H) 3.0(H)   O2SAT % - - 99.0 76.0 77.0      Capillary Blood Glucose: No results found for: GLUCAP   Exercise Target Goals: Date: 02/06/18  Exercise Program Goal: Individual exercise prescription set using results from initial 6 min walk test and THRR while considering  patient's activity barriers and safety.   Exercise Prescription Goal: Initial exercise prescription builds to 30-45 minutes a day of aerobic activity, 2-3 days per week.  Home exercise guidelines will be given to patient during program as part of exercise prescription that the participant will acknowledge.  Activity Barriers & Risk Stratification: Activity Barriers & Cardiac Risk Stratification - 02/06/18 0838      Activity Barriers &  Cardiac Risk Stratification   Activity Barriers  None    Cardiac Risk Stratification  Moderate       6 Minute Walk: 6 Minute Walk    Row Name 02/06/18 1019         6 Minute Walk   Phase  Initial     Distance  1628 feet     Walk Time  6 minutes     # of Rest Breaks  0     MPH  3     METS  4.5     RPE  8     Perceived Dyspnea   0     VO2 Peak  15.8     Symptoms  No     Resting HR  79 bpm     Resting BP  98/62     Resting Oxygen Saturation   100 %     Exercise Oxygen Saturation  during 6 min walk  100 %     Max Ex. HR  99 bpm     Max Ex. BP  126/70     2 Minute Post BP  118/68        Oxygen Initial Assessment:   Oxygen Re-Evaluation:   Oxygen Discharge (Final Oxygen Re-Evaluation):   Initial Exercise Prescription: Initial  Exercise Prescription - 02/06/18 1000      Date of Initial Exercise RX and Referring Provider   Date  02/06/18    Referring Provider  Croitour, Mihai MD      Treadmill   MPH  2.7    Grade  1    Minutes  10    METs  3.44      Bike   Level  2.5    Minutes  10    METs  3      NuStep   Level  3    SPM  75    Minutes  10    METs  3      Prescription Details   Frequency (times per week)  x    Duration  Progress to 45 minutes of aerobic exercise without signs/symptoms of physical distress      Intensity   THRR 40-80% of Max Heartrate  67-134    Ratings of Perceived Exertion  11-13    Perceived Dyspnea  0-4      Progression   Progression  Continue progressive overload as per policy without signs/symptoms or physical distress.      Resistance Training   Training Prescription  Yes    Weight  3lbs    Reps  10-15       Perform Capillary Blood Glucose checks as needed.  Exercise Prescription Changes:   Exercise Comments:   Exercise Goals and Review:  Exercise Goals    Row Name 02/06/18 [phone]             Exercise Goals   Increase Physical Activity  Yes       Intervention  Provide advice, education, support and counseling about physical activity/exercise needs.;Develop an individualized exercise prescription for aerobic and resistive training based on initial evaluation findings, risk stratification, comorbidities and participant's personal goals.       Expected Outcomes  Short Term: Attend rehab on a regular basis to increase amount of physical activity.;Long Term: Add in home exercise to make exercise part of routine and to increase amount of physical activity.;Long Term: Exercising regularly at least 3-5 days a week.       Increase  Strength and Stamina  Yes       Intervention  Provide advice, education, support and counseling about physical activity/exercise needs.;Develop an individualized exercise prescription for aerobic and resistive training based on initial  evaluation findings, risk stratification, comorbidities and participant's personal goals.       Expected Outcomes  Short Term: Increase workloads from initial exercise prescription for resistance, speed, and METs.;Short Term: Perform resistance training exercises routinely during rehab and add in resistance training at home;Long Term: Improve cardiorespiratory fitness, muscular endurance and strength as measured by increased METs and functional capacity (6MWT)       Able to understand and use rate of perceived exertion (RPE) scale  Yes       Intervention  Provide education and explanation on how to use RPE scale       Expected Outcomes  Short Term: Able to use RPE daily in rehab to express subjective intensity level;Long Term:  Able to use RPE to guide intensity level when exercising independently       Knowledge and understanding of Target Heart Rate Range (THRR)  Yes       Intervention  Provide education and explanation of THRR including how the numbers were predicted and where they are located for reference       Expected Outcomes  Short Term: Able to state/look up THRR;Short Term: Able to use daily as guideline for intensity in rehab;Long Term: Able to use THRR to govern intensity when exercising independently       Able to check pulse independently  Yes       Intervention  Provide education and demonstration on how to check pulse in carotid and radial arteries.;Review the importance of being able to check your own pulse for safety during independent exercise       Expected Outcomes  Short Term: Able to explain why pulse checking is important during independent exercise;Long Term: Able to check pulse independently and accurately       Understanding of Exercise Prescription  Yes       Intervention  Provide education, explanation, and written materials on patient's individual exercise prescription       Expected Outcomes  Short Term: Able to explain program exercise prescription;Long Term: Able to  explain home exercise prescription to exercise independently          Exercise Goals Re-Evaluation :    Discharge Exercise Prescription (Final Exercise Prescription Changes):   Nutrition:  Target Goals: Understanding of nutrition guidelines, daily intake of sodium 1500mg , cholesterol 200mg , calories 30% from fat and 7% or less from saturated fats, daily to have 5 or more servings of fruits and vegetables.  Biometrics: Pre Biometrics - 02/06/18 1022      Pre Biometrics   Height  5\' 3"  (1.6 m)    Weight  134 lb 7.7 oz (61 kg)    Waist Circumference  29 inches    Hip Circumference  36.5 inches    Waist to Hip Ratio  0.79 %    BMI (Calculated)  23.83    Triceps Skinfold  22 mm    % Body Fat  32.5 %    Grip Strength  37 kg    Flexibility  17 in    Single Leg Stand  30 seconds        Nutrition Therapy Plan and Nutrition Goals: Nutrition Therapy & Goals - 02/06/18 1105      Nutrition Therapy   Diet  Heart Healthy  Personal Nutrition Goals   Nutrition Goal  Pt to make good choices when eating out at restaurants.      Intervention Plan   Intervention  Prescribe, educate and counsel regarding individualized specific dietary modifications aiming towards targeted core components such as weight, hypertension, lipid management, diabetes, heart failure and other comorbidities.    Expected Outcomes  Short Term Goal: Understand basic principles of dietary content, such as calories, fat, sodium, cholesterol and nutrients.;Long Term Goal: Adherence to prescribed nutrition plan.       Nutrition Assessments: Nutrition Assessments - 02/06/18 1105      MEDFICTS Scores   Pre Score  36       Nutrition Goals Re-Evaluation:   Nutrition Goals Re-Evaluation:   Nutrition Goals Discharge (Final Nutrition Goals Re-Evaluation):   Psychosocial: Target Goals: Acknowledge presence or absence of significant depression and/or stress, maximize coping skills, provide positive support  system. Participant is able to verbalize types and ability to use techniques and skills needed for reducing stress and depression.  Initial Review & Psychosocial Screening: Initial Psych Review & Screening - 02/06/18 1211      Initial Review   Current issues with  Current Stress Concerns      Family Dynamics   Good Support System?  Yes      Barriers   Psychosocial barriers to participate in program  The patient should benefit from training in stress management and relaxation.      Screening Interventions   Interventions  Encouraged to exercise    Expected Outcomes  Long Term Goal: Stressors or current issues are controlled or eliminated.       Quality of Life Scores:  Scores of 19 and below usually indicate a poorer quality of life in these areas.  A difference of  2-3 points is a clinically meaningful difference.  A difference of 2-3 points in the total score of the Quality of Life Index has been associated with significant improvement in overall quality of life, self-image, physical symptoms, and general health in studies assessing change in quality of life.  PHQ-9: Recent Review Flowsheet Data    Depression screen [facility] 2/9 12/28/2017 08/08/2017 08/17/2016 01/24/2016 07/29/2015   Decreased Interest 0 0 0 0 0   Down, Depressed, Hopeless 0 0 0 0 0   PHQ - 2 Score 0 0 0 0 0     Interpretation of Total Score  Total Score Depression Severity:  1-4 = Minimal depression, 5-9 = Mild depression, 10-14 = Moderate depression, 15-19 = Moderately severe depression, 20-27 = Severe depression   Psychosocial Evaluation and Intervention:   Psychosocial Re-Evaluation:   Psychosocial Discharge (Final Psychosocial Re-Evaluation):   Vocational Rehabilitation: Provide vocational rehab assistance to qualifying candidates.   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 Participant will state understanding/return demonstration of topics presented.  Learning Barriers/Preferences: Learning Barriers/Preferences - 02/06/18 1209      Learning Barriers/Preferences   Learning Barriers  None    Learning Preferences  Video;Written Material;Pictoral       Education Topics: Count Your Pulse:  -Group instruction provided by verbal instruction, demonstration, patient participation and written materials to support subject.  Instructors address importance of being able to find your pulse and how to count your pulse when at home without a heart monitor.  Patients get hands on experience counting their pulse with staff help and individually.   Heart Attack, Angina, and Risk Factor Modification:  -Group instruction provided by verbal instruction, video, and written materials to support subject.  Instructors address signs and symptoms of angina and heart attacks.    Also discuss risk factors for heart disease and how to make changes to improve heart health risk factors.   Functional Fitness:  -Group instruction provided by verbal instruction, demonstration, patient participation, and written materials to support subject.  Instructors address safety measures for doing things around the house.  Discuss how to get up and down off the floor, how to pick things up properly, how to safely get out of a chair without assistance, and balance training.   Meditation and Mindfulness:  -Group instruction provided by verbal instruction, patient participation, and written materials to support subject.  Instructor addresses importance of mindfulness and meditation practice to help reduce stress and improve awareness.  Instructor also leads participants through a meditation exercise.     Stretching for Flexibility and Mobility:  -Group instruction provided by verbal instruction, patient participation, and written materials to support subject.  Instructors lead participants through series of stretches that are designed to increase flexibility thus improving mobility.  These stretches are additional exercise for major muscle groups that are typically performed during regular warm up and cool down.   Hands Only CPR:  -Group verbal, video, and participation provides a basic overview of AHA guidelines for community CPR. Role-play of emergencies allow participants the opportunity to practice calling for help and chest compression technique with discussion of AED use.   Hypertension: -Group verbal and written instruction that provides a basic overview of hypertension including the most recent diagnostic guidelines, risk factor reduction with self-care instructions and medication management.    Nutrition I class: Heart Healthy Eating:  -Group instruction provided by PowerPoint slides, verbal discussion, and written materials to support subject matter. The instructor gives an explanation and review of the Therapeutic Lifestyle Changes diet recommendations, which includes a discussion on lipid goals, dietary fat, sodium, fiber, plant stanol/sterol esters, sugar, and the components of a well-balanced, healthy diet.   Nutrition II class: Lifestyle Skills:  -Group instruction provided by PowerPoint slides, verbal discussion, and written materials to support subject matter. The instructor gives an explanation and review of label reading, grocery shopping for heart health, heart healthy recipe modifications, and ways to make healthier choices when eating out.   Diabetes Question & Answer:  -Group instruction provided by PowerPoint slides, verbal discussion, and written materials to support subject matter. The instructor gives an explanation and review of diabetes co-morbidities, pre-  and post-prandial blood glucose goals, pre-exercise blood glucose goals, signs, symptoms, and treatment of hypoglycemia and hyperglycemia, and foot care basics.   Diabetes Blitz:  -Group instruction provided by PowerPoint slides, verbal discussion, and written materials to support subject matter. The instructor gives an explanation and review of the physiology behind type 1 and type 2 diabetes, diabetes medications and rational behind using different medications, pre- and post-prandial blood glucose recommendations and Hemoglobin A1c goals, diabetes diet, and exercise including blood glucose guidelines for exercising safely.    Portion Distortion:  -Group instruction provided by PowerPoint slides, verbal discussion, written materials, and food models to support subject matter. The instructor gives an explanation of serving size versus portion size,  changes in portions sizes over the last 20 years, and what consists of a serving from each food group.   Stress Management:  -Group instruction provided by verbal instruction, video, and written materials to support subject matter.  Instructors review role of stress in heart disease and how to cope with stress positively.     Exercising on Your Own:  -Group instruction provided by verbal instruction, power point, and written materials to support subject.  Instructors discuss benefits of exercise, components of exercise, frequency and intensity of exercise, and end points for exercise.  Also discuss use of nitroglycerin and activating EMS.  Review options of places to exercise outside of rehab.  Review guidelines for sex with heart disease.   Cardiac Drugs I:  -Group instruction provided by verbal instruction and written materials to support subject.  Instructor reviews cardiac drug classes: antiplatelets, anticoagulants, beta blockers, and statins.  Instructor discusses reasons, side effects, and lifestyle considerations for each drug  class.   Cardiac Drugs II:  -Group instruction provided by verbal instruction and written materials to support subject.  Instructor reviews cardiac drug classes: angiotensin converting enzyme inhibitors (ACE-I), angiotensin II receptor blockers (ARBs), nitrates, and calcium channel blockers.  Instructor discusses reasons, side effects, and lifestyle considerations for each drug class.   Anatomy and Physiology of the Circulatory System:  Group verbal and written instruction and models provide basic cardiac anatomy and physiology, with the coronary electrical and arterial systems. Review of: AMI, Angina, Valve disease, Heart Failure, Peripheral Artery Disease, Cardiac Arrhythmia, Pacemakers, and the ICD.   Other Education:  -Group or individual verbal, written, or video instructions that support the educational goals of the cardiac rehab program.   Holiday Eating Survival Tips:  -Group instruction provided by PowerPoint slides, verbal discussion, and written materials to support subject matter. The instructor gives patients tips, tricks, and techniques to help them not only survive but enjoy the holidays despite the onslaught of food that accompanies the holidays.   Knowledge Questionnaire Score:   Core Components/Risk Factors/Patient Goals at Admission: Personal Goals and Risk Factors at Admission - 02/06/18 1219      Core Components/Risk Factors/Patient Goals on Admission    Weight Management  Weight Maintenance;Yes    Intervention  Weight Management: Develop a combined nutrition and exercise program designed to reach desired caloric intake, while maintaining appropriate intake of nutrient and fiber, sodium and fats, and appropriate energy expenditure required for the weight goal.;Weight Management: Provide education and appropriate resources to help participant work on and attain dietary goals.;Weight Management/Obesity: Establish reasonable short term and long term weight goals.     Admit Weight  134 lb 7.7 oz (61 kg)    Goal Weight: Long Term  130 lb (59 kg)    Expected Outcomes  Short Term: Continue to assess and modify interventions until short term weight is achieved;Long Term: Adherence to nutrition and physical activity/exercise program aimed toward attainment of established weight goal;Weight Maintenance: Understanding of the daily nutrition guidelines, which includes 25-35% calories from fat, 7% or less cal from saturated fats, less than 200mg  cholesterol, less than 1.5gm of sodium, & 5 or more servings of fruits and vegetables daily;Understanding recommendations for meals to include 15-35% energy as protein, 25-35% energy from fat, 35-60% energy from carbohydrates, less than 200mg  of dietary cholesterol, 20-35 gm of total fiber daily;Understanding of distribution of calorie intake throughout the day with the consumption of 4-5 meals/snacks    Hypertension  Yes    Intervention  Provide education  on lifestyle modifcations including regular physical activity/exercise, weight management, moderate sodium restriction and increased consumption of fresh fruit, vegetables, and low fat dairy, alcohol moderation, and smoking cessation.;Monitor prescription use compliance.    Expected Outcomes  Short Term: Continued assessment and intervention until BP is < 140/34mm HG in hypertensive participants. < 130/50mm HG in hypertensive participants with diabetes, heart failure or chronic kidney disease.;Long Term: Maintenance of blood pressure at goal levels.    Lipids  Yes    Intervention  Provide education and support for participant on nutrition & aerobic/resistive exercise along with prescribed medications to achieve LDL 70mg , HDL >40mg .    Expected Outcomes  Short Term: Participant states understanding of desired cholesterol values and is compliant with medications prescribed. Participant is following exercise prescription and nutrition guidelines.;Long Term: Cholesterol controlled with  medications as prescribed, with individualized exercise RX and with personalized nutrition plan. Value goals: LDL < 70mg , HDL > 40 mg.    Stress  Yes    Intervention  Offer individual and/or small group education and counseling on adjustment to heart disease, stress management and health-related lifestyle change. Teach and support self-help strategies.;Refer participants experiencing significant psychosocial distress to appropriate mental health specialists for further evaluation and treatment. When possible, include family members and significant others in education/counseling sessions.    Expected Outcomes  Short Term: Participant demonstrates changes in health-related behavior, relaxation and other stress management skills, ability to obtain effective social support, and compliance with psychotropic medications if prescribed.;Long Term: Emotional wellbeing is indicated by absence of clinically significant psychosocial distress or social isolation.       Core Components/Risk Factors/Patient Goals Review:    Core Components/Risk Factors/Patient Goals at Discharge (Final Review):    ITP Comments: ITP Comments    Row Name 02/06/18 0833           ITP Comments  Dr. Fransico Him, Medical Director          Comments: Kensy attended orientation from [phone] to [phone] to review rules and guidelines for program. Completed 6 minute walk test, Intitial ITP, and exercise prescription.  VSS. Telemetry-Sinus Rhtyhm.  Asymptomatic.Barnet Pall, RN,BSN 02/06/2018 12:20 PM

## 2018-02-06 NOTE — Progress Notes (Signed)
Nancy [name] 52 y.o. female DOB: [birth year] MRN: [id number]      Nutrition Note  1. Status post coronary artery stent placement    Past Medical History:  Diagnosis Date  . Anxiety   . CAD (coronary artery disease)    1/19 PCI/DESx1 to LAD, and DESx2 to OM, normal EF via LV gram  . Hyperlipidemia   . Hypertension    Meds reviewed.  HT: Ht Readings from Last 1 Encounters:  02/06/18 5\' 3"  (1.6 m)    WT: Wt Readings from Last 3 Encounters:  02/06/18 134 lb 7.7 oz (61 kg)  01/08/18 133 lb 6.4 oz (60.5 kg)  12/28/17 133 lb (60.3 kg)     BMI 23.6   Current tobacco use? No   Labs:  Lipid Panel     Component Value Date/Time   CHOL 198 08/17/2016 0937   CHOL 147 02/13/2013 0902   TRIG 59 08/17/2016 0937   TRIG 72 02/13/2013 0902   HDL 64 08/17/2016 0937   HDL 43 02/13/2013 0902   CHOLHDL 3.1 08/17/2016 0937   VLDL 12 08/17/2016 0937   LDLCALC 122 08/17/2016 0937   LDLCALC 90 02/13/2013 0902    Lab Results  Component Value Date   HGBA1C 4.4 08/17/2016   CBG (last 3)  No results for input(s): GLUCAP in the last 72 hours.  Nutrition Note Spoke with pt. Nutrition plan and goals reviewed with pt. Pt is following Step 2 of the Therapeutic Lifestyle Changes diet according to [employer] survey results. Pt eats out for lunch daily. Pt expressed understanding of the information reviewed. Pt aware of nutrition education classes offered and s unable to attend nutrition classes due to work.  Nutrition Diagnosis ? Food-and nutrition-related knowledge deficit related to lack of exposure to information as related to diagnosis of: ? CVD  Nutrition Intervention ? Pt's individual nutrition plan and goals reviewed with pt. ? Pt given handouts for: ? Nutrition I class ? Nutrition II class   Nutrition Goal(s):  ? Pt to make good choices when eating out at restaurants.  Plan:  Pt to attend nutrition classes ? Nutrition I ? Nutrition II ? Portion Distortion  Will provide  client-centered nutrition education as part of interdisciplinary care.   Monitor and evaluate progress toward nutrition goal with team.  Derek Mound, M.Ed, RD, LDN, CDE 02/06/2018 10:58 AM

## 2018-02-10 ENCOUNTER — Encounter (HOSPITAL_COMMUNITY)
Admission: RE | Admit: 2018-02-10 | Discharge: 2018-02-10 | Disposition: A | Discharge status: Home / Self Care | Payer: 59 | Source: Ambulatory Visit | Attending: Cardiovascular Disease | Admitting: Cardiovascular Disease

## 2018-02-10 ENCOUNTER — Encounter (HOSPITAL_COMMUNITY): Payer: Self-pay

## 2018-02-10 DIAGNOSIS — Z955 Presence of coronary angioplasty implant and graft: Secondary | ICD-10-CM

## 2018-02-10 NOTE — Progress Notes (Signed)
Daily Session Note  Patient Details  Name: Nancy [name] MRN: [id number] Date of Birth: 1966-01-15 Referring Provider:     CARDIAC REHAB PHASE II ORIENTATION from 02/06/2018 in [place]  Referring Provider  Dwyane Dee MD      Encounter Date: 02/10/2018  Check In: Session Check In - 02/10/18 0645      Check-In   Location  [facility]    Staff Present  Trish Fountain, RN, BSN;Joann Rion, RN, BSN;Other;Tyara Carol Ada, MS,ACSM CEP, Exercise Physiologist    Supervising physician immediately available to respond to emergencies  [facility] immediately available    Physician(s)  Dr Horris Latino    Medication changes reported      No    Fall or balance concerns reported     No    Tobacco Cessation  No Change    Warm-up and Cool-down  Performed as group-led instruction    Resistance Training Performed  Yes    VAD Patient?  No      Pain Assessment   Currently in Pain?  No/denies    Multiple Pain Sites  No       Capillary Blood Glucose: No results found for this or any previous visit (from the past 24 hour(s)).    Social History   Tobacco Use  Smoking Status Never Smoker  Smokeless Tobacco Never Used    Goals Met:  Exercise tolerated well  Goals Unmet:  Not Applicable  Comments: Pt started cardiac rehab today.  Pt tolerated light exercise without difficulty. VSS, telemetry-sinus rhythm, asymptomatic.  Medication list reconciled. Pt denies barriers to medicaiton compliance.  PSYCHOSOCIAL ASSESSMENT:  PHQ-0. Marland Kitchen Pt exhibits positive coping skills, hopeful outlook with supportive family. No psychosocial needs identified at this time, no psychosocial interventions necessary.    Pt enjoys spending time with friends and going to the beach.    Pt oriented to exercise equipment and routine.    Understanding verbalized.   Dr. Fransico Him is Medical Director for Cardiac Rehab at [facility].

## 2018-02-12 ENCOUNTER — Encounter (HOSPITAL_COMMUNITY)
Admission: RE | Admit: 2018-02-12 | Discharge: 2018-02-12 | Disposition: A | Discharge status: Home / Self Care | Payer: 59 | Source: Ambulatory Visit | Attending: Cardiovascular Disease | Admitting: Cardiovascular Disease

## 2018-02-12 DIAGNOSIS — Z955 Presence of coronary angioplasty implant and graft: Secondary | ICD-10-CM

## 2018-02-14 ENCOUNTER — Encounter (HOSPITAL_COMMUNITY)
Admission: RE | Admit: 2018-02-14 | Discharge: 2018-02-14 | Disposition: A | Discharge status: Home / Self Care | Payer: 59 | Source: Ambulatory Visit | Attending: Cardiovascular Disease | Admitting: Cardiovascular Disease

## 2018-02-14 DIAGNOSIS — Z955 Presence of coronary angioplasty implant and graft: Secondary | ICD-10-CM | POA: Diagnosis not present

## 2018-02-17 ENCOUNTER — Encounter (HOSPITAL_COMMUNITY)
Admission: RE | Admit: 2018-02-17 | Discharge: 2018-02-17 | Disposition: A | Discharge status: Home / Self Care | Payer: 59 | Source: Ambulatory Visit | Attending: Cardiovascular Disease | Admitting: Cardiovascular Disease

## 2018-02-17 DIAGNOSIS — Z955 Presence of coronary angioplasty implant and graft: Secondary | ICD-10-CM

## 2018-02-19 ENCOUNTER — Encounter (HOSPITAL_COMMUNITY): Payer: Self-pay

## 2018-02-19 ENCOUNTER — Encounter (HOSPITAL_COMMUNITY)
Admission: RE | Admit: 2018-02-19 | Discharge: 2018-02-19 | Disposition: A | Discharge status: Home / Self Care | Payer: 59 | Source: Ambulatory Visit | Attending: Cardiovascular Disease | Admitting: Cardiovascular Disease

## 2018-02-19 DIAGNOSIS — Z955 Presence of coronary angioplasty implant and graft: Secondary | ICD-10-CM | POA: Diagnosis not present

## 2018-02-19 NOTE — Progress Notes (Signed)
Cardiac Individual Treatment Plan  Patient Details  Name: Nancy [name] MRN: [id number] Date of Birth: [birth year] Referring Provider:     CARDIAC REHAB PHASE II ORIENTATION from 02/06/2018 in [place]  Referring Provider  Croitour, Dani Gobble MD      Initial Encounter Date:    CARDIAC REHAB PHASE II ORIENTATION from 02/06/2018 in [place]  Date  02/06/18  Referring Provider  Croitour, Mihai MD      Visit Diagnosis: Status post coronary artery stent placement DES to LAD, OM 12/24/17  Patient's Home Medications on Admission:  Current Outpatient Medications:  .  aspirin EC 81 MG tablet, Take 1 tablet (81 mg total) by mouth daily., Disp: 90 tablet, Rfl: 3 .  atorvastatin (LIPITOR) 80 MG tablet, Take 1 tablet (80 mg total) by mouth daily., Disp: 30 tablet, Rfl: 2 .  carvedilol (COREG) 6.25 MG tablet, Take 1 tablet (6.25 mg total) by mouth 2 (two) times daily., Disp: 180 tablet, Rfl: 3 .  clopidogrel (PLAVIX) 75 MG tablet, Take 1 tablet (75 mg total) by mouth daily with breakfast., Disp: 90 tablet, Rfl: 2 .  losartan (COZAAR) 25 MG tablet, Take 0.5 tablets (12.5 mg total) by mouth daily., Disp: 45 tablet, Rfl: 3 .  nitroGLYCERIN (NITROSTAT) 0.4 MG SL tablet, Place 1 tablet (0.4 mg total) under the tongue every 5 (five) minutes as needed., Disp: 25 tablet, Rfl: 2 .  traZODone (DESYREL) 100 MG tablet, Take 1 tablet (100 mg total) by mouth at bedtime as needed for sleep., Disp: 90 tablet, Rfl: 1  Past Medical History: Past Medical History:  Diagnosis Date  . Anxiety   . CAD (coronary artery disease)    1/19 PCI/DESx1 to LAD, and DESx2 to OM, normal EF via LV gram  . Hyperlipidemia   . Hypertension     Tobacco Use: Social History   Tobacco Use  Smoking Status Never Smoker  Smokeless Tobacco Never Used    Labs: Recent Review Flowsheet Data    Labs for ITP Cardiac and Pulmonary Rehab Latest Ref Rng & Units 03/31/2015  08/17/2016 12/24/2017 12/24/2017 12/24/2017   Cholestrol 125 - 200 mg/dL 212(H) 198 - - -   LDLCALC <130 mg/dL 118(H) 122 - - -   HDL >=46 mg/dL 79 64 - - -   Trlycerides <150 mg/dL 75 59 - - -   Hemoglobin A1c <5.7 % - 4.4 - - -   PHART 7.350 - 7.450 - - 7.325(L) - -   PCO2ART 32.0 - 48.0 mmHg - - 44.1 - -   HCO3 20.0 - 28.0 mmol/L - - 23.0 22.7 22.4   TCO2 22 - 32 mmol/L - - 24 24 24    ACIDBASEDEF 0.0 - 2.0 mmol/L - - 3.0(H) 3.0(H) 3.0(H)   O2SAT % - - 99.0 76.0 77.0      Capillary Blood Glucose: No results found for: GLUCAP   Exercise Target Goals:    Exercise Program Goal: Individual exercise prescription set using results from initial 6 min walk test and THRR while considering  patient's activity barriers and safety.   Exercise Prescription Goal: Initial exercise prescription builds to 30-45 minutes a day of aerobic activity, 2-3 days per week.  Home exercise guidelines will be given to patient during program as part of exercise prescription that the participant will acknowledge.  Activity Barriers & Risk Stratification: Activity Barriers & Cardiac Risk Stratification - 02/06/18 0838      Activity Barriers &  Cardiac Risk Stratification   Activity Barriers  None    Cardiac Risk Stratification  Moderate       6 Minute Walk: 6 Minute Walk    Row Name 02/06/18 1019         6 Minute Walk   Phase  Initial     Distance  1628 feet     Walk Time  6 minutes     # of Rest Breaks  0     MPH  3     METS  4.5     RPE  8     Perceived Dyspnea   0     VO2 Peak  15.8     Symptoms  No     Resting HR  79 bpm     Resting BP  98/62     Resting Oxygen Saturation   100 %     Exercise Oxygen Saturation  during 6 min walk  100 %     Max Ex. HR  99 bpm     Max Ex. BP  126/70     2 Minute Post BP  118/68        Oxygen Initial Assessment:   Oxygen Re-Evaluation:   Oxygen Discharge (Final Oxygen Re-Evaluation):   Initial Exercise Prescription: Initial Exercise  Prescription - 02/06/18 1000      Date of Initial Exercise RX and Referring Provider   Date  02/06/18    Referring Provider  Croitour, Mihai MD      Treadmill   MPH  2.7    Grade  1    Minutes  10    METs  3.44      Bike   Level  2.5    Minutes  10    METs  3      NuStep   Level  3    SPM  75    Minutes  10    METs  3      Prescription Details   Frequency (times per week)  x    Duration  Progress to 45 minutes of aerobic exercise without signs/symptoms of physical distress      Intensity   THRR 40-80% of Max Heartrate  67-134    Ratings of Perceived Exertion  11-13    Perceived Dyspnea  0-4      Progression   Progression  Continue progressive overload as per policy without signs/symptoms or physical distress.      Resistance Training   Training Prescription  Yes    Weight  3lbs    Reps  10-15       Perform Capillary Blood Glucose checks as needed.  Exercise Prescription Changes: Exercise Prescription Changes    Row Name 02/10/18 1000 02/17/18 0800           Response to Exercise   Blood Pressure (Admit)  116/70  122/80      Blood Pressure (Exercise)  138/72  126/82      Blood Pressure (Exit)  130/80  120/80      Heart Rate (Admit)  95 bpm  85 bpm      Heart Rate (Exercise)  112 bpm  109 bpm      Heart Rate (Exit)  90 bpm  85 bpm      Rating of Perceived Exertion (Exercise)  12  12      Perceived Dyspnea (Exercise)  0  -      Symptoms  None   -  Comments  Pt oriented to exericse equipment   -      Duration  Progress to 30 minutes of  aerobic without signs/symptoms of physical distress  -      Intensity  THRR New  THRR unchanged        Progression   Progression  Continue to progress workloads to maintain intensity without signs/symptoms of physical distress.  Continue to progress workloads to maintain intensity without signs/symptoms of physical distress.      Average METs  2.68  4.1        Resistance Training   Training Prescription  Yes  Yes       Weight  3lbs  5lbs      Reps  10-15  10-15      Time  10 Minutes  10 Minutes        Interval Training   Interval Training  No  No        Treadmill   MPH  2.7  3      Grade  1  4      Minutes  10  10      METs  3.44  4.95        Bike   Level  2.5  3      Minutes  10  10      METs  2  4.06        NuStep   Level  3  4      SPM  80  80      Minutes  10  10      METs  2.6  3.3         Exercise Comments: Exercise Comments    Row Name 02/10/18 1018 02/17/18 0859         Exercise Comments  Pt had a good start to exericse today. Pt was oriented to exericse equipment. WIll continue to follow pt.   Pt is responding well to exercise prescriptions and workload increases early in program. Pt is receptive to exericse despite being a little hesistant prior to starting program. Will conitnue to work with and progress pt.          Exercise Goals and Review: Exercise Goals    Row Name 02/06/18 [phone]             Exercise Goals   Increase Physical Activity  Yes       Intervention  Provide advice, education, support and counseling about physical activity/exercise needs.;Develop an individualized exercise prescription for aerobic and resistive training based on initial evaluation findings, risk stratification, comorbidities and participant's personal goals.       Expected Outcomes  Short Term: Attend rehab on a regular basis to increase amount of physical activity.;Long Term: Add in home exercise to make exercise part of routine and to increase amount of physical activity.;Long Term: Exercising regularly at least 3-5 days a week.       Increase Strength and Stamina  Yes       Intervention  Provide advice, education, support and counseling about physical activity/exercise needs.;Develop an individualized exercise prescription for aerobic and resistive training based on initial evaluation findings, risk stratification, comorbidities and participant's personal goals.       Expected Outcomes   Short Term: Increase workloads from initial exercise prescription for resistance, speed, and METs.;Short Term: Perform resistance training exercises routinely during rehab and add in resistance training at home;Long Term: Improve cardiorespiratory fitness, muscular endurance and strength  as measured by increased METs and functional capacity (6MWT)       Able to understand and use rate of perceived exertion (RPE) scale  Yes       Intervention  Provide education and explanation on how to use RPE scale       Expected Outcomes  Short Term: Able to use RPE daily in rehab to express subjective intensity level;Long Term:  Able to use RPE to guide intensity level when exercising independently       Knowledge and understanding of Target Heart Rate Range (THRR)  Yes       Intervention  Provide education and explanation of THRR including how the numbers were predicted and where they are located for reference       Expected Outcomes  Short Term: Able to state/look up THRR;Short Term: Able to use daily as guideline for intensity in rehab;Long Term: Able to use THRR to govern intensity when exercising independently       Able to check pulse independently  Yes       Intervention  Provide education and demonstration on how to check pulse in carotid and radial arteries.;Review the importance of being able to check your own pulse for safety during independent exercise       Expected Outcomes  Short Term: Able to explain why pulse checking is important during independent exercise;Long Term: Able to check pulse independently and accurately       Understanding of Exercise Prescription  Yes       Intervention  Provide education, explanation, and written materials on patient's individual exercise prescription       Expected Outcomes  Short Term: Able to explain program exercise prescription;Long Term: Able to explain home exercise prescription to exercise independently          Exercise Goals Re-Evaluation : Exercise  Goals Re-Evaluation    Row Name 02/17/18 0901             Exercise Goal Re-Evaluation   Exercise Goals Review  Increase Physical Activity;Understanding of Exercise Prescription;Increase Strength and Stamina;Able to understand and use rate of perceived exertion (RPE) scale;Knowledge and understanding of Target Heart Rate Range (THRR);Able to check pulse independently       Comments  Pt is off to a great start with exercise. Pt has already increased workloads on all 3 stations. Pt is now working at a level 4 on [employer], Level 3 on [employer] and walking 34mph with a 4% incline on treadmill. Will continue to progress pt.         Expected Outcomes  Pt will continue to increase cardiorespiratory fitness. Will follow up with pt about Home Exericse Program and increasing her physical activity while away from rehab.           Discharge Exercise Prescription (Final Exercise Prescription Changes): Exercise Prescription Changes - 02/17/18 0800      Response to Exercise   Blood Pressure (Admit)  122/80    Blood Pressure (Exercise)  126/82    Blood Pressure (Exit)  120/80    Heart Rate (Admit)  85 bpm    Heart Rate (Exercise)  109 bpm    Heart Rate (Exit)  85 bpm    Rating of Perceived Exertion (Exercise)  12    Intensity  THRR unchanged      Progression   Progression  Continue to progress workloads to maintain intensity without signs/symptoms of physical distress.    Average METs  4.1  Resistance Training   Training Prescription  Yes    Weight  5lbs    Reps  10-15    Time  10 Minutes      Interval Training   Interval Training  No      Treadmill   MPH  3    Grade  4    Minutes  10    METs  4.95      Bike   Level  3    Minutes  10    METs  4.06      NuStep   Level  4    SPM  80    Minutes  10    METs  3.3       Nutrition:  Target Goals: Understanding of nutrition guidelines, daily intake of sodium 1500mg , cholesterol 200mg , calories 30% from fat and 7% or less from  saturated fats, daily to have 5 or more servings of fruits and vegetables.  Biometrics: Pre Biometrics - 02/06/18 1022      Pre Biometrics   Height  5\' 3"  (1.6 m)    Weight  134 lb 7.7 oz (61 kg)    Waist Circumference  29 inches    Hip Circumference  36.5 inches    Waist to Hip Ratio  0.79 %    BMI (Calculated)  23.83    Triceps Skinfold  22 mm    % Body Fat  32.5 %    Grip Strength  37 kg    Flexibility  17 in    Single Leg Stand  30 seconds        Nutrition Therapy Plan and Nutrition Goals: Nutrition Therapy & Goals - 02/06/18 1105      Nutrition Therapy   Diet  Heart Healthy      Personal Nutrition Goals   Nutrition Goal  Pt to make good choices when eating out at restaurants.      Intervention Plan   Intervention  Prescribe, educate and counsel regarding individualized specific dietary modifications aiming towards targeted core components such as weight, hypertension, lipid management, diabetes, heart failure and other comorbidities.    Expected Outcomes  Short Term Goal: Understand basic principles of dietary content, such as calories, fat, sodium, cholesterol and nutrients.;Long Term Goal: Adherence to prescribed nutrition plan.       Nutrition Assessments: Nutrition Assessments - 02/06/18 1105      MEDFICTS Scores   Pre Score  36       Nutrition Goals Re-Evaluation:   Nutrition Goals Re-Evaluation:   Nutrition Goals Discharge (Final Nutrition Goals Re-Evaluation):   Psychosocial: Target Goals: Acknowledge presence or absence of significant depression and/or stress, maximize coping skills, provide positive support system. Participant is able to verbalize types and ability to use techniques and skills needed for reducing stress and depression.  Initial Review & Psychosocial Screening: Initial Psych Review & Screening - 02/11/18 0800      Family Dynamics   Good Support System?  -- husband, children, family       Quality of Life Scores: Quality of  Life - 02/11/18 0759      Quality of Life Scores   Health/Function Pre  24.8 %    Socioeconomic Pre  24.75 %    Psych/Spiritual Pre  26.57 %    Family Pre  24.7 %    GLOBAL Pre  25.13 %      Scores of 19 and below usually indicate a poorer quality of life in these areas.  A difference of  2-3 points is a clinically meaningful difference.  A difference of 2-3 points in the total score of the Quality of Life Index has been associated with significant improvement in overall quality of life, self-image, physical symptoms, and general health in studies assessing change in quality of life.  PHQ-9: Recent Review Flowsheet Data    Depression screen [facility] 2/9 02/10/2018 12/28/2017 08/08/2017 08/17/2016 01/24/2016   Decreased Interest 0 0 0 0 0   Down, Depressed, Hopeless 0 0 0 0 0   PHQ - 2 Score 0 0 0 0 0     Interpretation of Total Score  Total Score Depression Severity:  1-4 = Minimal depression, 5-9 = Mild depression, 10-14 = Moderate depression, 15-19 = Moderately severe depression, 20-27 = Severe depression   Psychosocial Evaluation and Intervention: Psychosocial Evaluation - 02/10/18 0738      Psychosocial Evaluation & Interventions   Interventions  Encouraged to exercise with the program and follow exercise prescription;Stress management education;Relaxation education    Comments  pt with stress concerns.  pt enjoys spending time with friends and going to the beach.      Expected Outcomes  pt will exhibit positive outlook with improved coping skills.     Continue Psychosocial Services   Follow up required by staff       Psychosocial Re-Evaluation: Psychosocial Re-Evaluation    [place] Name 02/14/18 0803             Psychosocial Re-Evaluation   Current issues with  Current Stress Concerns       Comments  pt with high stress profession as Surveyor, mining.  Otherwise no psychosocial needs identifed, no interventions necessary        Expected Outcomes  pt will exhibit positive  outlook with good coping skills.        Interventions  Encouraged to attend Cardiac Rehabilitation for the exercise         Initial Review   Source of Stress Concerns  Occupation          Psychosocial Discharge (Final Psychosocial Re-Evaluation): Psychosocial Re-Evaluation - 02/14/18 0803      Psychosocial Re-Evaluation   Current issues with  Current Stress Concerns    Comments  pt with high stress profession as Surveyor, mining.  Otherwise no psychosocial needs identifed, no interventions necessary     Expected Outcomes  pt will exhibit positive outlook with good coping skills.     Interventions  Encouraged to attend Cardiac Rehabilitation for the exercise      Initial Review   Source of Stress Concerns  Occupation       Vocational Rehabilitation: Provide vocational rehab assistance to qualifying candidates.   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 Participant will state understanding/return demonstration of topics presented.  Learning Barriers/Preferences: Learning Barriers/Preferences - 02/06/18 1209      Learning Barriers/Preferences   Learning Barriers  None    Learning Preferences  Video;Written Material;Pictoral       Education Topics: Count Your Pulse:  -Group instruction provided by verbal instruction, demonstration, patient participation and written materials to support subject.  Instructors address importance of  being able to find your pulse and how to count your pulse when at home without a heart monitor.  Patients get hands on experience counting their pulse with staff help and individually.   Heart Attack, Angina,  and Risk Factor Modification:  -Group instruction provided by verbal instruction, video, and written materials to support subject.  Instructors address signs and symptoms of angina and heart attacks.    Also discuss risk factors for heart disease and how to make changes to improve heart health risk factors.   Functional Fitness:  -Group instruction provided by verbal instruction, demonstration, patient participation, and written materials to support subject.  Instructors address safety measures for doing things around the house.  Discuss how to get up and down off the floor, how to pick things up properly, how to safely get out of a chair without assistance, and balance training.   Meditation and Mindfulness:  -Group instruction provided by verbal instruction, patient participation, and written materials to support subject.  Instructor addresses importance of mindfulness and meditation practice to help reduce stress and improve awareness.  Instructor also leads participants through a meditation exercise.    Stretching for Flexibility and Mobility:  -Group instruction provided by verbal instruction, patient participation, and written materials to support subject.  Instructors lead participants through series of stretches that are designed to increase flexibility thus improving mobility.  These stretches are additional exercise for major muscle groups that are typically performed during regular warm up and cool down.   Hands Only CPR:  -Group verbal, video, and participation provides a basic overview of AHA guidelines for community CPR. Role-play of emergencies allow participants the opportunity to practice calling for help and chest compression technique with discussion of AED use.   Hypertension: -Group verbal and written instruction that provides a basic overview of hypertension including the most recent diagnostic guidelines, risk factor reduction with self-care instructions and medication  management.    Nutrition I class: Heart Healthy Eating:  -Group instruction provided by PowerPoint slides, verbal discussion, and written materials to support subject matter. The instructor gives an explanation and review of the Therapeutic Lifestyle Changes diet recommendations, which includes a discussion on lipid goals, dietary fat, sodium, fiber, plant stanol/sterol esters, sugar, and the components of a well-balanced, healthy diet.   Nutrition II class: Lifestyle Skills:  -Group instruction provided by PowerPoint slides, verbal discussion, and written materials to support subject matter. The instructor gives an explanation and review of label reading, grocery shopping for heart health, heart healthy recipe modifications, and ways to make healthier choices when eating out.   Diabetes Question & Answer:  -Group instruction provided by PowerPoint slides, verbal discussion, and written materials to support subject matter. The instructor gives an explanation and review of diabetes co-morbidities, pre- and post-prandial blood glucose goals, pre-exercise blood glucose goals, signs, symptoms, and treatment of hypoglycemia and hyperglycemia, and foot care basics.   Diabetes Blitz:  -Group instruction provided by PowerPoint slides, verbal discussion, and written materials to support subject matter. The instructor gives an explanation and review of the physiology behind type 1 and type 2 diabetes, diabetes medications and rational behind using different medications, pre- and post-prandial blood glucose recommendations and Hemoglobin A1c goals, diabetes diet, and exercise including blood glucose guidelines for exercising safely.    Portion Distortion:  -Group instruction provided by PowerPoint slides, verbal discussion, written materials, and food models to support subject matter. The instructor gives an explanation of serving size versus portion size, changes in portions sizes  over the last 20 years,  and what consists of a serving from each food group.   Stress Management:  -Group instruction provided by verbal instruction, video, and written materials to support subject matter.  Instructors review role of stress in heart disease and how to cope with stress positively.     Exercising on Your Own:  -Group instruction provided by verbal instruction, power point, and written materials to support subject.  Instructors discuss benefits of exercise, components of exercise, frequency and intensity of exercise, and end points for exercise.  Also discuss use of nitroglycerin and activating EMS.  Review options of places to exercise outside of rehab.  Review guidelines for sex with heart disease.   Cardiac Drugs I:  -Group instruction provided by verbal instruction and written materials to support subject.  Instructor reviews cardiac drug classes: antiplatelets, anticoagulants, beta blockers, and statins.  Instructor discusses reasons, side effects, and lifestyle considerations for each drug class.   Cardiac Drugs II:  -Group instruction provided by verbal instruction and written materials to support subject.  Instructor reviews cardiac drug classes: angiotensin converting enzyme inhibitors (ACE-I), angiotensin II receptor blockers (ARBs), nitrates, and calcium channel blockers.  Instructor discusses reasons, side effects, and lifestyle considerations for each drug class.   Anatomy and Physiology of the Circulatory System:  Group verbal and written instruction and models provide basic cardiac anatomy and physiology, with the coronary electrical and arterial systems. Review of: AMI, Angina, Valve disease, Heart Failure, Peripheral Artery Disease, Cardiac Arrhythmia, Pacemakers, and the ICD.   Other Education:  -Group or individual verbal, written, or video instructions that support the educational goals of the cardiac rehab program.   Holiday Eating Survival Tips:  -Group instruction provided by  PowerPoint slides, verbal discussion, and written materials to support subject matter. The instructor gives patients tips, tricks, and techniques to help them not only survive but enjoy the holidays despite the onslaught of food that accompanies the holidays.   Knowledge Questionnaire Score: Knowledge Questionnaire Score - 02/11/18 0759      Knowledge Questionnaire Score   Pre Score  22/24       Core Components/Risk Factors/Patient Goals at Admission: Personal Goals and Risk Factors at Admission - 02/06/18 1219      Core Components/Risk Factors/Patient Goals on Admission    Weight Management  Weight Maintenance;Yes    Intervention  Weight Management: Develop a combined nutrition and exercise program designed to reach desired caloric intake, while maintaining appropriate intake of nutrient and fiber, sodium and fats, and appropriate energy expenditure required for the weight goal.;Weight Management: Provide education and appropriate resources to help participant work on and attain dietary goals.;Weight Management/Obesity: Establish reasonable short term and long term weight goals.    Admit Weight  134 lb 7.7 oz (61 kg)    Goal Weight: Long Term  130 lb (59 kg)    Expected Outcomes  Short Term: Continue to assess and modify interventions until short term weight is achieved;Long Term: Adherence to nutrition and physical activity/exercise program aimed toward attainment of established weight goal;Weight Maintenance: Understanding of the daily nutrition guidelines, which includes 25-35% calories from fat, 7% or less cal from saturated fats, less than 200mg  cholesterol, less than 1.5gm of sodium, & 5 or more servings of fruits and vegetables daily;Understanding recommendations for meals to include 15-35% energy as protein, 25-35% energy from fat, 35-60% energy from carbohydrates, less than 200mg  of dietary cholesterol, 20-35 gm of total fiber daily;Understanding of distribution of calorie intake  throughout  the day with the consumption of 4-5 meals/snacks    Hypertension  Yes    Intervention  Provide education on lifestyle modifcations including regular physical activity/exercise, weight management, moderate sodium restriction and increased consumption of fresh fruit, vegetables, and low fat dairy, alcohol moderation, and smoking cessation.;Monitor prescription use compliance.    Expected Outcomes  Short Term: Continued assessment and intervention until BP is < 140/41mm HG in hypertensive participants. < 130/30mm HG in hypertensive participants with diabetes, heart failure or chronic kidney disease.;Long Term: Maintenance of blood pressure at goal levels.    Lipids  Yes    Intervention  Provide education and support for participant on nutrition & aerobic/resistive exercise along with prescribed medications to achieve LDL 70mg , HDL >40mg .    Expected Outcomes  Short Term: Participant states understanding of desired cholesterol values and is compliant with medications prescribed. Participant is following exercise prescription and nutrition guidelines.;Long Term: Cholesterol controlled with medications as prescribed, with individualized exercise RX and with personalized nutrition plan. Value goals: LDL < 70mg , HDL > 40 mg.    Stress  Yes    Intervention  Offer individual and/or small group education and counseling on adjustment to heart disease, stress management and health-related lifestyle change. Teach and support self-help strategies.;Refer participants experiencing significant psychosocial distress to appropriate mental health specialists for further evaluation and treatment. When possible, include family members and significant others in education/counseling sessions.    Expected Outcomes  Short Term: Participant demonstrates changes in health-related behavior, relaxation and other stress management skills, ability to obtain effective social support, and compliance with psychotropic medications  if prescribed.;Long Term: Emotional wellbeing is indicated by absence of clinically significant psychosocial distress or social isolation.       Core Components/Risk Factors/Patient Goals Review:  Goals and Risk Factor Review    Row Name 02/10/18 0736 02/14/18 0801           Core Components/Risk Factors/Patient Goals Review   Personal Goals Review  Stress;Hypertension;Lipids;Other  Stress;Hypertension;Lipids;Other      Review  pt with multiple CAD RF demonstrates eagerness to participate in CR activities.  pt personal goals are to maintain(not lose) weight and learn heart healthy eating habits.  pt encouraged to participate in nutrition education opportunities.    pt with multiple CAD RF demonstrates eagerness to participate in CR activities.  pt personal goals are to maintain(not lose) weight and learn heart healthy eating habits.  pt encouraged to participate in nutrition education opportunities.        Expected Outcomes  pt will participate in CR exercise, nutrition and lifestyle modification opportunities.    pt will participate in CR exercise, nutrition and lifestyle modification opportunities.           Core Components/Risk Factors/Patient Goals at Discharge (Final Review):  Goals and Risk Factor Review - 02/14/18 0801      Core Components/Risk Factors/Patient Goals Review   Personal Goals Review  Stress;Hypertension;Lipids;Other    Review  pt with multiple CAD RF demonstrates eagerness to participate in CR activities.  pt personal goals are to maintain(not lose) weight and learn heart healthy eating habits.  pt encouraged to participate in nutrition education opportunities.      Expected Outcomes  pt will participate in CR exercise, nutrition and lifestyle modification opportunities.         ITP Comments: ITP Comments    Row Name 02/06/18 [phone] 02/10/18 0732 02/14/18 0801       ITP Comments  Dr. Fransico Him, Medical Director  pt started group exercise.  pt tolerated light  activity without difficulty.   30 day ITP review.  pt demonstrates willingess to participate in CR program         Comments:

## 2018-02-21 ENCOUNTER — Encounter (HOSPITAL_COMMUNITY): Payer: Self-pay

## 2018-02-21 ENCOUNTER — Encounter (HOSPITAL_COMMUNITY)
Admission: RE | Admit: 2018-02-21 | Discharge: 2018-02-21 | Disposition: A | Discharge status: Home / Self Care | Payer: 59 | Source: Ambulatory Visit | Attending: Cardiovascular Disease | Admitting: Cardiovascular Disease

## 2018-02-21 DIAGNOSIS — Z955 Presence of coronary angioplasty implant and graft: Secondary | ICD-10-CM

## 2018-02-21 NOTE — Progress Notes (Signed)
Reviewed home exercise guidelines with patient including endpoints, temperature precautions, target heart rate and rate of perceived exertion. Pt plans to walk and use treadmill as her mode of home exercise. Pt walks 60 minutes at least 3 days/week. Pt voices understanding of instructions given. Nancy Passer, MS, ACSM CEP

## 2018-02-21 NOTE — Progress Notes (Signed)
Nancy [name] 52 y.o. female DOB: 06/22/1966 MRN: [id number]      Nutrition Note  1. Status post coronary artery stent placement DES to LAD, OM 12/24/17    Nutrition Note Spoke with pt. Nutrition plan and survey reviewed with pt. Pt is following a Heart Healthy diet. Pt expressed understanding of the information reviewed. Pt aware of nutrition education classes offered.  Nutrition Diagnosis ? Food-and nutrition-related knowledge deficit related to lack of exposure to information as related to diagnosis of: ? CVD  Nutrition Intervention ? Pt's individual nutrition plan reviewed with pt. ? Benefits of adopting Heart Healthy diet discussed when [employer] reviewed.   ? Pt given handouts for: ? Nutrition class schedule  Nutrition Goal(s):  ? Pt to make good choices when eating out at restaurants.  Plan:  Pt to attend nutrition classes ? Nutrition I ? Nutrition II ? Portion Distortion  Will provide client-centered nutrition education as part of interdisciplinary care.   Monitor and evaluate progress toward nutrition goal with team.  Derek Mound, M.Ed, RD, LDN, CDE 02/21/2018 8:20 AM

## 2018-02-24 ENCOUNTER — Encounter (HOSPITAL_COMMUNITY): Payer: 59

## 2018-02-26 ENCOUNTER — Encounter (HOSPITAL_COMMUNITY): Payer: 59

## 2018-02-28 ENCOUNTER — Encounter (HOSPITAL_COMMUNITY): Payer: 59

## 2018-03-03 ENCOUNTER — Encounter (HOSPITAL_COMMUNITY): Payer: 59

## 2018-03-05 ENCOUNTER — Encounter (HOSPITAL_COMMUNITY): Payer: 59

## 2018-03-06 NOTE — Progress Notes (Signed)
Discharge Progress Report  Patient Details  Name: Nancy [name] MRN: [id number] Date of Birth: 01-02-1966 Referring Provider:     CARDIAC REHAB PHASE II ORIENTATION from 02/06/2018 in [facility]  Referring Provider  Croitour, Mihai MD       Number of Visits: 7  Reason for Discharge:  Early Exit:  Pt feels comfortable and confident in her exercise routine to exercise at home.   Smoking History:  Social History   Tobacco Use  Smoking Status Never Smoker  Smokeless Tobacco Never Used    Diagnosis:  Status post coronary artery stent placement DES to LAD, OM 12/24/17  ADL [facility]SD:   Initial Exercise Prescription: Initial Exercise Prescription - 02/06/18 1000      Date of Initial Exercise RX and Referring Provider   Date  02/06/18    Referring Provider  Croitour, Mihai MD      Treadmill   MPH  2.7    Grade  1    Minutes  10    METs  3.44      Bike   Level  2.5    Minutes  10    METs  3      NuStep   Level  3    SPM  75    Minutes  10    METs  3      Prescription Details   Frequency (times per week)  x    Duration  Progress to 45 minutes of aerobic exercise without signs/symptoms of physical distress      Intensity   THRR 40-80% of Max Heartrate  67-134    Ratings of Perceived Exertion  11-13    Perceived Dyspnea  0-4      Progression   Progression  Continue progressive overload as per policy without signs/symptoms or physical distress.      Resistance Training   Training Prescription  Yes    Weight  3lbs    Reps  10-15       Discharge Exercise Prescription (Final Exercise Prescription Changes): Exercise Prescription Changes - 02/21/18 1413      Response to Exercise   Blood Pressure (Admit)  124/70    Blood Pressure (Exercise)  162/86    Blood Pressure (Exit)  116/82    Heart Rate (Admit)  76 bpm    Heart Rate (Exercise)  120 bpm    Heart Rate (Exit)  71 bpm    Rating of Perceived Exertion (Exercise)  12    Perceived Dyspnea (Exercise)  0    Symptoms  None    Duration  Continue with 30 min of aerobic exercise without signs/symptoms of physical distress.    Intensity  THRR unchanged      Progression   Progression  Continue to progress workloads to maintain intensity without signs/symptoms of physical distress.    Average METs  4.5      Resistance Training   Training Prescription  Yes    Weight  5lbs    Reps  10-15    Time  10 Minutes      Interval Training   Interval Training  No      Treadmill   MPH  3    Grade  4    Minutes  10    METs  4.95      Bike   Level  3    Minutes  10    METs  4      NuStep  Level  4    SPM  85    Minutes  10    METs  4.1      Home Exercise Plan   Plans to continue exercise at  Home (comment)    Frequency  Add 4 additional days to program exercise sessions.    Initial Home Exercises Provided  02/21/18       Functional Capacity: 6 Minute Walk    Row Name 02/06/18 1019         6 Minute Walk   Phase  Initial     Distance  1628 feet     Walk Time  6 minutes     # of Rest Breaks  0     MPH  3     METS  4.5     RPE  8     Perceived Dyspnea   0     VO2 Peak  15.8     Symptoms  No     Resting HR  79 bpm     Resting BP  98/62     Resting Oxygen Saturation   100 %     Exercise Oxygen Saturation  during 6 min walk  100 %     Max Ex. HR  99 bpm     Max Ex. BP  126/70     2 Minute Post BP  118/68        Psychological, QOL, Others - Outcomes: PHQ 2/9: Depression screen [facility] 2/9 02/10/2018 12/28/2017 08/08/2017 08/17/2016 01/24/2016  Decreased Interest 0 0 0 0 0  Down, Depressed, Hopeless 0 0 0 0 0  PHQ - 2 Score 0 0 0 0 0    Quality of Life: Quality of Life - 02/11/18 0759      Quality of Life Scores   Health/Function Pre  24.8 %    Socioeconomic Pre  24.75 %    Psych/Spiritual Pre  26.57 %    Family Pre  24.7 %    GLOBAL Pre  25.13 %       Personal Goals: Goals established at orientation with interventions provided to work  toward goal. Personal Goals and Risk Factors at Admission - 02/06/18 1219      Core Components/Risk Factors/Patient Goals on Admission    Weight Management  Weight Maintenance;Yes    Intervention  Weight Management: Develop a combined nutrition and exercise program designed to reach desired caloric intake, while maintaining appropriate intake of nutrient and fiber, sodium and fats, and appropriate energy expenditure required for the weight goal.;Weight Management: Provide education and appropriate resources to help participant work on and attain dietary goals.;Weight Management/Obesity: Establish reasonable short term and long term weight goals.    Admit Weight  134 lb 7.7 oz (61 kg)    Goal Weight: Long Term  130 lb (59 kg)    Expected Outcomes  Short Term: Continue to assess and modify interventions until short term weight is achieved;Long Term: Adherence to nutrition and physical activity/exercise program aimed toward attainment of established weight goal;Weight Maintenance: Understanding of the daily nutrition guidelines, which includes 25-35% calories from fat, 7% or less cal from saturated fats, less than 271m cholesterol, less than 1.5gm of sodium, & 5 or more servings of fruits and vegetables daily;Understanding recommendations for meals to include 15-35% energy as protein, 25-35% energy from fat, 35-60% energy from carbohydrates, less than 2042mof dietary cholesterol, 20-35 gm of total fiber daily;Understanding of distribution of calorie intake throughout the day with the  consumption of 4-5 meals/snacks    Hypertension  Yes    Intervention  Provide education on lifestyle modifcations including regular physical activity/exercise, weight management, moderate sodium restriction and increased consumption of fresh fruit, vegetables, and low fat dairy, alcohol moderation, and smoking cessation.;Monitor prescription use compliance.    Expected Outcomes  Short Term: Continued assessment and  intervention until BP is < 140/35m HG in hypertensive participants. < 130/857mHG in hypertensive participants with diabetes, heart failure or chronic kidney disease.;Long Term: Maintenance of blood pressure at goal levels.    Lipids  Yes    Intervention  Provide education and support for participant on nutrition & aerobic/resistive exercise along with prescribed medications to achieve LDL <7058mHDL >8m67m  Expected Outcomes  Short Term: Participant states understanding of desired cholesterol values and is compliant with medications prescribed. Participant is following exercise prescription and nutrition guidelines.;Long Term: Cholesterol controlled with medications as prescribed, with individualized exercise RX and with personalized nutrition plan. Value goals: LDL < 70mg33mL > 40 mg.    Stress  Yes    Intervention  Offer individual and/or small group education and counseling on adjustment to heart disease, stress management and health-related lifestyle change. Teach and support self-help strategies.;Refer participants experiencing significant psychosocial distress to appropriate mental health specialists for further evaluation and treatment. When possible, include family members and significant others in education/counseling sessions.    Expected Outcomes  Short Term: Participant demonstrates changes in health-related behavior, relaxation and other stress management skills, ability to obtain effective social support, and compliance with psychotropic medications if prescribed.;Long Term: Emotional wellbeing is indicated by absence of clinically significant psychosocial distress or social isolation.        Personal Goals Discharge: Goals and Risk Factor Review    Row Name 02/10/18 0736 02/14/18 0801 02/21/18 1103         Core Components/Risk Factors/Patient Goals Review   Personal Goals Review  Stress;Hypertension;Lipids;Other  Stress;Hypertension;Lipids;Other  Stress;Hypertension;Lipids;Other      Review  pt with multiple CAD RF demonstrates eagerness to participate in CR activities.  pt personal goals are to maintain(not lose) weight and learn heart healthy eating habits.  pt encouraged to participate in nutrition education opportunities.    pt with multiple CAD RF demonstrates eagerness to participate in CR activities.  pt personal goals are to maintain(not lose) weight and learn heart healthy eating habits.  pt encouraged to participate in nutrition education opportunities.    pt exited program early to exercise on her own at home.  pt feels comfortable and confident with her exercise. pt is pleased she has gained consistency in exercise and heart healthy nutrition.       Expected Outcomes  pt will participate in CR exercise, nutrition and lifestyle modification opportunities.    pt will participate in CR exercise, nutrition and lifestyle modification opportunities.    pt will participate in exercise, nutrition and lifestyle modification opportunities.          Exercise Goals and Review: Exercise Goals    Row Name 02/06/18 0838 [phone]        Exercise Goals   Increase Physical Activity  Yes       Intervention  Provide advice, education, support and counseling about physical activity/exercise needs.;Develop an individualized exercise prescription for aerobic and resistive training based on initial evaluation findings, risk stratification, comorbidities and participant's personal goals.       Expected Outcomes  Short Term: Attend rehab on a regular  basis to increase amount of physical activity.;Long Term: Add in home exercise to make exercise part of routine and to increase amount of physical activity.;Long Term: Exercising regularly at least 3-5 days a week.       Increase Strength and Stamina  Yes       Intervention  Provide advice, education, support and counseling about physical activity/exercise needs.;Develop an individualized exercise prescription for aerobic and resistive training  based on initial evaluation findings, risk stratification, comorbidities and participant's personal goals.       Expected Outcomes  Short Term: Increase workloads from initial exercise prescription for resistance, speed, and METs.;Short Term: Perform resistance training exercises routinely during rehab and add in resistance training at home;Long Term: Improve cardiorespiratory fitness, muscular endurance and strength as measured by increased METs and functional capacity (6MWT)       Able to understand and use rate of perceived exertion (RPE) scale  Yes       Intervention  Provide education and explanation on how to use RPE scale       Expected Outcomes  Short Term: Able to use RPE daily in rehab to express subjective intensity level;Long Term:  Able to use RPE to guide intensity level when exercising independently       Knowledge and understanding of Target Heart Rate Range (THRR)  Yes       Intervention  Provide education and explanation of THRR including how the numbers were predicted and where they are located for reference       Expected Outcomes  Short Term: Able to state/look up THRR;Short Term: Able to use daily as guideline for intensity in rehab;Long Term: Able to use THRR to govern intensity when exercising independently       Able to check pulse independently  Yes       Intervention  Provide education and demonstration on how to check pulse in carotid and radial arteries.;Review the importance of being able to check your own pulse for safety during independent exercise       Expected Outcomes  Short Term: Able to explain why pulse checking is important during independent exercise;Long Term: Able to check pulse independently and accurately       Understanding of Exercise Prescription  Yes       Intervention  Provide education, explanation, and written materials on patient's individual exercise prescription       Expected Outcomes  Short Term: Able to explain program exercise prescription;Long  Term: Able to explain home exercise prescription to exercise independently          Nutrition & Weight - Outcomes: Pre Biometrics - 02/06/18 1022      Pre Biometrics   Height  '5\' 3"'  (1.6 m)    Weight  134 lb 7.7 oz (61 kg)    Waist Circumference  29 inches    Hip Circumference  36.5 inches    Waist to Hip Ratio  0.79 %    BMI (Calculated)  23.83    Triceps Skinfold  22 mm    % Body Fat  32.5 %    Grip Strength  37 kg    Flexibility  17 in    Single Leg Stand  30 seconds        Nutrition: Nutrition Therapy & Goals - 02/06/18 1105      Nutrition Therapy   Diet  Heart Healthy      Personal Nutrition Goals   Nutrition Goal  Pt to make good choices when  eating out at restaurants.      Intervention Plan   Intervention  Prescribe, educate and counsel regarding individualized specific dietary modifications aiming towards targeted core components such as weight, hypertension, lipid management, diabetes, heart failure and other comorbidities.    Expected Outcomes  Short Term Goal: Understand basic principles of dietary content, such as calories, fat, sodium, cholesterol and nutrients.;Long Term Goal: Adherence to prescribed nutrition plan.       Nutrition Discharge: Nutrition Assessments - 02/06/18 1105      MEDFICTS Scores   Pre Score  36       Education Questionnaire Score: Knowledge Questionnaire Score - 02/11/18 0759      Knowledge Questionnaire Score   Pre Score  22/24       Goals reviewed with patient; copy given to patient. Pt graduated from cardiac rehab program today with completion of 7 exercise sessions in Phase II. Pt maintained good attendance and progressed nicely during her participation in rehab as evidenced by increased MET level.   Medication list reconciled. Repeat  PHQ score- 0.  Pt has made significant lifestyle changes and should be commended for her success. Pt feels she has achieved her goals during cardiac rehab.  Pt plans to continue exercise at  home.  Noel Christmas

## 2018-03-07 ENCOUNTER — Encounter (HOSPITAL_COMMUNITY): Payer: 59

## 2018-03-10 ENCOUNTER — Encounter (HOSPITAL_COMMUNITY): Payer: 59

## 2018-03-12 ENCOUNTER — Encounter (HOSPITAL_COMMUNITY): Payer: 59

## 2018-03-14 ENCOUNTER — Encounter (HOSPITAL_COMMUNITY): Payer: 59

## 2018-03-17 ENCOUNTER — Encounter (HOSPITAL_COMMUNITY): Payer: 59

## 2018-03-19 ENCOUNTER — Encounter (HOSPITAL_COMMUNITY): Payer: 59

## 2018-03-21 ENCOUNTER — Encounter (HOSPITAL_COMMUNITY): Payer: 59

## 2018-03-21 ENCOUNTER — Other Ambulatory Visit: Payer: Self-pay | Admitting: Cardiology

## 2018-03-21 NOTE — Telephone Encounter (Signed)
This is Dr. Croitoru's pt 

## 2018-03-24 ENCOUNTER — Encounter (HOSPITAL_COMMUNITY): Payer: 59

## 2018-03-26 ENCOUNTER — Encounter (HOSPITAL_COMMUNITY): Payer: 59

## 2018-03-28 ENCOUNTER — Encounter (HOSPITAL_COMMUNITY): Payer: 59

## 2018-03-31 ENCOUNTER — Encounter (HOSPITAL_COMMUNITY): Payer: 59

## 2018-04-02 ENCOUNTER — Encounter (HOSPITAL_COMMUNITY): Payer: 59

## 2018-04-04 ENCOUNTER — Encounter (HOSPITAL_COMMUNITY): Payer: 59

## 2018-04-07 ENCOUNTER — Encounter (HOSPITAL_COMMUNITY): Payer: 59

## 2018-04-09 ENCOUNTER — Encounter (HOSPITAL_COMMUNITY): Payer: 59

## 2018-04-09 DIAGNOSIS — Z79899 Other long term (current) drug therapy: Secondary | ICD-10-CM | POA: Diagnosis not present

## 2018-04-09 DIAGNOSIS — E7800 Pure hypercholesterolemia, unspecified: Secondary | ICD-10-CM | POA: Diagnosis not present

## 2018-04-10 LAB — LIPID PANEL
Chol/HDL Ratio: 2.3 ratio (ref 0.0–4.4)
Cholesterol, Total: 124 mg/dL (ref 100–199)
HDL: 55 mg/dL (ref >39)
LDL Calculated: 59 mg/dL (ref 0–99)
Triglycerides: 51 mg/dL (ref 0–149)
VLDL Cholesterol Cal: 10 mg/dL (ref 5–40)

## 2018-04-10 LAB — COMPREHENSIVE METABOLIC PANEL
ALT: 18 IU/L (ref 0–32)
AST: 17 IU/L (ref 0–40)
Albumin/Globulin Ratio: 1.7 (ref 1.2–2.2)
Albumin: 4.2 g/dL (ref 3.5–5.5)
Alkaline Phosphatase: 51 IU/L (ref 39–117)
BUN/Creatinine Ratio: 15 (ref 9–23)
BUN: 12 mg/dL (ref 6–24)
Bilirubin Total: 1.3 mg/dL — ABNORMAL HIGH (ref 0.0–1.2)
CO2: 23 mmol/L (ref 20–29)
Calcium: 9.7 mg/dL (ref 8.7–10.2)
Chloride: 107 mmol/L — ABNORMAL HIGH (ref 96–106)
Creatinine, Ser: 0.79 mg/dL (ref 0.57–1.00)
GFR calc Af Amer: 100 mL/min/{1.73_m2} (ref >59)
GFR calc non Af Amer: 86 mL/min/{1.73_m2} (ref >59)
Globulin, Total: 2.5 g/dL (ref 1.5–4.5)
Glucose: 85 mg/dL (ref 65–99)
Potassium: 4.6 mmol/L (ref 3.5–5.2)
Sodium: 142 mmol/L (ref 134–144)
Total Protein: 6.7 g/dL (ref 6.0–8.5)

## 2018-04-11 ENCOUNTER — Encounter (HOSPITAL_COMMUNITY): Payer: 59

## 2018-04-14 ENCOUNTER — Encounter: Payer: Self-pay | Admitting: Cardiovascular Disease

## 2018-04-14 ENCOUNTER — Ambulatory Visit (INDEPENDENT_AMBULATORY_CARE_PROVIDER_SITE_OTHER): Payer: 59 | Admitting: Cardiovascular Disease

## 2018-04-14 ENCOUNTER — Encounter (HOSPITAL_COMMUNITY): Payer: 59

## 2018-04-14 VITALS — BP 124/70 | HR 69 | Ht 63.0 in | Wt 135.0 lb

## 2018-04-14 DIAGNOSIS — E785 Hyperlipidemia, unspecified: Secondary | ICD-10-CM | POA: Diagnosis not present

## 2018-04-14 DIAGNOSIS — I2510 Atherosclerotic heart disease of native coronary artery without angina pectoris: Secondary | ICD-10-CM

## 2018-04-14 DIAGNOSIS — Z9861 Coronary angioplasty status: Secondary | ICD-10-CM

## 2018-04-14 DIAGNOSIS — I10 Essential (primary) hypertension: Secondary | ICD-10-CM | POA: Diagnosis not present

## 2018-04-14 NOTE — Progress Notes (Signed)
Cardiology Consultation Note    Date:  04/14/2018   ID:  Nancy [name], DOB 29-Dec-1965, MRN [id number]  PCP:  Nancy [name], [facility]  Cardiologist:   Sanda Klein, MD  Referred in consultation: By Nancy Fendt, PA-C, for exertional chest pain and exertional dyspnea No chief complaint on file.   History of Present Illness:  Nancy [name] is a 52 y.o. female with a strong family history of premature coronary artery disease but without any other coronary risk factors, presenting with complaints of exertional dyspnea, found to have coronary artery disease and received drug-eluting stents to the second oblique marginal in the LAD artery in January 2019.  In addition had a high-grade stenosis in the ramus intermedius, but this vessel was too small for PCI.  35% stenosis was seen in the RCA.  Right heart catheterization showed essentially normal pressures.  Her exercise ability has returned to normal.  She no longer feels short of breath even when she is running 3-4 times a week.  Went to cardiac rehab for a couple of weeks, but felt that she was doing more on her own.  Echo last year showed left ventricular wall thickness and systolic function was normal, but there was evidence of diastolic dysfunction  She had a partial hysterectomy roughly 5 years ago and is currently having some perimenopausal hot flash symptoms. Her father had heart disease starting in his 80s and has undergone both stents and bypass surgery. Her mother started having coronary problems in her 72s and had an aborted infarction with percutaneous revascularization.    Has a dry cough with ACE inhibitors.  Tolerating losartan without problems.  Repeat labs on atorvastatin 80 mg once daily shows an excellent LDL cholesterol of 59 and HDL of 55.  Conclusion CATH 12/24/2017    Prox LAD lesion is 95% stenosed.  A drug-eluting stent was successfully placed using a STENT SYNERGY DES 2.5X28.  Post intervention, there is a 0%  residual stenosis.  _____________  Colon Flattery 1st Mrg lesion is 80% stenosed. Ost 1st Mrg to 1st Mrg lesion is 85% stenosed.  2 Overlapping drug-eluting stents were successfully placed using a STENT SYNERGY DES 2.25X16 with a STENT SYNERGY DES 2.25X12 proximally  Post intervention, there is a 0% residual stenosis.  1st Mrg lesion is 90% stenosed. Distal wire dissection  Balloon angioplasty was performed using a BALLOON SAPPHIRE 2.0X15. Post intervention, there is a 0% residual stenosis.  _____________  [name] lesion is 95% stenosed. Too small for PCI  Ost 1st Diag lesion is 50% stenosed.  The left ventricular systolic function is normal. The left ventricular ejection fraction is 55-65% by visual estimate.  Normal RHC Pressures.   Severe 2 vessel PCI - pLAD 99% & ost-p OM1. -- PCI LAD 1 stent, OM1 2 overlapping DES with PTCA distal to stent 2/2 wire dissection. Essentially normal right heart cath pressures with LVEDP and wedge pressure of 11-15 mmHg.  Plan:  She will be monitored overnight for post PCI complications anticipate discharge tomorrow if stable.   DAPT with aspirin and Plavix for minimum 1 year.  Restart home medications, but have increase Crestor to 40 mg daily.  Did not restart Lasix, could potentially give tomorrow versus the day after discharge.      Past Medical History:  Diagnosis Date  . Anxiety   . CAD (coronary artery disease)    1/19 PCI/DESx1 to LAD, and DESx2 to OM, normal EF via LV gram  . Hyperlipidemia   .  Hypertension     Past Surgical History:  Procedure Laterality Date  . ABDOMINAL HYSTERECTOMY    . CORONARY ANGIOPLASTY WITH STENT PLACEMENT  12/24/2017  . CORONARY BALLOON ANGIOPLASTY N/A 12/24/2017   Procedure: CORONARY BALLOON ANGIOPLASTY;  Surgeon: Leonie Man, MD;  Location: [facility];  Service: Cardiovascular;  Laterality: N/A;  . CORONARY STENT INTERVENTION N/A 12/24/2017   Procedure: CORONARY STENT INTERVENTION;   Surgeon: Leonie Man, MD;  Location: [place] [facility];  Service: Cardiovascular;  Laterality: N/A;  . DIAGNOSTIC LAPAROSCOPY    . ENDOMETRIAL ABLATION W/ NOVASURE    . LAPAROSCOPIC ASSISTED VAGINAL HYSTERECTOMY N/A 07/07/2013   Procedure: LAPAROSCOPIC ASSISTED VAGINAL HYSTERECTOMY;  Surgeon: Darlyn Chamber, MD;  Location: [facility];  Service: Gynecology;  Laterality: N/A;  . RIGHT/LEFT HEART CATH AND CORONARY ANGIOGRAPHY N/A 12/24/2017   Procedure: RIGHT/LEFT HEART CATH AND CORONARY ANGIOGRAPHY;  Surgeon: Leonie Man, MD;  Location: [facility];  Service: Cardiovascular;  Laterality: N/A;  . TONSILLECTOMY    . TUBAL LIGATION     bilateral    Current Medications: Outpatient Medications Prior to Visit  Medication Sig Dispense Refill  . aspirin EC 81 MG tablet Take 1 tablet (81 mg total) by mouth daily. 90 tablet 3  . atorvastatin (LIPITOR) 80 MG tablet TAKE 1 TABLET BY MOUTH EVERY DAY 30 tablet 6  . carvedilol (COREG) 6.25 MG tablet Take 1 tablet (6.25 mg total) by mouth 2 (two) times daily. 180 tablet 3  . clopidogrel (PLAVIX) 75 MG tablet Take 1 tablet (75 mg total) by mouth daily with breakfast. 90 tablet 2  . nitroGLYCERIN (NITROSTAT) 0.4 MG SL tablet Place 1 tablet (0.4 mg total) under the tongue every 5 (five) minutes as needed. 25 tablet 2  . traZODone (DESYREL) 100 MG tablet Take 1 tablet (100 mg total) by mouth at bedtime as needed for sleep. 90 tablet 1  . losartan (COZAAR) 25 MG tablet Take 0.5 tablets (12.5 mg total) by mouth daily. 45 tablet 3   No facility-administered medications prior to visit.      Allergies:   Patient has no known allergies.   Social History   Socioeconomic History  . Marital status: Married    Spouse name: Not on file  . Number of children: Not on file  . Years of education: Not on file  . Highest education level: Not on file  Occupational History  . Occupation: Journalist, newspaper  . Financial resource strain: Not on file    . Food insecurity:    Worry: Never true    Inability: Never true  . Transportation needs:    Medical: No    Non-medical: No  Tobacco Use  . Smoking status: Never Smoker  . Smokeless tobacco: Never Used  Substance and Sexual Activity  . Alcohol use: Yes    Alcohol/week: 6.0 oz    Types: 10 Glasses of wine per week    Comment: 12/24/2017 "1-2 glasses of wine/day"  . Drug use: No  . Sexual activity: Yes    Birth control/protection: Other-see comments    Comment: pill  Lifestyle  . Physical activity:    Days per week: 5 days    Minutes per session: 40 min  . Stress: Not on file  Relationships  . Social connections:    Talks on phone: Not on file    Gets together: Not on file    Attends religious service: Not on file  Active member of club or organization: Not on file    Attends meetings of clubs or organizations: Not on file    Relationship status: Not on file  Other Topics Concern  . Not on file  Social History Narrative  . Not on file     Family History:  The patient's family history includes Coronary artery disease in her father; Heart attack in her father and paternal uncle; Heart disease in her paternal uncle; Hypertension in her father; Stroke in her father.   ROS:   Please see the history of present illness.    ROS All other systems reviewed and are negative.   PHYSICAL EXAM:   VS:  BP 124/70   Pulse 69   Ht 5\' 3"  (1.6 m)   Wt 135 lb (61.2 kg)   LMP 04/17/2013   BMI 23.91 kg/m      General: Alert, oriented x3, no distress, lean and fit Head: no evidence of trauma, PERRL, EOMI, no exophtalmos or lid lag, no myxedema, no xanthelasma; normal ears, nose and oropharynx Neck: normal jugular venous pulsations and no hepatojugular reflux; brisk carotid pulses without delay and no carotid bruits Chest: clear to auscultation, no signs of consolidation by percussion or palpation, normal fremitus, symmetrical and full respiratory excursions Cardiovascular: normal  position and quality of the apical impulse, regular rhythm, normal first and second heart sounds, no murmurs, rubs or gallops Abdomen: no tenderness or distention, no masses by palpation, no abnormal pulsatility or arterial bruits, normal bowel sounds, no hepatosplenomegaly Extremities: no clubbing, cyanosis or edema; 2+ radial, ulnar and brachial pulses bilaterally; 2+ right femoral, posterior tibial and dorsalis pedis pulses; 2+ left femoral, posterior tibial and dorsalis pedis pulses; no subclavian or femoral bruits Neurological: grossly nonfocal Psych: Normal mood and affect   Wt Readings from Last 3 Encounters:  04/14/18 135 lb (61.2 kg)  02/06/18 134 lb 7.7 oz (61 kg)  01/08/18 133 lb 6.4 oz (60.5 kg)      Studies/Labs Reviewed:   EKG:  EKG is ordered today. It shows sinus rhythm, normal tracing  Recent Labs: 12/25/2017: Platelets 234 12/28/2017: Hemoglobin 11.5 04/09/2018: ALT 18; BUN 12; Creatinine, Ser 0.79; Potassium 4.6; Sodium 142   Lipid Panel    Component Value Date/Time   CHOL 124 04/09/2018 0755   CHOL 147 02/13/2013 0902   TRIG 51 04/09/2018 0755   TRIG 72 02/13/2013 0902   HDL 55 04/09/2018 0755   HDL 43 02/13/2013 0902   CHOLHDL 2.3 04/09/2018 0755   CHOLHDL 3.1 08/17/2016 0937   VLDL 12 08/17/2016 0937   LDLCALC 59 04/09/2018 0755   LDLCALC 90 02/13/2013 0902    ASSESSMENT:    1. CAD S/P PCI with DES   2. Dyslipidemia   3. Benign essential HTN      PLAN:   1. CAD: Presented with exertional dyspnea echo evidence of diastolic dysfunction, never had angina pectoris.  Now is dyspnea free off diuretics.  Encourage continued physical activity.  Clopidogrel through end of January 2020. 2. HLP: LDL at target less than 70 on current dose of statin 3. HTN: Her blood pressure is well controlled.  Continue same medications.  Medication Adjustments/Labs and Tests Ordered: Current medicines are reviewed at length with the patient today.  Concerns regarding  medicines are outlined above.  Medication changes, Labs and Tests ordered today are listed in the Patient Instructions below. Patient Instructions  Dr Sallyanne Kuster recommends that you schedule a follow-up appointment in 8 months. You will  receive a reminder letter in the mail two months in advance. If you don't receive a letter, please call our office to schedule the follow-up appointment.  If you need a refill on your cardiac medications before your next appointment, please call your pharmacy.    Signed, Sanda Klein, MD  04/14/2018 8:57 AM    [facility] [place], [place], [place]  [address] Phone: [phone]; Fax: [phone]

## 2018-04-14 NOTE — Patient Instructions (Signed)
Dr Sallyanne Kuster recommends that you schedule a follow-up appointment in 8 months. You will receive a reminder letter in the mail two months in advance. If you don't receive a letter, please call our office to schedule the follow-up appointment.  If you need a refill on your cardiac medications before your next appointment, please call your pharmacy.

## 2018-04-16 ENCOUNTER — Encounter (HOSPITAL_COMMUNITY): Payer: 59

## 2018-04-18 ENCOUNTER — Encounter (HOSPITAL_COMMUNITY): Payer: 59

## 2018-04-21 ENCOUNTER — Encounter (HOSPITAL_COMMUNITY): Payer: 59

## 2018-04-23 ENCOUNTER — Encounter (HOSPITAL_COMMUNITY): Payer: 59

## 2018-04-25 ENCOUNTER — Encounter (HOSPITAL_COMMUNITY): Payer: 59

## 2018-04-30 ENCOUNTER — Encounter (HOSPITAL_COMMUNITY): Payer: 59

## 2018-05-02 ENCOUNTER — Encounter (HOSPITAL_COMMUNITY): Payer: 59

## 2018-05-05 ENCOUNTER — Encounter (HOSPITAL_COMMUNITY): Payer: 59

## 2018-05-07 ENCOUNTER — Encounter (HOSPITAL_COMMUNITY): Payer: 59

## 2018-05-09 ENCOUNTER — Encounter (HOSPITAL_COMMUNITY): Payer: 59

## 2018-05-12 ENCOUNTER — Encounter (HOSPITAL_COMMUNITY): Payer: 59

## 2018-05-14 ENCOUNTER — Encounter (HOSPITAL_COMMUNITY): Payer: 59

## 2018-07-08 DIAGNOSIS — Z01419 Encounter for gynecological examination (general) (routine) without abnormal findings: Secondary | ICD-10-CM | POA: Diagnosis not present

## 2018-07-22 ENCOUNTER — Encounter: Payer: Self-pay | Admitting: Gastroenterology

## 2018-09-08 ENCOUNTER — Telehealth: Payer: Self-pay

## 2018-09-08 ENCOUNTER — Encounter (INDEPENDENT_AMBULATORY_CARE_PROVIDER_SITE_OTHER): Payer: Self-pay

## 2018-09-08 ENCOUNTER — Encounter: Payer: Self-pay | Admitting: Gastroenterology

## 2018-09-08 ENCOUNTER — Ambulatory Visit: Payer: 59 | Admitting: Gastroenterology

## 2018-09-08 VITALS — BP 130/70 | HR 76 | Ht 62.3 in | Wt 139.1 lb

## 2018-09-08 DIAGNOSIS — Z1212 Encounter for screening for malignant neoplasm of rectum: Secondary | ICD-10-CM

## 2018-09-08 DIAGNOSIS — Z7902 Long term (current) use of antithrombotics/antiplatelets: Secondary | ICD-10-CM | POA: Diagnosis not present

## 2018-09-08 DIAGNOSIS — Z1211 Encounter for screening for malignant neoplasm of colon: Secondary | ICD-10-CM | POA: Diagnosis not present

## 2018-09-08 NOTE — Telephone Encounter (Signed)
[facility] Pre-operative Risk Assessment     Request for surgical clearance:     Endoscopy Procedure  What type of surgery is being performed?     Colonoscopy   When is this surgery scheduled?     TBA, not sure if she needed to wait 1 year from stent placement  What type of clearance is required ?   Pharmacy  Are there any medications that need to be held prior to surgery and how long? Plavix x 5 days  Practice name and name of physician performing surgery?      [facility]  What is your office phone and fax number?      Phone- [phone]  [phone]  Anesthesia type (None, local, MAC, general) ?       MAC

## 2018-09-08 NOTE — Telephone Encounter (Signed)
Message sent to scheduling to make appointment for patient for January.

## 2018-09-08 NOTE — Telephone Encounter (Signed)
Sent via [facility] fax to Dr. Lynne Leader office after speaking to their office.   Also contacted patient and let her know to wait until after she sees cardiology in January to schedule colonoscopy.

## 2018-09-08 NOTE — Patient Instructions (Signed)
We will contact your cardiologist and attempt to get Plavix clearance prior to scheduling a Colonoscopy. We will contact you after we get a response.   Thank you for choosing me and [facility].  Pricilla Riffle. Dagoberto Ligas., MD., Marval Regal

## 2018-09-08 NOTE — Telephone Encounter (Signed)
   Primary Cardiologist: Sanda Klein, MD  Chart reviewed as part of pre-operative protocol coverage. Patient was contacted 09/08/2018 in reference to pre-operative risk assessment for pending surgery as outlined below.  Nancy [name] was last seen on 04/2018  by Dr. Jerilynn Mages. Croitoru  Pt has new Coronary artery stents and may not come off plavix until after visit in Jan with Cardiology.  This appears to be screening colonoscopy. .  Due to new or worsening symptoms, Nancy [name] will require a follow-up visit for further pre-operative risk assessment.  Pre-op covering staff: - Please schedule appointment and call patient to inform them.  Appt in Jan 2020 is fine.    - Please contact requesting surgeon's office via preferred method (i.e, phone, fax) to inform them of need for appointment prior to surgery.  Cecilie Kicks, NP 09/08/2018, 4:05 PM

## 2018-09-08 NOTE — Telephone Encounter (Signed)
Please place colonoscopy recall for 01/2019

## 2018-09-08 NOTE — Progress Notes (Signed)
History of Present Illness: This is a 52 year old female referred by Shawnee Knapp, MD for the evaluation of CRC screening on Plavix.  She underwent colonoscopy in September 2006 which was normal.  She has no ongoing gastrointestinal complaints.  She had 2 coronary artery stents placed in January. Denies weight loss, abdominal pain, constipation, diarrhea, change in stool caliber, melena, hematochezia, nausea, vomiting, dysphagia, reflux symptoms, chest pain.     No Known Allergies Outpatient Medications Prior to Visit  Medication Sig Dispense Refill  . aspirin EC 81 MG tablet Take 1 tablet (81 mg total) by mouth daily. 90 tablet 3  . atorvastatin (LIPITOR) 80 MG tablet TAKE 1 TABLET BY MOUTH EVERY DAY 30 tablet 6  . carvedilol (COREG) 6.25 MG tablet Take 1 tablet (6.25 mg total) by mouth 2 (two) times daily. 180 tablet 3  . clopidogrel (PLAVIX) 75 MG tablet Take 1 tablet (75 mg total) by mouth daily with breakfast. 90 tablet 2  . losartan (COZAAR) 25 MG tablet Take 0.5 tablets (12.5 mg total) by mouth daily. 45 tablet 3  . traZODone (DESYREL) 100 MG tablet Take 1 tablet (100 mg total) by mouth at bedtime as needed for sleep. 90 tablet 1  . nitroGLYCERIN (NITROSTAT) 0.4 MG SL tablet Place 1 tablet (0.4 mg total) under the tongue every 5 (five) minutes as needed. (Patient not taking: Reported on 09/08/2018) 25 tablet 2   No facility-administered medications prior to visit.    Past Medical History:  Diagnosis Date  . Anxiety    pt denies  . CAD (coronary artery disease)    1/19 PCI/DESx1 to LAD, and DESx2 to OM, normal EF via LV gram  . Hyperlipidemia   . Hypertension    Past Surgical History:  Procedure Laterality Date  . ABDOMINAL HYSTERECTOMY    . CORONARY ANGIOPLASTY WITH STENT PLACEMENT  12/24/2017  . CORONARY BALLOON ANGIOPLASTY N/A 12/24/2017   Procedure: CORONARY BALLOON ANGIOPLASTY;  Surgeon: Leonie Man, MD;  Location: [facility];  Service: Cardiovascular;   Laterality: N/A;  . CORONARY STENT INTERVENTION N/A 12/24/2017   Procedure: CORONARY STENT INTERVENTION;  Surgeon: Leonie Man, MD;  Location: [facility];  Service: Cardiovascular;  Laterality: N/A;  . DIAGNOSTIC LAPAROSCOPY    . ENDOMETRIAL ABLATION W/ NOVASURE    . LAPAROSCOPIC ASSISTED VAGINAL HYSTERECTOMY N/A 07/07/2013   Procedure: LAPAROSCOPIC ASSISTED VAGINAL HYSTERECTOMY;  Surgeon: Darlyn Chamber, MD;  Location: [facility];  Service: Gynecology;  Laterality: N/A;  . RIGHT/LEFT HEART CATH AND CORONARY ANGIOGRAPHY N/A 12/24/2017   Procedure: RIGHT/LEFT HEART CATH AND CORONARY ANGIOGRAPHY;  Surgeon: Leonie Man, MD;  Location: [facility];  Service: Cardiovascular;  Laterality: N/A;  . TONSILLECTOMY    . TUBAL LIGATION     bilateral   Social History   Socioeconomic History  . Marital status: Married    Spouse name: Not on file  . Number of children: 2  . Years of education: Not on file  . Highest education level: Not on file  Occupational History  . Occupation: Journalist, newspaper  . Financial resource strain: Not on file  . Food insecurity:    Worry: Never true    Inability: Never true  . Transportation needs:    Medical: No    Non-medical: No  Tobacco Use  . Smoking status: Never Smoker  . Smokeless tobacco: Never Used  Substance and Sexual Activity  . Alcohol use: Yes  Alcohol/week: 10.0 standard drinks    Types: 10 Glasses of wine per week    Comment: 12/24/2017 "1-2 glasses of wine/day"  . Drug use: No  . Sexual activity: Yes    Birth control/protection: Other-see comments    Comment: pill  Lifestyle  . Physical activity:    Days per week: 5 days    Minutes per session: 40 min  . Stress: Not on file  Relationships  . Social connections:    Talks on phone: Not on file    Gets together: Not on file    Attends religious service: Not on file    Active member of club or organization: Not on file    Attends meetings of clubs or  organizations: Not on file    Relationship status: Not on file  Other Topics Concern  . Not on file  Social History Narrative  . Not on file   Family History  Problem Relation Age of Onset  . Uterine cancer Mother   . Coronary artery disease Father   . Hypertension Father   . Stroke Father   . Heart attack Father   . Heart disease Paternal Uncle   . Heart attack Paternal Uncle   . Diabetes Paternal Grandmother   . Kidney disease Paternal Grandmother        Review of Systems: Pertinent positive and negative review of systems were noted in the above HPI section. All other review of systems were otherwise negative.   Physical Exam: General: Well developed, well nourished, no acute distress Head: Normocephalic and atraumatic Eyes:  sclerae anicteric, EOMI Ears: Normal auditory acuity Mouth: No deformity or lesions Neck: Supple, no masses or thyromegaly Lungs: Clear throughout to auscultation Heart: Regular rate and rhythm; no murmurs, rubs or bruits Abdomen: Soft, non tender and non distended. No masses, hepatosplenomegaly or hernias noted. Normal Bowel sounds Rectal: Deferred to colonoscopy Musculoskeletal: Symmetrical with no gross deformities  Skin: No lesions on visible extremities Pulses:  Normal pulses noted Extremities: No clubbing, cyanosis, edema or deformities noted Neurological: Alert oriented x 4, grossly nonfocal Cervical Nodes:  No significant cervical adenopathy Inguinal Nodes: No significant inguinal adenopathy Psychological:  Alert and cooperative. Normal mood and affect   Assessment and Recommendations:  1. CRC screening, average risk.  Schedule colonoscopy when cleared by Cardiology, likely will be 1 year after stents placed, likely after January 2020. The risks (including bleeding, perforation, infection, missed lesions, medication reactions and possible hospitalization or surgery if complications occur), benefits, and alternatives to colonoscopy with  possible biopsy and possible polypectomy were discussed with the patient and they consent to proceed.   2. CAD, post stent placement. Hold Plavix 5 days before procedure - will instruct when and how to resume after procedure. Low but real risk of cardiovascular event such as heart attack, stroke, embolism, thrombosis or ischemia/infarct of other organs off Plavix explained and need to seek urgent help if this occurs. The patient consents to proceed. Will communicate by phone or EMR with patient's prescribing provider to confirm that holding Plavix is reasonable in this case. Continue ASA daily.    cc: Shawnee Knapp, MD [address] [place], [place] [address]

## 2018-09-17 ENCOUNTER — Telehealth: Payer: Self-pay | Admitting: *Deleted

## 2018-09-17 NOTE — Telephone Encounter (Signed)
Left message for patient to call and schedule appt with Dr. Sallyanne Kuster in January 2020

## 2018-09-18 ENCOUNTER — Other Ambulatory Visit: Payer: Self-pay | Admitting: Cardiology

## 2018-10-17 ENCOUNTER — Other Ambulatory Visit: Payer: Self-pay | Admitting: Cardiovascular Disease

## 2018-10-25 ENCOUNTER — Other Ambulatory Visit: Payer: Self-pay | Admitting: Cardiovascular Disease

## 2018-12-22 ENCOUNTER — Other Ambulatory Visit: Payer: Self-pay | Admitting: Cardiology

## 2018-12-22 NOTE — Telephone Encounter (Signed)
Rx has been sent to the pharmacy electronically. ° °

## 2018-12-26 ENCOUNTER — Ambulatory Visit: Payer: 59 | Admitting: Cardiovascular Disease

## 2018-12-26 ENCOUNTER — Encounter: Payer: Self-pay | Admitting: Cardiovascular Disease

## 2018-12-26 VITALS — BP 138/70 | HR 74 | Ht 63.0 in | Wt 142.0 lb

## 2018-12-26 DIAGNOSIS — I5032 Chronic diastolic (congestive) heart failure: Secondary | ICD-10-CM

## 2018-12-26 DIAGNOSIS — E785 Hyperlipidemia, unspecified: Secondary | ICD-10-CM

## 2018-12-26 DIAGNOSIS — I2510 Atherosclerotic heart disease of native coronary artery without angina pectoris: Secondary | ICD-10-CM

## 2018-12-26 DIAGNOSIS — I10 Essential (primary) hypertension: Secondary | ICD-10-CM | POA: Diagnosis not present

## 2018-12-26 NOTE — Patient Instructions (Signed)
Medication Instructions:  Your physician has recommended you make the following change in your medication:  1) STOP Plavix 2) STOP Losartan  If you need a refill on your cardiac medications before your next appointment, please call your pharmacy.   Lab work: Your physician recommends that you return for a FASTING lipid profile in May 2020  If you have labs (blood work) drawn today and your tests are completely normal, you will receive your results only by: Marland Kitchen MyChart Message (if you have MyChart) OR . A paper copy in the mail If you have any lab test that is abnormal or we need to change your treatment, we will call you to review the results.  Testing/Procedures: None ordered  Follow-Up: At [facility], you and your health needs are our priority.  As part of our continuing mission to provide you with exceptional heart care, we have created designated Provider Care Teams.  These Care Teams include your primary Cardiologist (physician) and Advanced Practice Providers (APPs -  Physician Assistants and Nurse Practitioners) who all work together to provide you with the care you need, when you need it. You will need a follow up appointment in 12 months.  Please call our office 2 months in advance to schedule this appointment.  You may see Sanda Klein, MD or one of the following Advanced Practice Providers on your designated Care Team: [place], [place] . Fabian Sharp, PA-C  Any Other Special Instructions Will Be Listed Below (If Applicable). Your physician has requested that you regularly monitor and record your blood pressure readings at home. Please use the same machine at the same time of day to check your readings and record. Please call the office in 1-2 weeks to provide your blood pressure readings.

## 2018-12-26 NOTE — Progress Notes (Signed)
Cardiology Office Note    Date:  12/26/2018   ID:  Nancy [name], DOB [birth year], MRN [id number]  PCP:  Philis Fendt, [facility]  Cardiologist:   Sanda Klein, MD   Chief Complaint  Patient presents with  . Coronary Artery Disease    History of Present Illness:  Nancy [name] is a 53 y.o. female with a strong family history of premature coronary artery disease but without any other coronary risk factors, presenting with complaints of exertional dyspnea, found to have coronary artery disease and received drug-eluting stents to the second oblique marginal in the LAD artery in January 2019.  In addition had a high-grade stenosis in the ramus intermedius, but this vessel was too small for PCI.  35% stenosis was seen in the RCA.  Right heart catheterization showed essentially normal pressures.  Feels great.  She has no exercise limitations.  She walks at least 3 days a week for about 45 minutes in the wintertime, walks daily during the summer.  No longer has trouble running up the stairs or rushing to the car, as she did before the stent.  Never had exertional angina sensation was exclusively with exertional dyspnea.  The patient specifically denies any chest pain at rest exertion, dyspnea at rest or with exertion, orthopnea, paroxysmal nocturnal dyspnea, syncope, palpitations, focal neurological deficits, intermittent claudication, lower extremity edema, unexplained weight gain, cough, hemoptysis or wheezing.  Echo 2017 showed left ventricular wall thickness and systolic function was normal, but there was evidence of diastolic dysfunction  She had a partial hysterectomy roughly 5 years ago and is currently having some perimenopausal hot flash symptoms. Her father had heart disease starting in his 62s and has undergone both stents and bypass surgery. Her mother started having coronary problems in her 61s and had an aborted infarction with percutaneous revascularization.   Has a dry cough with ACE  inhibitors.  Repeat labs on atorvastatin 80 mg once daily shows an excellent LDL cholesterol of 59 and HDL of 55.  Conclusion CATH 12/24/2017    Prox LAD lesion is 95% stenosed.  A drug-eluting stent was successfully placed using a STENT SYNERGY DES 2.5X28.  Post intervention, there is a 0% residual stenosis.  _____________  Colon Flattery 1st Mrg lesion is 80% stenosed. Ost 1st Mrg to 1st Mrg lesion is 85% stenosed.  2 Overlapping drug-eluting stents were successfully placed using a STENT SYNERGY DES 2.25X16 with a STENT SYNERGY DES 2.25X12 proximally  Post intervention, there is a 0% residual stenosis.  1st Mrg lesion is 90% stenosed. Distal wire dissection  Balloon angioplasty was performed using a BALLOON SAPPHIRE 2.0X15. Post intervention, there is a 0% residual stenosis.  _____________  [name] lesion is 95% stenosed. Too small for PCI  Ost 1st Diag lesion is 50% stenosed.  The left ventricular systolic function is normal. The left ventricular ejection fraction is 55-65% by visual estimate.  Normal RHC Pressures.   Severe 2 vessel PCI - pLAD 99% & ost-p OM1. -- PCI LAD 1 stent, OM1 2 overlapping DES with PTCA distal to stent 2/2 wire dissection. Essentially normal right heart cath pressures with LVEDP and wedge pressure of 11-15 mmHg.  Plan:  She will be monitored overnight for post PCI complications anticipate discharge tomorrow if stable.   DAPT with aspirin and Plavix for minimum 1 year.  Restart home medications, but have increase Crestor to 40 mg daily.  Did not restart Lasix, could potentially give tomorrow versus the day after discharge.  Past Medical History:  Diagnosis Date  . Anxiety    pt denies  . CAD (coronary artery disease)    1/19 PCI/DESx1 to LAD, and DESx2 to OM, normal EF via LV gram  . Hyperlipidemia   . Hypertension     Past Surgical History:  Procedure Laterality Date  . ABDOMINAL HYSTERECTOMY    . CORONARY ANGIOPLASTY WITH STENT  PLACEMENT  12/24/2017  . CORONARY BALLOON ANGIOPLASTY N/A 12/24/2017   Procedure: CORONARY BALLOON ANGIOPLASTY;  Surgeon: Leonie Man, MD;  Location: [facility];  Service: Cardiovascular;  Laterality: N/A;  . CORONARY STENT INTERVENTION N/A 12/24/2017   Procedure: CORONARY STENT INTERVENTION;  Surgeon: Leonie Man, MD;  Location: [facility];  Service: Cardiovascular;  Laterality: N/A;  . DIAGNOSTIC LAPAROSCOPY    . ENDOMETRIAL ABLATION W/ NOVASURE    . LAPAROSCOPIC ASSISTED VAGINAL HYSTERECTOMY N/A 07/07/2013   Procedure: LAPAROSCOPIC ASSISTED VAGINAL HYSTERECTOMY;  Surgeon: Darlyn Chamber, MD;  Location: [facility];  Service: Gynecology;  Laterality: N/A;  . RIGHT/LEFT HEART CATH AND CORONARY ANGIOGRAPHY N/A 12/24/2017   Procedure: RIGHT/LEFT HEART CATH AND CORONARY ANGIOGRAPHY;  Surgeon: Leonie Man, MD;  Location: [facility];  Service: Cardiovascular;  Laterality: N/A;  . TONSILLECTOMY    . TUBAL LIGATION     bilateral    Current Medications: Outpatient Medications Prior to Visit  Medication Sig Dispense Refill  . aspirin EC 81 MG tablet Take 1 tablet (81 mg total) by mouth daily. 90 tablet 3  . atorvastatin (LIPITOR) 80 MG tablet TAKE 1 TABLET BY MOUTH EVERY DAY 30 tablet 6  . carvedilol (COREG) 6.25 MG tablet TAKE 1 TABLET BY MOUTH TWICE A DAY 60 tablet 11  . nitroGLYCERIN (NITROSTAT) 0.4 MG SL tablet Place 1 tablet (0.4 mg total) under the tongue every 5 (five) minutes as needed. 25 tablet 2  . traZODone (DESYREL) 100 MG tablet Take 1 tablet (100 mg total) by mouth at bedtime as needed for sleep. 90 tablet 1  . clopidogrel (PLAVIX) 75 MG tablet TAKE 1 TABLET BY MOUTH EVERY MORNING WITH BREAKFAST 30 tablet 8  . losartan (COZAAR) 25 MG tablet TAKE 1/2 TABLET BY MOUTH DAILY 45 tablet 1   No facility-administered medications prior to visit.      Allergies:   Patient has no known allergies.   Social History   Socioeconomic History  . Marital status:  Married    Spouse name: Not on file  . Number of children: 2  . Years of education: Not on file  . Highest education level: Not on file  Occupational History  . Occupation: Journalist, newspaper  . Financial resource strain: Not on file  . Food insecurity:    Worry: Never true    Inability: Never true  . Transportation needs:    Medical: No    Non-medical: No  Tobacco Use  . Smoking status: Never Smoker  . Smokeless tobacco: Never Used  Substance and Sexual Activity  . Alcohol use: Yes    Alcohol/week: 10.0 standard drinks    Types: 10 Glasses of wine per week    Comment: 12/24/2017 "1-2 glasses of wine/day"  . Drug use: No  . Sexual activity: Yes    Birth control/protection: Other-see comments    Comment: pill  Lifestyle  . Physical activity:    Days per week: 5 days    Minutes per session: 40 min  . Stress: Not on file  Relationships  .  Social connections:    Talks on phone: Not on file    Gets together: Not on file    Attends religious service: Not on file    Active member of club or organization: Not on file    Attends meetings of clubs or organizations: Not on file    Relationship status: Not on file  Other Topics Concern  . Not on file  Social History Narrative  . Not on file     Family History:  The patient's family history includes Coronary artery disease in her father; Diabetes in her paternal grandmother; Heart attack in her father and paternal uncle; Heart disease in her paternal uncle; Hypertension in her father; Kidney disease in her paternal grandmother; Stroke in her father; Uterine cancer in her mother.   ROS:   Please see the history of present illness.    ROS all other systems are reviewed and are negative  PHYSICAL EXAM:   VS:  BP 138/70   Pulse 74   Ht 5\' 3"  (1.6 m)   Wt 142 lb (64.4 kg)   LMP 04/17/2013   BMI 25.15 kg/m    Recheck BP 118/62 General: Alert, oriented x3, no distress, looks fit Head: no evidence of trauma, PERRL,  EOMI, no exophtalmos or lid lag, no myxedema, no xanthelasma; normal ears, nose and oropharynx Neck: normal jugular venous pulsations and no hepatojugular reflux; brisk carotid pulses without delay and no carotid bruits Chest: clear to auscultation, no signs of consolidation by percussion or palpation, normal fremitus, symmetrical and full respiratory excursions Cardiovascular: normal position and quality of the apical impulse, regular rhythm, normal first and second heart sounds, no murmurs, rubs or gallops Abdomen: no tenderness or distention, no masses by palpation, no abnormal pulsatility or arterial bruits, normal bowel sounds, no hepatosplenomegaly Extremities: no clubbing, cyanosis or edema; 2+ radial, ulnar and brachial pulses bilaterally; 2+ right femoral, posterior tibial and dorsalis pedis pulses; 2+ left femoral, posterior tibial and dorsalis pedis pulses; no subclavian or femoral bruits Neurological: grossly nonfocal Psych: Normal mood and affect  Wt Readings from Last 3 Encounters:  12/26/18 142 lb (64.4 kg)  09/08/18 139 lb 2 oz (63.1 kg)  04/14/18 135 lb (61.2 kg)    Studies/Labs Reviewed:   EKG:  EKG is ordered today. It shows sinus rhythm, normal tracing, QTc 412 ms Recent Labs: 12/28/2017: Hemoglobin 11.5 04/09/2018: ALT 18; BUN 12; Creatinine, Ser 0.79; Potassium 4.6; Sodium 142   Lipid Panel    Component Value Date/Time   CHOL 124 04/09/2018 0755   CHOL 147 02/13/2013 0902   TRIG 51 04/09/2018 0755   TRIG 72 02/13/2013 0902   HDL 55 04/09/2018 0755   HDL 43 02/13/2013 0902   CHOLHDL 2.3 04/09/2018 0755   CHOLHDL 3.1 08/17/2016 0937   VLDL 12 08/17/2016 0937   LDLCALC 59 04/09/2018 0755   LDLCALC 90 02/13/2013 0902    ASSESSMENT:    1. Coronary artery disease involving native coronary artery of native heart without angina pectoris   2. Chronic diastolic heart failure ([place])   3. Dyslipidemia   4. Essential hypertension      PLAN:   1. CAD: Presented  with exertional dyspnea echo evidence of diastolic dysfunction, never had angina pectoris.  Now is dyspnea free.  Encourage continued physical activity.  Stop clopidogrel.  Yearly follow-up. 2. CHF: Diastolic heart failure was due to myocardial ischemia.  Resolved.  No need for diuretics. 3. HLP: LDL at target less than 70 on current  dose of statin.  Check labs in May. 4. HTN: Her blood pressure is well controlled.  Stop losartan.  Medication Adjustments/Labs and Tests Ordered: Current medicines are reviewed at length with the patient today.  Concerns regarding medicines are outlined above.  Medication changes, Labs and Tests ordered today are listed in the Patient Instructions below. Patient Instructions  Medication Instructions:  Your physician has recommended you make the following change in your medication:  1) STOP Plavix 2) STOP Losartan  If you need a refill on your cardiac medications before your next appointment, please call your pharmacy.   Lab work: Your physician recommends that you return for a FASTING lipid profile in May 2020  If you have labs (blood work) drawn today and your tests are completely normal, you will receive your results only by: Marland Kitchen MyChart Message (if you have MyChart) OR . A paper copy in the mail If you have any lab test that is abnormal or we need to change your treatment, we will call you to review the results.  Testing/Procedures: None ordered  Follow-Up: At [facility], you and your health needs are our priority.  As part of our continuing mission to provide you with exceptional heart care, we have created designated Provider Care Teams.  These Care Teams include your primary Cardiologist (physician) and Advanced Practice Providers (APPs -  Physician Assistants and Nurse Practitioners) who all work together to provide you with the care you need, when you need it. You will need a follow up appointment in 12 months.  Please call our office 2 months in  advance to schedule this appointment.  You may see Sanda Klein, MD or one of the following Advanced Practice Providers on your designated Care Team: [place], [place] . Fabian Sharp, PA-C  Any Other Special Instructions Will Be Listed Below (If Applicable). Your physician has requested that you regularly monitor and record your blood pressure readings at home. Please use the same machine at the same time of day to check your readings and record. Please call the office in 1-2 weeks to provide your blood pressure readings.        Signed, Sanda Klein, MD  12/26/2018 11:03 AM    [facility] [address], [place], [place]  [address] Phone: [phone]; Fax: [phone]

## 2019-01-31 ENCOUNTER — Encounter: Payer: Self-pay | Admitting: Gastroenterology

## 2019-04-05 DIAGNOSIS — M545 Low back pain: Secondary | ICD-10-CM | POA: Diagnosis not present

## 2019-05-29 ENCOUNTER — Telehealth: Payer: Self-pay | Admitting: Cardiovascular Disease

## 2019-06-01 ENCOUNTER — Other Ambulatory Visit: Payer: Self-pay | Admitting: *Deleted

## 2019-06-01 MED ORDER — ATORVASTATIN CALCIUM 80 MG PO TABS: 80.0000 mg | ORAL_TABLET | Freq: Every day | ORAL | 3 refills | Status: DC

## 2019-06-01 NOTE — Telephone Encounter (Signed)
Atorvastatin refilled.  

## 2019-08-11 ENCOUNTER — Encounter: Payer: Self-pay | Admitting: Gastroenterology

## 2019-09-01 ENCOUNTER — Encounter: Payer: Self-pay | Admitting: Gastroenterology

## 2019-09-01 ENCOUNTER — Other Ambulatory Visit: Payer: Self-pay

## 2019-09-01 ENCOUNTER — Ambulatory Visit (AMBULATORY_SURGERY_CENTER): Payer: Self-pay | Admitting: *Deleted

## 2019-09-01 VITALS — Ht 62.0 in | Wt 136.0 lb

## 2019-09-01 DIAGNOSIS — Z1211 Encounter for screening for malignant neoplasm of colon: Secondary | ICD-10-CM

## 2019-09-01 MED ORDER — SUPREP BOWEL PREP KIT 17.5-3.13-1.6 GM/177ML PO SOLN: 1.0000 | Freq: Once | ORAL | 0 refills | Status: AC

## 2019-09-01 NOTE — Progress Notes (Signed)
Patient denies any allergies to egg or soy products. Patient denies complications with anesthesia/sedation.  Patient denies oxygen use at home and denies diet medications. Emmi instructions for colonoscopy explained and given to patient. Suprep coupon given.

## 2019-09-14 ENCOUNTER — Telehealth: Payer: Self-pay

## 2019-09-14 NOTE — Telephone Encounter (Signed)
Pt responded "no" to all screening questions °

## 2019-09-14 NOTE — Telephone Encounter (Signed)
Covid-19 screening questions   Do you now or have you had a fever in the last 14 days?  Do you have any respiratory symptoms of shortness of breath or cough now or in the last 14 days?  Do you have any family members or close contacts with diagnosed or suspected Covid-19 in the past 14 days?  Have you been tested for Covid-19 and found to be positive?       

## 2019-09-15 ENCOUNTER — Other Ambulatory Visit: Payer: Self-pay

## 2019-09-15 ENCOUNTER — Encounter: Payer: Self-pay | Admitting: Gastroenterology

## 2019-09-15 ENCOUNTER — Ambulatory Visit (AMBULATORY_SURGERY_CENTER): Payer: 59 | Admitting: Gastroenterology

## 2019-09-15 VITALS — BP 123/98 | HR 81 | Temp 98.4°F | Resp 13 | Ht 62.0 in | Wt 136.0 lb

## 2019-09-15 DIAGNOSIS — D122 Benign neoplasm of ascending colon: Secondary | ICD-10-CM | POA: Diagnosis not present

## 2019-09-15 DIAGNOSIS — Z1211 Encounter for screening for malignant neoplasm of colon: Secondary | ICD-10-CM | POA: Diagnosis not present

## 2019-09-15 MED ORDER — SODIUM CHLORIDE 0.9 % IV SOLN: 500.0000 mL | Freq: Once | INTRAVENOUS | Status: DC

## 2019-09-15 NOTE — Progress Notes (Signed)
Called to room to assist during endoscopic procedure.  Patient ID and intended procedure confirmed with present staff. Received instructions for my participation in the procedure from the performing physician.  

## 2019-09-15 NOTE — Op Note (Addendum)
Center Patient Name: Nancy [name] Procedure Date: 09/15/2019 10:14 AM MRN: [id number] Endoscopist: Ladene Artist , MD Age: 53 Referring MD:  Date of Birth: [birth year] Gender: Female Account #: [id number] Procedure:                Colonoscopy Indications:              Screening for colorectal malignant neoplasm Medicines:                Monitored Anesthesia Care Procedure:                Pre-Anesthesia Assessment:                           - Prior to the procedure, a History and Physical                            was performed, and patient medications and                            allergies were reviewed. The patient's tolerance of                            previous anesthesia was also reviewed. The risks                            and benefits of the procedure and the sedation                            options and risks were discussed with the patient.                            All questions were answered, and informed consent                            was obtained. Prior Anticoagulants: The patient has                            taken no previous anticoagulant or antiplatelet                            agents. ASA Grade Assessment: II - A patient with                            mild systemic disease. After reviewing the risks                            and benefits, the patient was deemed in                            satisfactory condition to undergo the procedure.                           After obtaining informed consent, the colonoscope  was passed under direct vision. Throughout the                            procedure, the patient's blood pressure, pulse, and                            oxygen saturations were monitored continuously. The                            Colonoscope was introduced through the anus and                            advanced to the the cecum, identified by                            appendiceal orifice and  ileocecal valve. The                            ileocecal valve, appendiceal orifice, and rectum                            were photographed. The quality of the bowel                            preparation was good. The colonoscopy was performed                            without difficulty. The patient tolerated the                            procedure well. Scope In: 10:19:26 AM Scope Out: 10:34:30 AM Scope Withdrawal Time: 0 hours 10 minutes 51 seconds  Total Procedure Duration: 0 hours 15 minutes 4 seconds  Findings:                 The perianal and digital rectal examinations were                            normal.                           A 7 mm polyp was found in the ascending colon. The                            polyp was sessile. The polyp was removed with a                            cold snare. Resection and retrieval were complete.                           The exam was otherwise without abnormality on                            direct and retroflexion views. Complications:  No immediate complications. Estimated blood loss:                            None. Estimated Blood Loss:     Estimated blood loss: none. Impression:               - One 7 mm polyp in the ascending colon, removed                            with a cold snare. Resected and retrieved.                           - The examination was otherwise normal on direct                            and retroflexion views. Recommendation:           - Repeat colonoscopy date to be determined after                            pending pathology results are reviewed for                            screening / surveillance.                           - Patient has a contact number available for                            emergencies. The signs and symptoms of potential                            delayed complications were discussed with the                            patient. Return to normal activities tomorrow.                             Written discharge instructions were provided to the                            patient.                           - Resume previous diet.                           - Continue present medications.                           - Await pathology results. Ladene Artist, MD 09/15/2019 10:37:11 AM This report has been signed electronically.

## 2019-09-15 NOTE — Patient Instructions (Signed)
   HANDOUT ON POLYPS GIVEN TO YOU TODAY ' AWAIT PATHOLOGY RESULTS ON POLYP REMOVED   YOU HAD AN ENDOSCOPIC PROCEDURE TODAY AT [facility]:   Refer to the procedure report that was given to you for any specific questions about what was found during the examination.  If the procedure report does not answer your questions, please call your gastroenterologist to clarify.  If you requested that your care partner not be given the details of your procedure findings, then the procedure report has been included in a sealed envelope for you to review at your convenience later.  YOU SHOULD EXPECT: Some feelings of bloating in the abdomen. Passage of more gas than usual.  Walking can help get rid of the air that was put into your GI tract during the procedure and reduce the bloating. If you had a lower endoscopy (such as a colonoscopy or flexible sigmoidoscopy) you may notice spotting of blood in your stool or on the toilet paper. If you underwent a bowel prep for your procedure, you may not have a normal bowel movement for a few days.  Please Note:  You might notice some irritation and congestion in your nose or some drainage.  This is from the oxygen used during your procedure.  There is no need for concern and it should clear up in a day or so.  SYMPTOMS TO REPORT IMMEDIATELY:   Following lower endoscopy (colonoscopy or flexible sigmoidoscopy):  Excessive amounts of blood in the stool  Significant tenderness or worsening of abdominal pains  Swelling of the abdomen that is new, acute  Fever of 100F or higher    For urgent or emergent issues, a gastroenterologist can be reached at any hour by calling [phone].   DIET:  We do recommend a small meal at first, but then you may proceed to your regular diet.  Drink plenty of fluids but you should avoid alcoholic beverages for 24 hours.  ACTIVITY:  You should plan to take it easy for the rest of today and you should NOT DRIVE or  use heavy machinery until tomorrow (because of the sedation medicines used during the test).    FOLLOW UP: Our staff will call the number listed on your records 48-72 hours following your procedure to check on you and address any questions or concerns that you may have regarding the information given to you following your procedure. If we do not reach you, we will leave a message.  We will attempt to reach you two times.  During this call, we will ask if you have developed any symptoms of COVID 19. If you develop any symptoms (ie: fever, flu-like symptoms, shortness of breath, cough etc.) before then, please call [phone].  If you test positive for Covid 19 in the 2 weeks post procedure, please call and report this information to [place].    If any biopsies were taken you will be contacted by phone or by letter within the next 1-3 weeks.  Please call us at [phone] if you have not heard about the biopsies in 3 weeks.    SIGNATURES/CONFIDENTIALITY: You and/or your care partner have signed paperwork which will be entered into your electronic medical record.  These signatures attest to the fact that that the information above on your After Visit Summary has been reviewed and is understood.  Full responsibility of the confidentiality of this discharge information lies with you and/or your care-partner.

## 2019-09-15 NOTE — Progress Notes (Signed)
Pt's states no medical or surgical changes since previsit or office visit. 

## 2019-09-15 NOTE — Progress Notes (Signed)
To PACU, VSS. Report to [email or web address] 

## 2019-09-17 ENCOUNTER — Telehealth: Payer: Self-pay

## 2019-09-17 ENCOUNTER — Encounter: Payer: Self-pay | Admitting: Gastroenterology

## 2019-09-17 NOTE — Telephone Encounter (Signed)
  Follow up Call-  Call back number 09/15/2019  Post procedure Call Back phone  # [phone]  Permission to leave phone message Yes  Some recent data might be hidden     Patient questions:  Do you have a fever, pain , or abdominal swelling? No. Pain Score  0 *  Have you tolerated food without any problems? Yes.    Have you been able to return to your normal activities? Yes.    Do you have any questions about your discharge instructions: Diet   No. Medications  No. Follow up visit  No.  Do you have questions or concerns about your Care? No.  Actions: * If pain score is 4 or above: No action needed, pain <4.  1. Have you developed a fever since your procedure? no  2.   Have you had an respiratory symptoms (SOB or cough) since your procedure? no  3.   Have you tested positive for COVID 19 since your procedure no  4.   Have you had any family members/close contacts diagnosed with the COVID 19 since your procedure?  no   If yes to any of these questions please route to Joylene John, RN and Alphonsa Gin, Therapist, sports.

## 2019-09-29 ENCOUNTER — Encounter: Payer: Self-pay | Admitting: Registered Nurse

## 2019-09-29 ENCOUNTER — Telehealth: Payer: Self-pay | Admitting: Registered Nurse

## 2019-09-29 ENCOUNTER — Ambulatory Visit: Payer: 59 | Admitting: Registered Nurse

## 2019-09-29 ENCOUNTER — Ambulatory Visit (INDEPENDENT_AMBULATORY_CARE_PROVIDER_SITE_OTHER): Payer: 59

## 2019-09-29 ENCOUNTER — Other Ambulatory Visit: Payer: Self-pay

## 2019-09-29 VITALS — BP 135/77 | HR 74 | Temp 98.0°F | Ht 63.0 in | Wt 136.0 lb

## 2019-09-29 DIAGNOSIS — F410 Panic disorder [episodic paroxysmal anxiety] without agoraphobia: Secondary | ICD-10-CM | POA: Diagnosis not present

## 2019-09-29 DIAGNOSIS — M25531 Pain in right wrist: Secondary | ICD-10-CM | POA: Diagnosis not present

## 2019-09-29 DIAGNOSIS — M25532 Pain in left wrist: Secondary | ICD-10-CM | POA: Diagnosis not present

## 2019-09-29 DIAGNOSIS — Z7689 Persons encountering health services in other specified circumstances: Secondary | ICD-10-CM | POA: Diagnosis not present

## 2019-09-29 MED ORDER — ALPRAZOLAM 0.25 MG PO TBDP: 0.2500 mg | ORAL_TABLET | Freq: Every evening | ORAL | 0 refills | Status: DC | PRN

## 2019-09-29 NOTE — Progress Notes (Signed)
Established Patient Office Visit  Subjective:  Patient ID: Nancy [name], female    DOB: 1966-06-29  Age: 54 y.o. MRN: [id number]  CC:  Chief Complaint  Patient presents with  . Transitions Of Care    Pt stated having anxiety--used to take meds which [place]  . Hand Pain    Pt stated Rt tip hand --tingling sensation-2 weeks    HPI Nancy [name] presents for visit to establish care.  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 Has used PRN alprazolam in the past at a dose of 0.25 mg PO qd PRN. She notes that she took it maybe 1 or 2 times per week. She is already on Paxil but is unwilling to change due to how well this helps with her hot flashes. She feels that a bridge between now and her retirement at the end of December would be ideal.   R wrist pain: [place] playing tennis about 2 months ago. Notes that there was swelling, pain, and tenderness. She has rested this and used a brace, but unfortunately, There is still swelling and she is having trouble bearing weight on this wrist. She is concerned about the lack of healing.  Also notes numbness in L fingertips. No known history of nm disorder. She is concerned this is related to her cardiovascular health. It waxes and wanes. It is often worst at night.   Past Medical History:  Diagnosis Date  . Anxiety    pt denies this dx  . CAD (coronary artery disease)    1/19 PCI/DESx1 to LAD, and DESx2 to OM, normal EF via LV gram  . Hyperlipidemia   . Hypertension     Past Surgical History:  Procedure Laterality Date  . ABDOMINAL HYSTERECTOMY     patient denies this surgery - patient had an LAVH by Dr Radene Knee  . COLONOSCOPY  2006   [facility]  . CORONARY ANGIOPLASTY WITH STENT PLACEMENT  12/24/2017  . CORONARY BALLOON ANGIOPLASTY N/A 12/24/2017   Procedure: CORONARY BALLOON ANGIOPLASTY;   Surgeon: Leonie Man, MD;  Location: [facility];  Service: Cardiovascular;  Laterality: N/A;  . CORONARY STENT INTERVENTION N/A 12/24/2017   Procedure: CORONARY STENT INTERVENTION;  Surgeon: Leonie Man, MD;  Location: [facility];  Service: Cardiovascular;  Laterality: N/A;  . DIAGNOSTIC LAPAROSCOPY    . ENDOMETRIAL ABLATION W/ NOVASURE    . LAPAROSCOPIC ASSISTED VAGINAL HYSTERECTOMY N/A 07/07/2013   Procedure: LAPAROSCOPIC ASSISTED VAGINAL HYSTERECTOMY;  Surgeon: Darlyn Chamber, MD;  Location: [facility];  Service: Gynecology;  Laterality: N/A;  . RIGHT/LEFT HEART CATH AND CORONARY ANGIOGRAPHY N/A 12/24/2017   Procedure: RIGHT/LEFT HEART CATH AND CORONARY ANGIOGRAPHY;  Surgeon: Leonie Man, MD;  Location: [facility];  Service: Cardiovascular;  Laterality: N/A;  . TONSILLECTOMY    . TUBAL LIGATION     bilateral    Family History  Problem Relation Age of Onset  . Uterine cancer Mother   . Coronary artery disease Father   . Hypertension Father   . Stroke Father   . Heart attack Father   . Heart disease Paternal Uncle   . Heart attack Paternal Uncle   . Diabetes Paternal Grandmother   . Kidney disease Paternal Grandmother   . Colon cancer Neg Hx   . Rectal cancer Neg Hx   .  Stomach cancer Neg Hx     Social History   Socioeconomic History  . Marital status: Married    Spouse name: Not on file  . Number of children: 2  . Years of education: Not on file  . Highest education level: Not on file  Occupational History  . Occupation: Journalist, newspaper  . Financial resource strain: Not hard at all  . Food insecurity    Worry: Never true    Inability: Never true  . Transportation needs    Medical: No    Non-medical: No  Tobacco Use  . Smoking status: Never Smoker  . Smokeless tobacco: Never Used  Substance and Sexual Activity  . Alcohol use: Yes    Alcohol/week: 7.0 - 14.0 standard drinks    Types: 7 - 14 Glasses of wine per week     Comment: 12/24/2017 "1-2 glasses of wine/day"  . Drug use: No  . Sexual activity: Yes    Birth control/protection: Other-see comments    Comment: Hysterectomy - LAVH  Lifestyle  . Physical activity    Days per week: 5 days    Minutes per session: 40 min  . Stress: Not on file  Relationships  . Social Herbalist on phone: Three times a week    Gets together: Twice a week    Attends religious service: Patient refused    Active member of club or organization: Patient refused    Attends meetings of clubs or organizations: Patient refused    Relationship status: Married  . Intimate partner violence    Fear of current or ex partner: No    Emotionally abused: No    Physically abused: No    Forced sexual activity: No  Other Topics Concern  . Not on file  Social History Narrative  . Not on file    Outpatient Medications Prior to Visit  Medication Sig Dispense Refill  . aspirin EC 81 MG tablet Take 1 tablet (81 mg total) by mouth daily. 90 tablet 3  . atorvastatin (LIPITOR) 80 MG tablet Take 1 tablet (80 mg total) by mouth daily. 30 tablet 3  . carvedilol (COREG) 6.25 MG tablet TAKE 1 TABLET BY MOUTH TWICE A DAY 60 tablet 11  . nitroGLYCERIN (NITROSTAT) 0.4 MG SL tablet Place 1 tablet (0.4 mg total) under the tongue every 5 (five) minutes as needed. 25 tablet 2  . PARoxetine (PAXIL) 10 MG tablet paroxetine 10 mg tablet  TAKE 1 TABLET BY MOUTH EVERY DAY    . traZODone (DESYREL) 100 MG tablet Take 1 tablet (100 mg total) by mouth at bedtime as needed for sleep. 90 tablet 1  . methocarbamol (ROBAXIN) 500 MG tablet methocarbamol 500 mg tablet     No facility-administered medications prior to visit.     No Known Allergies  ROS Review of Systems  per hpi   Objective:    Physical Exam  Constitutional: She is oriented to person, place, and time. She appears well-developed and well-nourished. No distress.  Cardiovascular: Normal rate, regular rhythm and normal heart sounds.   Pulmonary/Chest: Effort normal and breath sounds normal. No respiratory distress.  Neurological: She is alert and oriented to person, place, and time.  Skin: Skin is warm and dry. No rash noted. No erythema. No pallor.  Psychiatric: She has a normal mood and affect. Her behavior is normal. Judgment and thought content normal.  Nursing note and vitals reviewed.   BP 135/77 (BP Location: Right Arm,  Patient Position: Sitting, Cuff Size: Normal)   Pulse 74   Temp 98 F (36.7 C)   Ht 5\' 3"  (1.6 m)   Wt 136 lb (61.7 kg)   LMP 04/17/2013   SpO2 98%   BMI 24.09 kg/m  Wt Readings from Last 3 Encounters:  09/29/19 136 lb (61.7 kg)  09/15/19 136 lb (61.7 kg)  09/01/19 136 lb (61.7 kg)     Health Maintenance Due  Topic Date Due  . HIV Screening  12/06/1980  . PAP SMEAR-Modifier  05/17/2018  . MAMMOGRAM  06/14/2018  . INFLUENZA VACCINE  07/04/2019    There are no preventive care reminders to display for this patient.  Lab Results  Component Value Date   TSH 1.43 08/17/2016   Lab Results  Component Value Date   WBC 8.0 12/28/2017   HGB 11.5 (A) 12/28/2017   HCT 34.2 (A) 12/28/2017   MCV 85.5 12/28/2017   PLT 234 12/25/2017   Lab Results  Component Value Date   NA 142 04/09/2018   K 4.6 04/09/2018   CO2 23 04/09/2018   GLUCOSE 85 04/09/2018   BUN 12 04/09/2018   CREATININE 0.79 04/09/2018   BILITOT 1.3 (H) 04/09/2018   ALKPHOS 51 04/09/2018   AST 17 04/09/2018   ALT 18 04/09/2018   PROT 6.7 04/09/2018   ALBUMIN 4.2 04/09/2018   CALCIUM 9.7 04/09/2018   ANIONGAP 13 12/25/2017   GFR 88.51 02/13/2013   Lab Results  Component Value Date   CHOL 124 04/09/2018   Lab Results  Component Value Date   HDL 55 04/09/2018   Lab Results  Component Value Date   LDLCALC 59 04/09/2018   Lab Results  Component Value Date   TRIG 51 04/09/2018   Lab Results  Component Value Date   CHOLHDL 2.3 04/09/2018   Lab Results  Component Value Date   HGBA1C 4.4 08/17/2016       Assessment & Plan:   Problem List Items Addressed This Visit    None    Visit Diagnoses    Right wrist pain    -  Primary   Relevant Orders   DG Wrist Complete Right   Panic attack       Relevant Medications   ALPRAZolam (NIRAVAM) 0.25 MG dissolvable tablet      Meds ordered this encounter  Medications  . ALPRAZolam (NIRAVAM) 0.25 MG dissolvable tablet    Sig: Take 1 tablet (0.25 mg total) by mouth at bedtime as needed for anxiety.    Dispense:  30 tablet    Refill:  0    Order Specific Question:   Supervising Provider    Answer:   Forrest Moron [id number]    Follow-up: No follow-ups on file.   PLAN  Alprazolam 0.25 mg sublingual tablet qd PRN 30 tabs. This will bridge through her retirement, after this, we will change to a different agent if needed  Xray onsite shows mildly displaced healing distal radial fracture. Will refer to orthopedic surgery for ongoing symptoms  Patient encouraged to call clinic with any questions, comments, or concerns.   Maximiano Coss, NP

## 2019-09-29 NOTE — Telephone Encounter (Signed)
Pt's referral for [facility] 09/29/19 was sent to a Pediatrician's office- fax # [phone]. I  will fax over paperwork.

## 2019-10-01 ENCOUNTER — Ambulatory Visit: Payer: 59 | Admitting: Orthopaedic Surgery

## 2019-10-01 ENCOUNTER — Other Ambulatory Visit: Payer: Self-pay

## 2019-10-01 DIAGNOSIS — S52502A Unspecified fracture of the lower end of left radius, initial encounter for closed fracture: Secondary | ICD-10-CM

## 2019-10-01 MED ORDER — TRAMADOL HCL 50 MG PO TABS: 50.0000 mg | ORAL_TABLET | Freq: Four times a day (QID) | ORAL | 1 refills | Status: DC | PRN

## 2019-10-01 MED ORDER — DICLOFENAC SODIUM 1 % TD GEL: 2.0000 g | Freq: Four times a day (QID) | TRANSDERMAL | 1 refills | Status: DC

## 2019-10-01 NOTE — Progress Notes (Signed)
Office Visit Note   Patient: Nancy [name]           Date of Birth: [birth year]           MRN: [id number] Visit Date: 10/01/2019              Requested by: Maximiano Coss, NP [place],  [place] [address] PCP: Maximiano Coss, NP   Assessment & Plan: Visit Diagnoses:  1. Closed fracture of distal end of left radius, unspecified fracture morphology, initial encounter     Plan: impression is healed left distal radius fracture.  Patient still exhibiting some pain, worse at night likely from stiffness.  I have called in tramadol and voltaren gel. We will also start her in hand therapy.  Ambulatory referral put in computer and paper copy given to patient.  Follow up with [place] in 4 weeks for recheck.   Follow-Up Instructions: Return in about 4 weeks (around 10/29/2019).   Orders:  Orders Placed This Encounter  Procedures   Ambulatory referral to Physical Therapy   Meds ordered this encounter  Medications   traMADol (ULTRAM) 50 MG tablet    Sig: Take 1 tablet (50 mg total) by mouth every 6 (six) hours as needed.    Dispense:  30 tablet    Refill:  1   diclofenac sodium (VOLTAREN) 1 % GEL    Sig: Apply 2 g topically 4 (four) times daily.    Dispense:  150 g    Refill:  1      Procedures: No procedures performed   Clinical Data: No additional findings.   Subjective: Chief Complaint  Patient presents with   Right Wrist - Pain    HPI patient is a 53 year old right hand dominant female who is here today with left wrist pain.  She fell onto an outstretched left hand about 6-8 weeks ago while playing tennis.  She initially thought she just sprained her wrist as pain and swelling improved with ice/nsaids and time.  She was still having persistent pain and was seen by primary two days ago where xrays were obtained.  She was subsequently referred to [place].  The pain she is having occurs primarily at night or when putting pressure on to wrist as when she tries to lift   Herself out of a chair.  The pain is to the distal radius.  No numbness, tingling or burning.    Review of Systems as detailed in HPI.  All others reviewed and are negative.    Objective: Vital Signs: LMP 04/17/2013   Physical Exam well developed and well nourished female in no acute distress.    Ortho Exam left wrist exam shows no swelling.  She does have mild tenderness to the distal radius.  She has increased pain and stiffness with wrist flexion and extension.  She is neurovascularly intact distally.   Specialty Comments:  No specialty comments available.  Imaging: xrays reviewed by me in canopy reveal a healed distal radius fracture with slight loss of radial height.  She is also slightly ulnar positive.    PMFS History: Patient Active Problem List   Diagnosis Date Noted   CAD S/P PCI with DES 12/20/2017   Family history of coronary artery disease in father 12/20/2017   Chronic diastolic heart failure ([place]) 12/10/2016   Angina, class III ([place]) 12/10/2016   Status post laparoscopic assisted vaginal hysterectomy (LAVH) 07/07/2013    Class: Status post   Benign essential HTN 11/03/2012  Dyspnea on exertion 07/31/2012   Dyslipidemia 07/30/2012   Depression 07/30/2012   Past Medical History:  Diagnosis Date   Anxiety    pt denies this dx   CAD (coronary artery disease)    1/19 PCI/DESx1 to LAD, and DESx2 to OM, normal EF via LV gram   Hyperlipidemia    Hypertension     Family History  Problem Relation Age of Onset   Uterine cancer Mother    Coronary artery disease Father    Hypertension Father    Stroke Father    Heart attack Father    Heart disease Paternal Uncle    Heart attack Paternal Uncle    Diabetes Paternal Grandmother    Kidney disease Paternal Grandmother    Colon cancer Neg Hx    Rectal cancer Neg Hx    Stomach cancer Neg Hx     Past Surgical History:  Procedure Laterality Date   ABDOMINAL HYSTERECTOMY     patient  denies this surgery - patient had an LAVH by Dr Radene Knee   COLONOSCOPY  2006   [facility]   CORONARY ANGIOPLASTY WITH STENT PLACEMENT  12/24/2017   CORONARY BALLOON ANGIOPLASTY N/A 12/24/2017   Procedure: CORONARY BALLOON ANGIOPLASTY;  Surgeon: Leonie Man, MD;  Location: [facility];  Service: Cardiovascular;  Laterality: N/A;   CORONARY STENT INTERVENTION N/A 12/24/2017   Procedure: CORONARY STENT INTERVENTION;  Surgeon: Leonie Man, MD;  Location: [facility];  Service: Cardiovascular;  Laterality: N/A;   DIAGNOSTIC LAPAROSCOPY     ENDOMETRIAL ABLATION W/ NOVASURE     LAPAROSCOPIC ASSISTED VAGINAL HYSTERECTOMY N/A 07/07/2013   Procedure: LAPAROSCOPIC ASSISTED VAGINAL HYSTERECTOMY;  Surgeon: Darlyn Chamber, MD;  Location: [facility];  Service: Gynecology;  Laterality: N/A;   RIGHT/LEFT HEART CATH AND CORONARY ANGIOGRAPHY N/A 12/24/2017   Procedure: RIGHT/LEFT HEART CATH AND CORONARY ANGIOGRAPHY;  Surgeon: Leonie Man, MD;  Location: [facility];  Service: Cardiovascular;  Laterality: N/A;   TONSILLECTOMY     TUBAL LIGATION     bilateral   Social History   Occupational History   Occupation: Education officer, museum  Tobacco Use   Smoking status: Never Smoker   Smokeless tobacco: Never Used  Substance and Sexual Activity   Alcohol use: Yes    Alcohol/week: 7.0 - 14.0 standard drinks    Types: 7 - 14 Glasses of wine per week    Comment: 12/24/2017 "1-2 glasses of wine/day"   Drug use: No   Sexual activity: Yes    Birth control/protection: Other-see comments    Comment: Hysterectomy - LAVH

## 2019-10-02 ENCOUNTER — Other Ambulatory Visit: Payer: Self-pay | Admitting: Cardiovascular Disease

## 2019-12-12 ENCOUNTER — Other Ambulatory Visit: Payer: Self-pay | Admitting: Cardiovascular Disease

## 2019-12-30 ENCOUNTER — Encounter: Payer: Self-pay | Admitting: Cardiovascular Disease

## 2019-12-30 ENCOUNTER — Other Ambulatory Visit: Payer: Self-pay

## 2019-12-30 ENCOUNTER — Ambulatory Visit: Payer: 59 | Admitting: Cardiovascular Disease

## 2019-12-30 VITALS — BP 118/82 | HR 63 | Ht 63.0 in | Wt 141.6 lb

## 2019-12-30 DIAGNOSIS — I2510 Atherosclerotic heart disease of native coronary artery without angina pectoris: Secondary | ICD-10-CM | POA: Diagnosis not present

## 2019-12-30 DIAGNOSIS — E049 Nontoxic goiter, unspecified: Secondary | ICD-10-CM

## 2019-12-30 LAB — LIPID PANEL
Chol/HDL Ratio: 2 ratio (ref 0.0–4.4)
Cholesterol, Total: 139 mg/dL (ref 100–199)
HDL: 68 mg/dL (ref >39)
LDL Chol Calc (NIH): 60 mg/dL (ref 0–99)
Triglycerides: 52 mg/dL (ref 0–149)
VLDL Cholesterol Cal: 11 mg/dL (ref 5–40)

## 2019-12-30 LAB — TSH: TSH: 1.37 u[IU]/mL (ref 0.450–4.500)

## 2019-12-30 NOTE — Patient Instructions (Signed)
Medication Instructions:  No changes *If you need a refill on your cardiac medications before your next appointment, please call your pharmacy*  Lab Work: Your provider would like for you to have the following labs today: Lipid and TSH  If you have labs (blood work) drawn today and your tests are completely normal, you will receive your results only by: Marland Kitchen MyChart Message (if you have MyChart) OR . A paper copy in the mail If you have any lab test that is abnormal or we need to change your treatment, we will call you to review the results.  Testing/Procedures: None ordered  Follow-Up: At [facility], you and your health needs are our priority.  As part of our continuing mission to provide you with exceptional heart care, we have created designated Provider Care Teams.  These Care Teams include your primary Cardiologist (physician) and Advanced Practice Providers (APPs -  Physician Assistants and Nurse Practitioners) who all work together to provide you with the care you need, when you need it.  Your next appointment:   12 month(s)  The format for your next appointment:   In Person  Provider:   Sanda Klein, MD

## 2019-12-30 NOTE — Progress Notes (Signed)
Cardiology Office Note    Date:  12/31/2019   ID:  Nancy [name], DOB [birth year], MRN [id number]  PCP:  Philis Fendt, [facility]  Cardiologist:   Sanda Klein, MD   No chief complaint on file.   History of Present Illness:  Nancy [name] is a 54 y.o. female with a strong family history of premature coronary artery disease but without any other coronary risk factors, presenting with complaints of exertional dyspnea, found to have coronary artery disease and received drug-eluting stents to the second oblique marginal in the LAD artery in January 2019.  In addition had a high-grade stenosis in the ramus intermedius, but this vessel was too small for PCI.  35% stenosis was seen in the RCA.  Right heart catheterization showed essentially normal pressures.  She feels well and exercises regularly.  The patient specifically denies any chest pain at rest exertion, dyspnea at rest or with exertion, orthopnea, paroxysmal nocturnal dyspnea, syncope, palpitations, focal neurological deficits, intermittent claudication, lower extremity edema, unexplained weight gain, cough, hemoptysis or wheezing.   Echo 2017 showed left ventricular wall thickness and systolic function was normal, but there was evidence of diastolic dysfunction  Her father had heart disease starting in his 26s and has undergone both stents and bypass surgery. Her mother started having coronary problems in her 66s and had an aborted infarction with percutaneous revascularization.   Has a dry cough with ACE inhibitors.    Conclusion CATH 12/24/2017    Prox LAD lesion is 95% stenosed.  A drug-eluting stent was successfully placed using a STENT SYNERGY DES 2.5X28.  Post intervention, there is a 0% residual stenosis.  _____________  Colon Flattery 1st Mrg lesion is 80% stenosed. Ost 1st Mrg to 1st Mrg lesion is 85% stenosed.  2 Overlapping drug-eluting stents were successfully placed using a STENT SYNERGY DES 2.25X16 with a STENT SYNERGY DES  2.25X12 proximally  Post intervention, there is a 0% residual stenosis.  1st Mrg lesion is 90% stenosed. Distal wire dissection  Balloon angioplasty was performed using a BALLOON SAPPHIRE 2.0X15. Post intervention, there is a 0% residual stenosis.  _____________  [name] lesion is 95% stenosed. Too small for PCI  Ost 1st Diag lesion is 50% stenosed.  The left ventricular systolic function is normal. The left ventricular ejection fraction is 55-65% by visual estimate.  Normal RHC Pressures.   Severe 2 vessel PCI - pLAD 99% & ost-p OM1. -- PCI LAD 1 stent, OM1 2 overlapping DES with PTCA distal to stent 2/2 wire dissection. Essentially normal right heart cath pressures with LVEDP and wedge pressure of 11-15 mmHg.  Plan:  She will be monitored overnight for post PCI complications anticipate discharge tomorrow if stable.   DAPT with aspirin and Plavix for minimum 1 year.  Restart home medications, but have increase Crestor to 40 mg daily.  Did not restart Lasix, could potentially give tomorrow versus the day after discharge.      Past Medical History:  Diagnosis Date  . Anxiety    pt denies this dx  . CAD (coronary artery disease)    1/19 PCI/DESx1 to LAD, and DESx2 to OM, normal EF via LV gram  . Hyperlipidemia   . Hypertension     Past Surgical History:  Procedure Laterality Date  . ABDOMINAL HYSTERECTOMY     patient denies this surgery - patient had an LAVH by Dr Radene Knee  . COLONOSCOPY  2006   [facility]  . CORONARY ANGIOPLASTY WITH STENT PLACEMENT  12/24/2017  .  CORONARY BALLOON ANGIOPLASTY N/A 12/24/2017   Procedure: CORONARY BALLOON ANGIOPLASTY;  Surgeon: Leonie Man, MD;  Location: [place] [facility];  Service: Cardiovascular;  Laterality: N/A;  . CORONARY STENT INTERVENTION N/A 12/24/2017   Procedure: CORONARY STENT INTERVENTION;  Surgeon: Leonie Man, MD;  Location: [place] [facility];  Service: Cardiovascular;  Laterality: N/A;  . DIAGNOSTIC  LAPAROSCOPY    . ENDOMETRIAL ABLATION W/ NOVASURE    . LAPAROSCOPIC ASSISTED VAGINAL HYSTERECTOMY N/A 07/07/2013   Procedure: LAPAROSCOPIC ASSISTED VAGINAL HYSTERECTOMY;  Surgeon: Darlyn Chamber, MD;  Location: [place] [facility];  Service: Gynecology;  Laterality: N/A;  . RIGHT/LEFT HEART CATH AND CORONARY ANGIOGRAPHY N/A 12/24/2017   Procedure: RIGHT/LEFT HEART CATH AND CORONARY ANGIOGRAPHY;  Surgeon: Leonie Man, MD;  Location: [place] [facility];  Service: Cardiovascular;  Laterality: N/A;  . TONSILLECTOMY    . TUBAL LIGATION     bilateral    Current Medications: Outpatient Medications Prior to Visit  Medication Sig Dispense Refill  . ALPRAZolam (NIRAVAM) 0.25 MG dissolvable tablet Take 1 tablet (0.25 mg total) by mouth at bedtime as needed for anxiety. 30 tablet 0  . aspirin EC 81 MG tablet Take 1 tablet (81 mg total) by mouth daily. 90 tablet 3  . atorvastatin (LIPITOR) 80 MG tablet TAKE 1 TABLET BY MOUTH EVERY DAY 30 tablet 3  . carvedilol (COREG) 6.25 MG tablet Take 1 tablet (6.25 mg total) by mouth 2 (two) times daily. 60 tablet 11  . nitroGLYCERIN (NITROSTAT) 0.4 MG SL tablet Place 1 tablet (0.4 mg total) under the tongue every 5 (five) minutes as needed. 25 tablet 2  . PARoxetine (PAXIL) 10 MG tablet paroxetine 10 mg tablet  TAKE 1 TABLET BY MOUTH EVERY DAY    . traMADol (ULTRAM) 50 MG tablet Take 1 tablet (50 mg total) by mouth every 6 (six) hours as needed. 30 tablet 1  . traZODone (DESYREL) 100 MG tablet Take 1 tablet (100 mg total) by mouth at bedtime as needed for sleep. 90 tablet 1  . diclofenac sodium (VOLTAREN) 1 % GEL Apply 2 g topically 4 (four) times daily. (Patient not taking: Reported on 12/30/2019) 150 g 1   No facility-administered medications prior to visit.     Allergies:   Patient has no known allergies.   Social History   Socioeconomic History  . Marital status: Married    Spouse name: Not on file  . Number of children: 2  . Years of education: Not on file  .  Highest education level: Not on file  Occupational History  . Occupation: Education officer, museum  Tobacco Use  . Smoking status: Never Smoker  . Smokeless tobacco: Never Used  Substance and Sexual Activity  . Alcohol use: Yes    Alcohol/week: 7.0 - 14.0 standard drinks    Types: 7 - 14 Glasses of wine per week    Comment: 12/24/2017 "1-2 glasses of wine/day"  . Drug use: No  . Sexual activity: Yes    Birth control/protection: Other-see comments    Comment: Hysterectomy - LAVH  Other Topics Concern  . Not on file  Social History Narrative  . Not on file   Social Determinants of Health   Financial Resource Strain: Low Risk   . Difficulty of Paying Living Expenses: Not hard at all  Food Insecurity:   . Worried About Charity fundraiser in the Last Year: Not on file  . Ran Out of Food in the Last Year: Not on  file  Transportation Needs:   . Film/video editor (Medical): Not on file  . Lack of Transportation (Non-Medical): Not on file  Physical Activity:   . Days of Exercise per Week: Not on file  . Minutes of Exercise per Session: Not on file  Stress:   . Feeling of Stress : Not on file  Social Connections: Unknown  . Frequency of Communication with Friends and Family: Three times a week  . Frequency of Social Gatherings with Friends and Family: Twice a week  . Attends Religious Services: Patient refused  . Active Member of Clubs or Organizations: Patient refused  . Attends Archivist Meetings: Patient refused  . Marital Status: Married     Family History:  The patient's family history includes Coronary artery disease in her father; Diabetes in her paternal grandmother; Heart attack in her father and paternal uncle; Heart disease in her paternal uncle; Hypertension in her father; Kidney disease in her paternal grandmother; Stroke in her father; Uterine cancer in her mother.   ROS:   Please see the history of present illness.    All other systems are reviewed and are  negative.   PHYSICAL EXAM:   VS:  BP 118/82   Pulse 63   Ht 5\' 3"  (1.6 m)   Wt 141 lb 9.6 oz (64.2 kg)   LMP 04/17/2013   BMI 25.08 kg/m     General: Alert, oriented x3, no distress, looks fit Head: no evidence of trauma, PERRL, EOMI, no exophtalmos or lid lag, no myxedema, no xanthelasma; normal ears, nose and oropharynx Neck: normal jugular venous pulsations and no hepatojugular reflux; brisk carotid pulses without delay and no carotid bruits. Has a small symmetrical smooth goiter. Chest: clear to auscultation, no signs of consolidation by percussion or palpation, normal fremitus, symmetrical and full respiratory excursions Cardiovascular: normal position and quality of the apical impulse, regular rhythm, normal first and second heart sounds, no murmurs, rubs or gallops Abdomen: no tenderness or distention, no masses by palpation, no abnormal pulsatility or arterial bruits, normal bowel sounds, no hepatosplenomegaly Extremities: no clubbing, cyanosis or edema; 2+ radial, ulnar and brachial pulses bilaterally; 2+ right femoral, posterior tibial and dorsalis pedis pulses; 2+ left femoral, posterior tibial and dorsalis pedis pulses; no subclavian or femoral bruits Neurological: grossly nonfocal Psych: Normal mood and affect   Wt Readings from Last 3 Encounters:  12/30/19 141 lb 9.6 oz (64.2 kg)  09/29/19 136 lb (61.7 kg)  09/15/19 136 lb (61.7 kg)    Studies/Labs Reviewed:   EKG:  EKG is ordered today. It is a normal tracing, NSR. Recent Labs: 12/30/2019: TSH 1.370   Lipid Panel    Component Value Date/Time   CHOL 139 12/30/2019 1113   CHOL 147 02/13/2013 0902   TRIG 52 12/30/2019 1113   TRIG 72 02/13/2013 0902   HDL 68 12/30/2019 1113   HDL 43 02/13/2013 0902   CHOLHDL 2.0 12/30/2019 1113   CHOLHDL 3.1 08/17/2016 0937   VLDL 12 08/17/2016 0937   LDLCALC 60 12/30/2019 1113   LDLCALC 90 02/13/2013 0902    ASSESSMENT:    1. Goiter   2. Coronary artery disease  involving native coronary artery of native heart without angina pectoris      PLAN:   1. CAD: Asymptomatic, despite being very active. 2. HLP: Excellent lipids checked today. 4. HTN: well controlled. 4. Goiter:  Normal TSH.  Medication Adjustments/Labs and Tests Ordered: Current medicines are reviewed at length with the patient  today.  Concerns regarding medicines are outlined above.  Medication changes, Labs and Tests ordered today are listed in the Patient Instructions below. Patient Instructions  Medication Instructions:  No changes *If you need a refill on your cardiac medications before your next appointment, please call your pharmacy*  Lab Work: Your provider would like for you to have the following labs today: Lipid and TSH  If you have labs (blood work) drawn today and your tests are completely normal, you will receive your results only by: Marland Kitchen MyChart Message (if you have MyChart) OR . A paper copy in the mail If you have any lab test that is abnormal or we need to change your treatment, we will call you to review the results.  Testing/Procedures: None ordered  Follow-Up: At [facility], you and your health needs are our priority.  As part of our continuing mission to provide you with exceptional heart care, we have created designated Provider Care Teams.  These Care Teams include your primary Cardiologist (physician) and Advanced Practice Providers (APPs -  Physician Assistants and Nurse Practitioners) who all work together to provide you with the care you need, when you need it.  Your next appointment:   12 month(s)  The format for your next appointment:   In Person  Provider:   Sanda Klein, MD     Signed, Sanda Klein, MD  12/31/2019 4:08 PM    [address], [place], [place]  [address] Phone: [phone]; Fax: [phone]

## 2020-01-04 ENCOUNTER — Telehealth: Payer: Self-pay | Admitting: Cardiovascular Disease

## 2020-01-04 NOTE — Telephone Encounter (Signed)
New Message     Pt is calling for lab results     Please call back

## 2020-01-04 NOTE — Telephone Encounter (Signed)
Croitoru, Dani Gobble, MD 12/31/2019 11:27 AM EST Cholesterol numbers look great. Thyroid test is also normal  Pt informed of providers result & recommendations. Pt verbalized understanding. No further questions .

## 2020-02-07 ENCOUNTER — Other Ambulatory Visit: Payer: Self-pay | Admitting: Cardiovascular Disease

## 2020-04-07 IMAGING — DX DG WRIST COMPLETE 3+V*L*
4 series · 4 of 4 positions shown · non-contrast
Comparison: None.

CLINICAL DATA: Left wrist pain after fall.

EXAM:
LEFT WRIST - COMPLETE 3+ VIEW

[wrist pa]
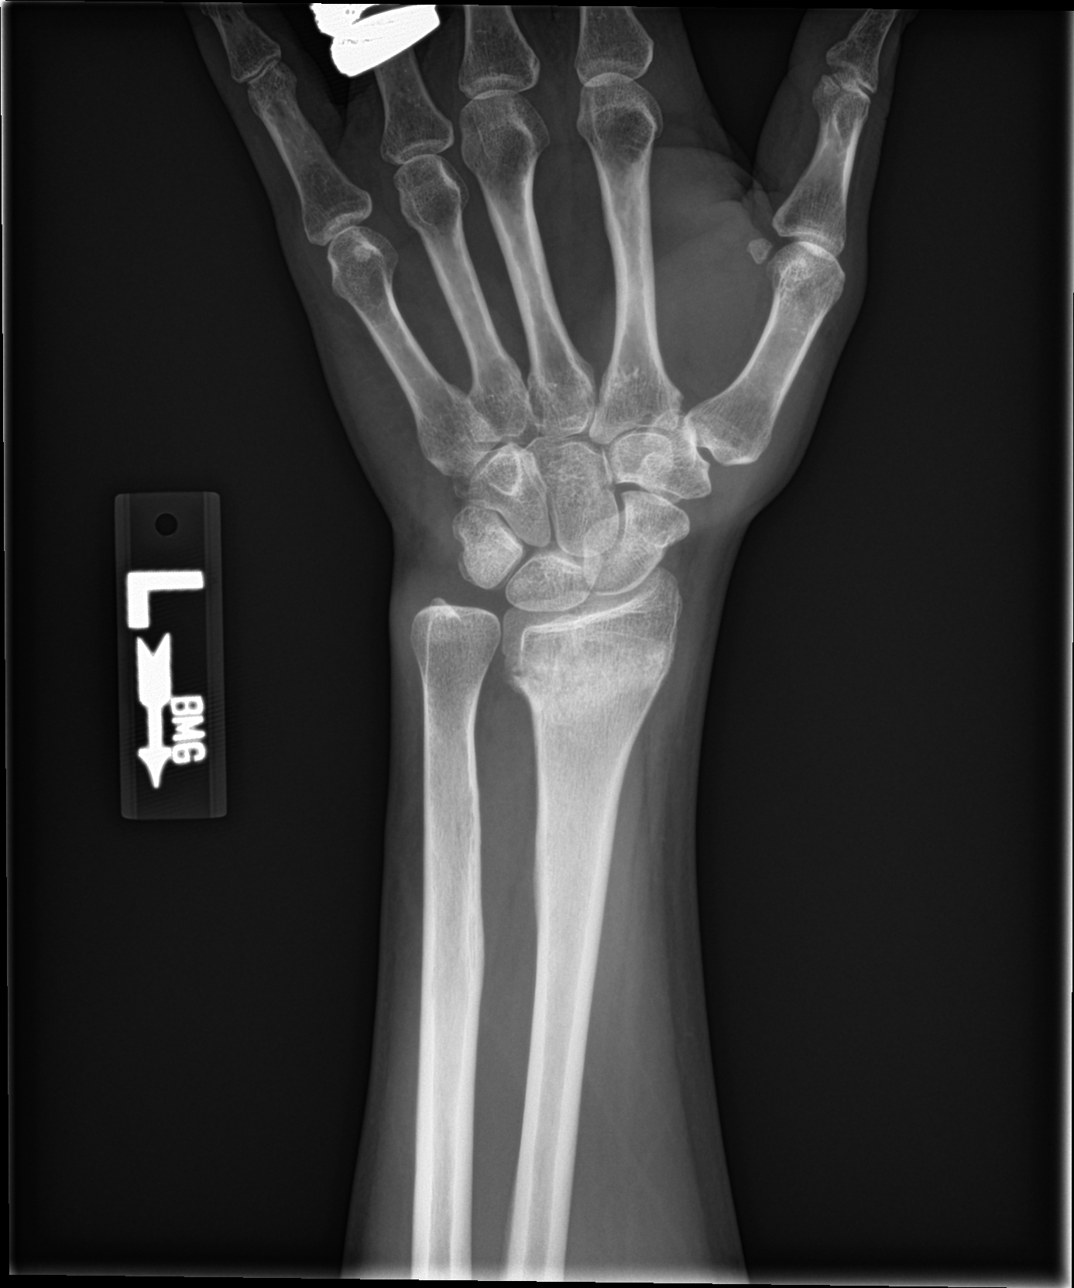

[wrist obl]
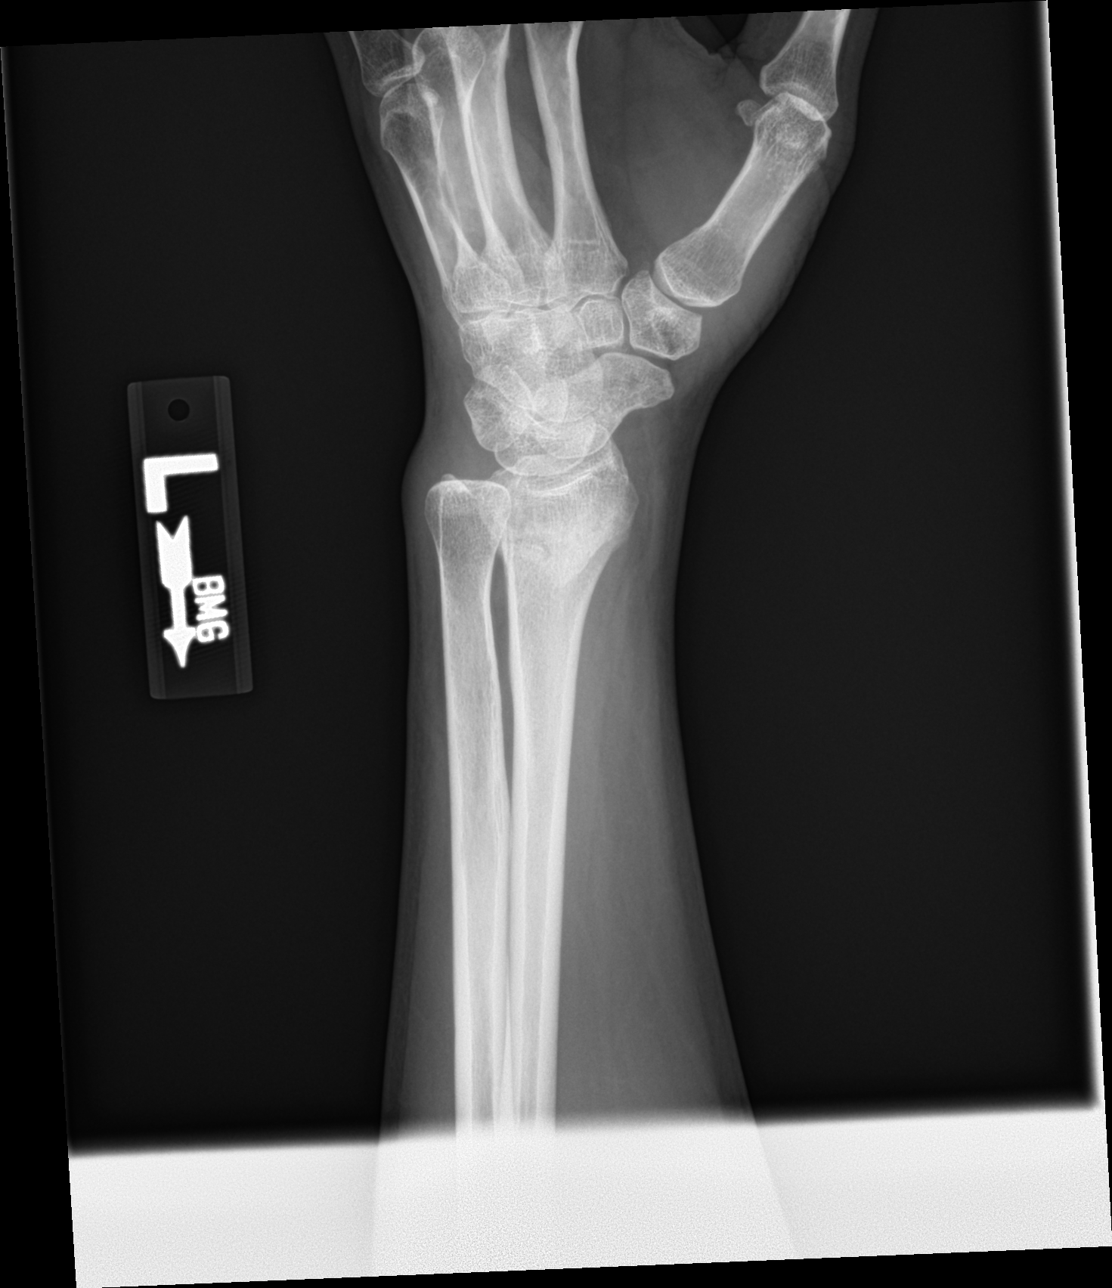

[wrist lat]
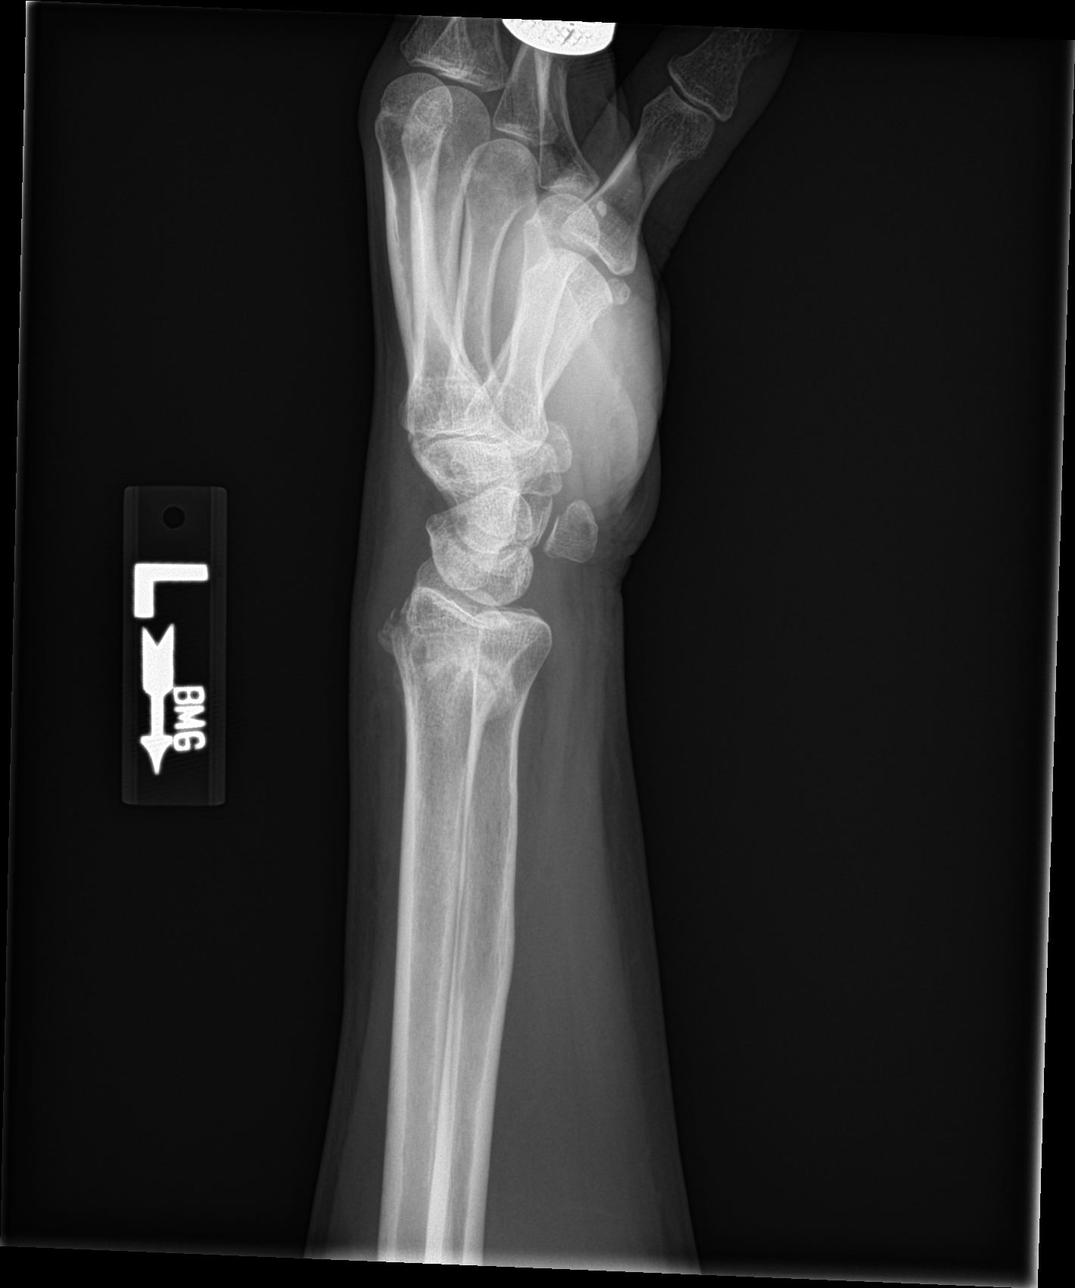

[wrist navicular]
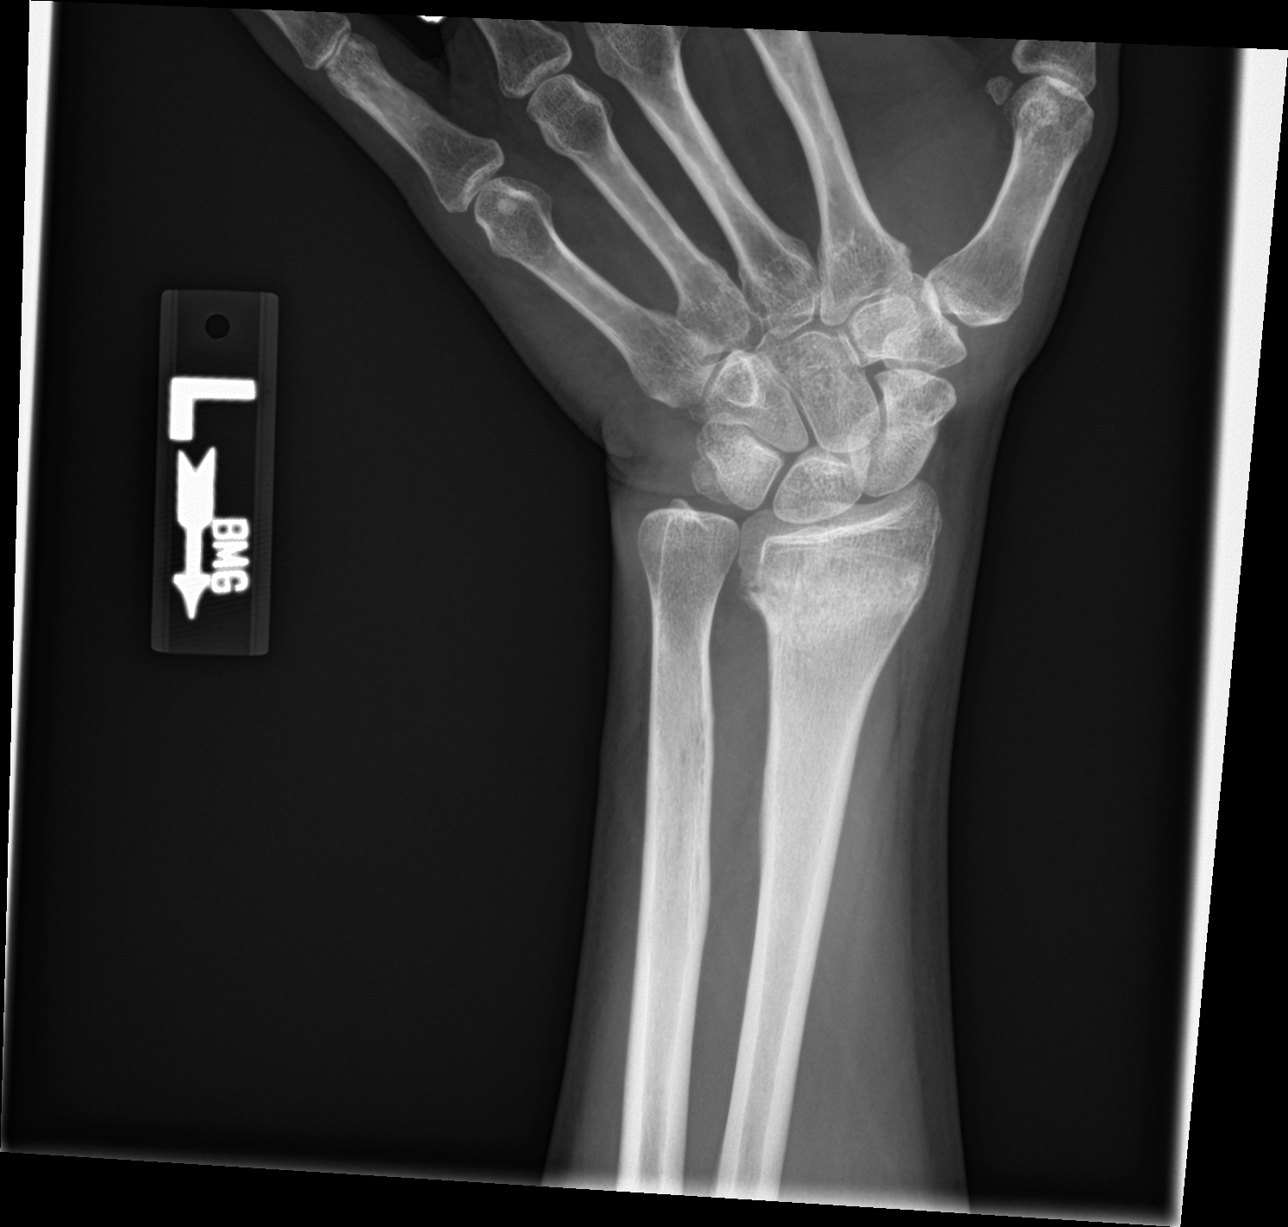

[4 of 4 positions shown; findings below may reference images not displayed]

FINDINGS: There appears to be a minimally displaced fracture involving the
distal left radius with sclerosis and callus formation present
suggesting healing subacute fracture. No other fracture is noted.
Joint spaces are intact.
IMPRESSION: Findings consistent with minimally displaced healing subacute
fracture of the distal left radius.

## 2020-04-19 ENCOUNTER — Ambulatory Visit (INDEPENDENT_AMBULATORY_CARE_PROVIDER_SITE_OTHER): Payer: 59 | Admitting: Registered Nurse

## 2020-04-19 ENCOUNTER — Encounter: Payer: Self-pay | Admitting: Registered Nurse

## 2020-04-19 ENCOUNTER — Other Ambulatory Visit: Payer: Self-pay

## 2020-04-19 VITALS — BP 165/83 | HR 65 | Temp 97.9°F | Resp 17 | Ht 63.0 in | Wt 138.0 lb

## 2020-04-19 DIAGNOSIS — R229 Localized swelling, mass and lump, unspecified: Secondary | ICD-10-CM | POA: Diagnosis not present

## 2020-04-19 NOTE — Progress Notes (Signed)
Established Patient Office Visit  Subjective:  Patient ID: Nancy [name], female    DOB: 08-24-1966  Age: 54 y.o. MRN: [id number]  CC:  Chief Complaint  Patient presents with  . Arm Pain    Patient states she has had a lump on her arm for two years have had it checked out multiple times and was told it is nothing. Per patient now the lump is starting to give her arm and wrist discomfort and arm feels weak.    HPI Nancy [name] presents for lump on L arm  Present for around 2 years. Does not change in size or shape. Generally not painful. No drainage. Somewhat mobile and nontender. No redness, rash, spreading, history of injury. No similar areas in any other location on body  Otherwise no concerns, feeling well.   Past Medical History:  Diagnosis Date  . Anxiety    pt denies this dx  . CAD (coronary artery disease)    1/19 PCI/DESx1 to LAD, and DESx2 to OM, normal EF via LV gram  . Hyperlipidemia   . Hypertension     Past Surgical History:  Procedure Laterality Date  . ABDOMINAL HYSTERECTOMY     patient denies this surgery - patient had an LAVH by Dr Radene Knee  . COLONOSCOPY  2006   [facility]  . CORONARY ANGIOPLASTY WITH STENT PLACEMENT  12/24/2017  . CORONARY BALLOON ANGIOPLASTY N/A 12/24/2017   Procedure: CORONARY BALLOON ANGIOPLASTY;  Surgeon: Leonie Man, MD;  Location: [place] [facility];  Service: Cardiovascular;  Laterality: N/A;  . CORONARY STENT INTERVENTION N/A 12/24/2017   Procedure: CORONARY STENT INTERVENTION;  Surgeon: Leonie Man, MD;  Location: [place] [facility];  Service: Cardiovascular;  Laterality: N/A;  . DIAGNOSTIC LAPAROSCOPY    . ENDOMETRIAL ABLATION W/ NOVASURE    . LAPAROSCOPIC ASSISTED VAGINAL HYSTERECTOMY N/A 07/07/2013   Procedure: LAPAROSCOPIC ASSISTED VAGINAL HYSTERECTOMY;  Surgeon: Darlyn Chamber, MD;  Location: [place] [facility];  Service: Gynecology;  Laterality: N/A;  . RIGHT/LEFT HEART CATH AND CORONARY ANGIOGRAPHY N/A 12/24/2017   Procedure:  RIGHT/LEFT HEART CATH AND CORONARY ANGIOGRAPHY;  Surgeon: Leonie Man, MD;  Location: [place] [facility];  Service: Cardiovascular;  Laterality: N/A;  . TONSILLECTOMY    . TUBAL LIGATION     bilateral    Family History  Problem Relation Age of Onset  . Uterine cancer Mother   . Coronary artery disease Father   . Hypertension Father   . Stroke Father   . Heart attack Father   . Heart disease Paternal Uncle   . Heart attack Paternal Uncle   . Diabetes Paternal Grandmother   . Kidney disease Paternal Grandmother   . Colon cancer Neg Hx   . Rectal cancer Neg Hx   . Stomach cancer Neg Hx     Social History   Socioeconomic History  . Marital status: Married    Spouse name: Not on file  . Number of children: 2  . Years of education: Not on file  . Highest education level: Not on file  Occupational History  . Occupation: Education officer, museum  Tobacco Use  . Smoking status: Never Smoker  . Smokeless tobacco: Never Used  Substance and Sexual Activity  . Alcohol use: Yes    Alcohol/week: 7.0 - 14.0 standard drinks    Types: 7 - 14 Glasses of wine per week    Comment: 12/24/2017 "1-2 glasses of wine/day"  . Drug use: No  . Sexual activity:  Yes    Birth control/protection: Other-see comments    Comment: Hysterectomy - LAVH  Other Topics Concern  . Not on file  Social History Narrative  . Not on file   Social Determinants of Health   Financial Resource Strain: Low Risk   . Difficulty of Paying Living Expenses: Not hard at all  Food Insecurity:   . Worried About Charity fundraiser in the Last Year:   . Arboriculturist in the Last Year:   Transportation Needs:   . Film/video editor (Medical):   Marland Kitchen Lack of Transportation (Non-Medical):   Physical Activity:   . Days of Exercise per Week:   . Minutes of Exercise per Session:   Stress:   . Feeling of Stress :   Social Connections: Unknown  . Frequency of Communication with Friends and Family: Three times a week  .  Frequency of Social Gatherings with Friends and Family: Twice a week  . Attends Religious Services: Patient refused  . Active Member of Clubs or Organizations: Patient refused  . Attends Archivist Meetings: Patient refused  . Marital Status: Married  Human resources officer Violence: Not At Risk  . Fear of Current or Ex-Partner: No  . Emotionally Abused: No  . Physically Abused: No  . Sexually Abused: No    Outpatient Medications Prior to Visit  Medication Sig Dispense Refill  . ALPRAZolam (NIRAVAM) 0.25 MG dissolvable tablet Take 1 tablet (0.25 mg total) by mouth at bedtime as needed for anxiety. 30 tablet 0  . aspirin EC 81 MG tablet Take 1 tablet (81 mg total) by mouth daily. 90 tablet 3  . atorvastatin (LIPITOR) 80 MG tablet TAKE 1 TABLET BY MOUTH EVERY DAY 30 tablet 11  . carvedilol (COREG) 6.25 MG tablet Take 1 tablet (6.25 mg total) by mouth 2 (two) times daily. 60 tablet 11  . nitroGLYCERIN (NITROSTAT) 0.4 MG SL tablet Place 1 tablet (0.4 mg total) under the tongue every 5 (five) minutes as needed. 25 tablet 2  . PARoxetine (PAXIL) 10 MG tablet paroxetine 10 mg tablet  TAKE 1 TABLET BY MOUTH EVERY DAY    . diclofenac sodium (VOLTAREN) 1 % GEL Apply 2 g topically 4 (four) times daily. (Patient not taking: Reported on 12/30/2019) 150 g 1  . traMADol (ULTRAM) 50 MG tablet Take 1 tablet (50 mg total) by mouth every 6 (six) hours as needed. (Patient not taking: Reported on 04/19/2020) 30 tablet 1  . traZODone (DESYREL) 100 MG tablet Take 1 tablet (100 mg total) by mouth at bedtime as needed for sleep. (Patient not taking: Reported on 04/19/2020) 90 tablet 1   No facility-administered medications prior to visit.    No Known Allergies  ROS Review of Systems Per hpi, otherwise negative on 10pt ROS   Objective:    Physical Exam  Constitutional: She is oriented to person, place, and time. She appears well-developed and well-nourished. No distress.  Cardiovascular: Normal rate and  regular rhythm.  Pulmonary/Chest: Effort normal. No respiratory distress.  Neurological: She is alert and oriented to person, place, and time.  Skin: Skin is warm and dry. Lesion noted. No rash noted. She is not diaphoretic. No erythema. No pallor.     Psychiatric: She has a normal mood and affect. Her behavior is normal. Judgment and thought content normal.  Nursing note and vitals reviewed.   BP (!) 165/83   Pulse 65   Temp 97.9 F (36.6 C) (Temporal)   Resp 17  Ht 5\' 3"  (1.6 m)   Wt 138 lb (62.6 kg)   LMP 04/17/2013   SpO2 96%   BMI 24.45 kg/m  Wt Readings from Last 3 Encounters:  04/19/20 138 lb (62.6 kg)  12/30/19 141 lb 9.6 oz (64.2 kg)  09/29/19 136 lb (61.7 kg)     There are no preventive care reminders to display for this patient.  There are no preventive care reminders to display for this patient.  Lab Results  Component Value Date   TSH 1.370 12/30/2019   Lab Results  Component Value Date   WBC 8.0 12/28/2017   HGB 11.5 (A) 12/28/2017   HCT 34.2 (A) 12/28/2017   MCV 85.5 12/28/2017   PLT 234 12/25/2017   Lab Results  Component Value Date   NA 142 04/09/2018   K 4.6 04/09/2018   CO2 23 04/09/2018   GLUCOSE 85 04/09/2018   BUN 12 04/09/2018   CREATININE 0.79 04/09/2018   BILITOT 1.3 (H) 04/09/2018   ALKPHOS 51 04/09/2018   AST 17 04/09/2018   ALT 18 04/09/2018   PROT 6.7 04/09/2018   ALBUMIN 4.2 04/09/2018   CALCIUM 9.7 04/09/2018   ANIONGAP 13 12/25/2017   GFR 88.51 02/13/2013   Lab Results  Component Value Date   CHOL 139 12/30/2019   Lab Results  Component Value Date   HDL 68 12/30/2019   Lab Results  Component Value Date   LDLCALC 60 12/30/2019   Lab Results  Component Value Date   TRIG 52 12/30/2019   Lab Results  Component Value Date   CHOLHDL 2.0 12/30/2019   Lab Results  Component Value Date   HGBA1C 4.4 08/17/2016      Assessment & Plan:   Problem List Items Addressed This Visit    None    Visit Diagnoses     Localized skin mass, lump, or swelling    -  Primary      No orders of the defined types were placed in this encounter.   Follow-up: No follow-ups on file.   PLAN  Lipoma vs cyst, regardless, appears as benign lesion without major concern  Will refer to derm for removal given it's overall size and mobility.  Patient encouraged to call clinic with any questions, comments, or concerns.  Maximiano Coss, NP

## 2020-04-19 NOTE — Patient Instructions (Signed)
° ° ° °  If you have lab work done today you will be contacted with your lab results within the next 2 weeks.  If you have not heard from us then please contact us. The fastest way to get your results is to register for My Chart. ° ° °IF you received an x-ray today, you will receive an invoice from [facility]. Please contact [facility] at [phone] with questions or concerns regarding your invoice.  ° °IF you received labwork today, you will receive an invoice from [facility]. Please contact [facility] at [phone] with questions or concerns regarding your invoice.  ° °Our billing staff will not be able to assist you with questions regarding bills from these companies. ° °You will be contacted with the lab results as soon as they are available. The fastest way to get your results is to activate your My Chart account. Instructions are located on the last page of this paperwork. If you have not heard from us regarding the results in 2 weeks, please contact this office. °  ° ° ° °

## 2020-05-09 ENCOUNTER — Other Ambulatory Visit: Payer: Self-pay

## 2020-05-09 ENCOUNTER — Ambulatory Visit (INDEPENDENT_AMBULATORY_CARE_PROVIDER_SITE_OTHER): Payer: 59 | Admitting: Registered Nurse

## 2020-05-09 ENCOUNTER — Encounter: Payer: Self-pay | Admitting: Registered Nurse

## 2020-05-09 VITALS — BP 164/82 | HR 62 | Temp 98.0°F | Ht 63.0 in | Wt 138.0 lb

## 2020-05-09 DIAGNOSIS — Z13228 Encounter for screening for other metabolic disorders: Secondary | ICD-10-CM | POA: Diagnosis not present

## 2020-05-09 DIAGNOSIS — Z0000 Encounter for general adult medical examination without abnormal findings: Secondary | ICD-10-CM

## 2020-05-09 DIAGNOSIS — Z13220 Encounter for screening for lipoid disorders: Secondary | ICD-10-CM | POA: Diagnosis not present

## 2020-05-09 DIAGNOSIS — Z1329 Encounter for screening for other suspected endocrine disorder: Secondary | ICD-10-CM

## 2020-05-09 DIAGNOSIS — Z130 Encounter for screening for diseases of the blood and blood-forming organs and certain disorders involving the immune mechanism: Secondary | ICD-10-CM

## 2020-05-09 NOTE — Progress Notes (Signed)
Established Patient Office Visit  Subjective:  Patient ID: Nancy [name], female    DOB: 1966-08-10  Age: 54 y.o. MRN: [id number]  CC:  Chief Complaint  Patient presents with  . Annual Exam    no pap    HPI Nancy [name] presents for CPE  BP elevated on arrival. Pt denies CV symptoms: chest pain, shob, doe, dependent edema, claudication, headaches, visual changes.  No complaints, feeling well.  Has been called by Payton Mccallum - will make appt soon.  Follows with cardiology regularly for hx of CHF d/t myocardial ischemia, since resolved, does not need diuretics, heart function is good  Otherwise no notable med hx updates.  Past Medical History:  Diagnosis Date  . Anxiety    pt denies this dx  . CAD (coronary artery disease)    1/19 PCI/DESx1 to LAD, and DESx2 to OM, normal EF via LV gram  . Hyperlipidemia   . Hypertension     Past Surgical History:  Procedure Laterality Date  . ABDOMINAL HYSTERECTOMY     patient denies this surgery - patient had an LAVH by Dr Radene Knee  . COLONOSCOPY  2006   [facility]  . CORONARY ANGIOPLASTY WITH STENT PLACEMENT  12/24/2017  . CORONARY BALLOON ANGIOPLASTY N/A 12/24/2017   Procedure: CORONARY BALLOON ANGIOPLASTY;  Surgeon: Leonie Man, MD;  Location: [facility];  Service: Cardiovascular;  Laterality: N/A;  . CORONARY STENT INTERVENTION N/A 12/24/2017   Procedure: CORONARY STENT INTERVENTION;  Surgeon: Leonie Man, MD;  Location: [facility];  Service: Cardiovascular;  Laterality: N/A;  . DIAGNOSTIC LAPAROSCOPY    . ENDOMETRIAL ABLATION W/ NOVASURE    . LAPAROSCOPIC ASSISTED VAGINAL HYSTERECTOMY N/A 07/07/2013   Procedure: LAPAROSCOPIC ASSISTED VAGINAL HYSTERECTOMY;  Surgeon: Darlyn Chamber, MD;  Location: [facility];  Service: Gynecology;  Laterality: N/A;  . RIGHT/LEFT HEART CATH AND CORONARY ANGIOGRAPHY N/A 12/24/2017   Procedure: RIGHT/LEFT HEART CATH AND CORONARY ANGIOGRAPHY;  Surgeon: Leonie Man, MD;  Location: [facility];  Service: Cardiovascular;  Laterality: N/A;  . TONSILLECTOMY    . TUBAL LIGATION     bilateral    Family History  Problem Relation Age of Onset  . Uterine cancer Mother   . Coronary artery disease Father   . Hypertension Father   . Stroke Father   . Heart attack Father   . Heart disease Paternal Uncle   . Heart attack Paternal Uncle   . Diabetes Paternal Grandmother   . Kidney disease Paternal Grandmother   . Colon cancer Neg Hx   . Rectal cancer Neg Hx   . Stomach cancer Neg Hx     Social History   Socioeconomic History  . Marital status: Married    Spouse name: Not on file  . Number of children: 2  . Years of education: Not on file  . Highest education level: Not on file  Occupational History  . Occupation: Education officer, museum  Tobacco Use  . Smoking status: Never Smoker  . Smokeless tobacco: Never Used  Substance and Sexual Activity  . Alcohol use: Yes    Alcohol/week: 7.0 - 14.0 standard drinks    Types: 7 - 14 Glasses of wine per week    Comment: 12/24/2017 "1-2 glasses of wine/day"  . Drug use: No  . Sexual activity: Yes    Birth control/protection: Other-see comments    Comment: Hysterectomy - LAVH  Other Topics Concern  . Not on file  Social  History Narrative  . Not on file   Social Determinants of Health   Financial Resource Strain: Low Risk   . Difficulty of Paying Living Expenses: Not hard at all  Food Insecurity:   . Worried About Charity fundraiser in the Last Year:   . Arboriculturist in the Last Year:   Transportation Needs:   . Film/video editor (Medical):   Marland Kitchen Lack of Transportation (Non-Medical):   Physical Activity:   . Days of Exercise per Week:   . Minutes of Exercise per Session:   Stress:   . Feeling of Stress :   Social Connections: Unknown  . Frequency of Communication with Friends and Family: Three times a week  . Frequency of Social Gatherings with Friends and Family: Twice a week  . Attends Religious Services:  Patient refused  . Active Member of Clubs or Organizations: Patient refused  . Attends Archivist Meetings: Patient refused  . Marital Status: Married  Human resources officer Violence: Not At Risk  . Fear of Current or Ex-Partner: No  . Emotionally Abused: No  . Physically Abused: No  . Sexually Abused: No    Outpatient Medications Prior to Visit  Medication Sig Dispense Refill  . ALPRAZolam (NIRAVAM) 0.25 MG dissolvable tablet Take 1 tablet (0.25 mg total) by mouth at bedtime as needed for anxiety. 30 tablet 0  . aspirin EC 81 MG tablet Take 1 tablet (81 mg total) by mouth daily. 90 tablet 3  . atorvastatin (LIPITOR) 80 MG tablet TAKE 1 TABLET BY MOUTH EVERY DAY 30 tablet 11  . carvedilol (COREG) 6.25 MG tablet Take 1 tablet (6.25 mg total) by mouth 2 (two) times daily. 60 tablet 11  . nitroGLYCERIN (NITROSTAT) 0.4 MG SL tablet Place 1 tablet (0.4 mg total) under the tongue every 5 (five) minutes as needed. 25 tablet 2  . PARoxetine (PAXIL) 10 MG tablet paroxetine 10 mg tablet  TAKE 1 TABLET BY MOUTH EVERY DAY    . traZODone (DESYREL) 100 MG tablet Take 1 tablet (100 mg total) by mouth at bedtime as needed for sleep. 90 tablet 1  . diclofenac sodium (VOLTAREN) 1 % GEL Apply 2 g topically 4 (four) times daily. (Patient not taking: Reported on 05/09/2020) 150 g 1  . traMADol (ULTRAM) 50 MG tablet Take 1 tablet (50 mg total) by mouth every 6 (six) hours as needed. (Patient not taking: Reported on 05/09/2020) 30 tablet 1   No facility-administered medications prior to visit.    No Known Allergies  ROS Review of Systems  Constitutional: Negative.   HENT: Negative.   Eyes: Negative.   Respiratory: Negative.   Cardiovascular: Negative.   Gastrointestinal: Negative.   Endocrine: Negative.   Genitourinary: Negative.   Musculoskeletal: Negative.   Skin: Negative.   Allergic/Immunologic: Negative.   Neurological: Negative.   Hematological: Negative.   Psychiatric/Behavioral:  Negative.   All other systems reviewed and are negative.     Objective:    Physical Exam Vitals and nursing note reviewed.  Constitutional:      General: She is not in acute distress.    Appearance: Normal appearance. She is normal weight. She is not ill-appearing, toxic-appearing or diaphoretic.  HENT:     Head: Normocephalic and atraumatic.     Right Ear: Tympanic membrane, ear canal and external ear normal. There is no impacted cerumen.     Left Ear: Tympanic membrane, ear canal and external ear normal. There is no impacted cerumen.  Nose: Nose normal. No congestion or rhinorrhea.     Mouth/Throat:     Mouth: Mucous membranes are moist.     Pharynx: Oropharynx is clear. No oropharyngeal exudate or posterior oropharyngeal erythema.  Eyes:     General: No scleral icterus.       Right eye: No discharge.        Left eye: No discharge.     Extraocular Movements: Extraocular movements intact.     Conjunctiva/sclera: Conjunctivae normal.     Pupils: Pupils are equal, round, and reactive to light.  Neck:     Vascular: No carotid bruit.  Cardiovascular:     Rate and Rhythm: Normal rate and regular rhythm.     Pulses: Normal pulses.     Heart sounds: Normal heart sounds. No murmur heard.  No friction rub. No gallop.   Pulmonary:     Effort: Pulmonary effort is normal. No respiratory distress.     Breath sounds: Normal breath sounds. No stridor. No wheezing, rhonchi or rales.  Chest:     Chest wall: No tenderness.  Abdominal:     General: Abdomen is flat. Bowel sounds are normal. There is no distension.     Palpations: Abdomen is soft. There is no mass.     Tenderness: There is no abdominal tenderness. There is no right CVA tenderness, left CVA tenderness, guarding or rebound.     Hernia: No hernia is present.  Genitourinary:    Comments: Deferred by patient Musculoskeletal:        General: No swelling, tenderness, deformity or signs of injury. Normal range of motion.      Cervical back: Normal range of motion and neck supple. No rigidity or tenderness.     Right lower leg: No edema.     Left lower leg: No edema.  Lymphadenopathy:     Cervical: No cervical adenopathy.  Skin:    General: Skin is warm and dry.     Capillary Refill: Capillary refill takes less than 2 seconds.     Coloration: Skin is not jaundiced or pale.     Findings: No bruising, erythema, lesion or rash.  Neurological:     General: No focal deficit present.     Mental Status: She is alert and oriented to person, place, and time. Mental status is at baseline.     Cranial Nerves: No cranial nerve deficit.     Sensory: No sensory deficit.     Motor: No weakness.     Coordination: Coordination normal.     Gait: Gait normal.     Deep Tendon Reflexes: Reflexes normal.  Psychiatric:        Mood and Affect: Mood normal.        Behavior: Behavior normal.        Thought Content: Thought content normal.        Judgment: Judgment normal.     BP (!) 164/82   Pulse 62   Temp 98 F (36.7 C)   Ht 5\' 3"  (1.6 m)   Wt 138 lb (62.6 kg)   LMP 04/17/2013   SpO2 98%   BMI 24.45 kg/m  Wt Readings from Last 3 Encounters:  05/09/20 138 lb (62.6 kg)  04/19/20 138 lb (62.6 kg)  12/30/19 141 lb 9.6 oz (64.2 kg)     Health Maintenance Due  Topic Date Due  . Hepatitis C Screening  Never done  . COVID-19 Vaccine (1) Never done    There are no preventive  care reminders to display for this patient.  Lab Results  Component Value Date   TSH 1.370 12/30/2019   Lab Results  Component Value Date   WBC 8.0 12/28/2017   HGB 11.5 (A) 12/28/2017   HCT 34.2 (A) 12/28/2017   MCV 85.5 12/28/2017   PLT 234 12/25/2017   Lab Results  Component Value Date   NA 142 04/09/2018   K 4.6 04/09/2018   CO2 23 04/09/2018   GLUCOSE 85 04/09/2018   BUN 12 04/09/2018   CREATININE 0.79 04/09/2018   BILITOT 1.3 (H) 04/09/2018   ALKPHOS 51 04/09/2018   AST 17 04/09/2018   ALT 18 04/09/2018   PROT 6.7  04/09/2018   ALBUMIN 4.2 04/09/2018   CALCIUM 9.7 04/09/2018   ANIONGAP 13 12/25/2017   GFR 88.51 02/13/2013   Lab Results  Component Value Date   CHOL 139 12/30/2019   Lab Results  Component Value Date   HDL 68 12/30/2019   Lab Results  Component Value Date   LDLCALC 60 12/30/2019   Lab Results  Component Value Date   TRIG 52 12/30/2019   Lab Results  Component Value Date   CHOLHDL 2.0 12/30/2019   Lab Results  Component Value Date   HGBA1C 4.4 08/17/2016      Assessment & Plan:   Problem List Items Addressed This Visit    None    Visit Diagnoses    Annual physical exam    -  Primary   Screening for endocrine, metabolic and immunity disorder       Relevant Orders   CBC With Differential (Completed)   Comprehensive metabolic panel (Completed)   TSH (Completed)   Hemoglobin A1c (Completed)   Lipid screening       Relevant Orders   Lipid panel (Completed)      No orders of the defined types were placed in this encounter.   Follow-up: No follow-ups on file.   PLAN  Labs collected   Exam unremarkable  Return to clinic annually  Pt reports hx of white coat hypertension. Will continue to check at home and return with elevated readings  Patient encouraged to call clinic with any questions, comments, or concerns.  Maximiano Coss, NP

## 2020-05-09 NOTE — Patient Instructions (Signed)
° ° ° °  If you have lab work done today you will be contacted with your lab results within the next 2 weeks.  If you have not heard from us then please contact us. The fastest way to get your results is to register for My Chart. ° ° °IF you received an x-ray today, you will receive an invoice from [facility]. Please contact [facility] at [phone] with questions or concerns regarding your invoice.  ° °IF you received labwork today, you will receive an invoice from [facility]. Please contact [facility] at [phone] with questions or concerns regarding your invoice.  ° °Our billing staff will not be able to assist you with questions regarding bills from these companies. ° °You will be contacted with the lab results as soon as they are available. The fastest way to get your results is to activate your My Chart account. Instructions are located on the last page of this paperwork. If you have not heard from us regarding the results in 2 weeks, please contact this office. °  ° ° ° °

## 2020-05-10 ENCOUNTER — Encounter: Payer: Self-pay | Admitting: Registered Nurse

## 2020-05-10 LAB — COMPREHENSIVE METABOLIC PANEL
ALT: 21 IU/L (ref 0–32)
AST: 24 IU/L (ref 0–40)
Albumin/Globulin Ratio: 1.5 (ref 1.2–2.2)
Albumin: 4.5 g/dL (ref 3.8–4.9)
Alkaline Phosphatase: 74 IU/L (ref 48–121)
BUN/Creatinine Ratio: 17 (ref 9–23)
BUN: 12 mg/dL (ref 6–24)
Bilirubin Total: 1.3 mg/dL — ABNORMAL HIGH (ref 0.0–1.2)
CO2: 24 mmol/L (ref 20–29)
Calcium: 10 mg/dL (ref 8.7–10.2)
Chloride: 103 mmol/L (ref 96–106)
Creatinine, Ser: 0.69 mg/dL (ref 0.57–1.00)
GFR calc Af Amer: 114 mL/min/{1.73_m2} (ref >59)
GFR calc non Af Amer: 99 mL/min/{1.73_m2} (ref >59)
Globulin, Total: 3.1 g/dL (ref 1.5–4.5)
Glucose: 88 mg/dL (ref 65–99)
Potassium: 4.7 mmol/L (ref 3.5–5.2)
Sodium: 139 mmol/L (ref 134–144)
Total Protein: 7.6 g/dL (ref 6.0–8.5)

## 2020-05-10 LAB — CBC WITH DIFFERENTIAL
Basophils Absolute: 0 10*3/uL (ref 0.0–0.2)
Basos: 1 %
EOS (ABSOLUTE): 0.1 10*3/uL (ref 0.0–0.4)
Eos: 1 %
Hematocrit: 37.9 % (ref 34.0–46.6)
Hemoglobin: 12.3 g/dL (ref 11.1–15.9)
Immature Grans (Abs): 0 10*3/uL (ref 0.0–0.1)
Immature Granulocytes: 0 %
Lymphocytes Absolute: 2.7 10*3/uL (ref 0.7–3.1)
Lymphs: 40 %
MCH: 28.6 pg (ref 26.6–33.0)
MCHC: 32.5 g/dL (ref 31.5–35.7)
MCV: 88 fL (ref 79–97)
Monocytes Absolute: 0.4 10*3/uL (ref 0.1–0.9)
Monocytes: 7 %
Neutrophils Absolute: 3.5 10*3/uL (ref 1.4–7.0)
Neutrophils: 51 %
RBC: 4.3 x10E6/uL (ref 3.77–5.28)
RDW: 12.8 % (ref 11.7–15.4)
WBC: 6.8 10*3/uL (ref 3.4–10.8)

## 2020-05-10 LAB — LIPID PANEL
Chol/HDL Ratio: 2.1 ratio (ref 0.0–4.4)
Cholesterol, Total: 124 mg/dL (ref 100–199)
HDL: 59 mg/dL (ref >39)
LDL Chol Calc (NIH): 53 mg/dL (ref 0–99)
Triglycerides: 52 mg/dL (ref 0–149)
VLDL Cholesterol Cal: 12 mg/dL (ref 5–40)

## 2020-05-10 LAB — TSH: TSH: 1.99 u[IU]/mL (ref 0.450–4.500)

## 2020-05-10 LAB — HEMOGLOBIN A1C
Est. average glucose Bld gHb Est-mCnc: 94 mg/dL
Hgb A1c MFr Bld: 4.9 % (ref 4.8–5.6)

## 2020-05-10 NOTE — Progress Notes (Signed)
Normal results letter, please!  Thanks,  Kathrin Ruddy, NP

## 2020-08-09 ENCOUNTER — Encounter: Payer: Self-pay | Admitting: Registered Nurse

## 2020-08-10 ENCOUNTER — Other Ambulatory Visit: Payer: Self-pay

## 2020-08-10 ENCOUNTER — Ambulatory Visit: Payer: 59 | Admitting: Physician Assistant

## 2020-08-10 ENCOUNTER — Encounter: Payer: Self-pay | Admitting: Physician Assistant

## 2020-08-10 DIAGNOSIS — D1721 Benign lipomatous neoplasm of skin and subcutaneous tissue of right arm: Secondary | ICD-10-CM | POA: Diagnosis not present

## 2020-08-10 NOTE — Progress Notes (Signed)
   New Patient Visit  Subjective  Nancy [name] is a 54 y.o. female who presents for the following: Skin Problem (lump on right arm- x years- no pain, just irritating). Lump under the skin on the right forearm present for years. No pain. Mild itch. She feels it when she rests her arm down. It does seem to vary in size and seems larger at times. No other lumps like this on the body. No one else in family has lumps that she know of.   Objective  Well appearing patient in no apparent distress; mood and affect are within normal limits.   A focused examination was performed including right forearm. Relevant physical exam findings are noted in the Assessment and Plan.  Objective  Right Forearm - Posterior: Soft mobile nodule palpated.  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 Monitoring. She states that it has grown and can bother her when she rests her arm. She is interested in removal before it gets any larger.   Other Related Procedures Ambulatory referral to General Surgery

## 2020-08-16 ENCOUNTER — Other Ambulatory Visit: Payer: Self-pay | Admitting: Cardiovascular Disease

## 2020-08-16 MED ORDER — NITROGLYCERIN 0.4 MG SL SUBL: 0.4000 mg | SUBLINGUAL_TABLET | SUBLINGUAL | 2 refills | Status: DC | PRN

## 2020-08-16 NOTE — Telephone Encounter (Signed)
*  STAT* If patient is at the pharmacy, call can be transferred to refill team.   1. Which medications need to be refilled? (please list name of each medication and dose if known) nitroGLYCERIN (NITROSTAT) 0.4 MG SL tablet  2. Which pharmacy/location (including street and city if local pharmacy) is medication to be sent to? [id number] [place], [place] - [address]  3. Do they need a 30 day or 90 day supply? Patient states she misplaced her medication. She is requesting an additional supply to take as needed. Please assist.

## 2020-08-22 ENCOUNTER — Telehealth: Payer: Self-pay | Admitting: *Deleted

## 2020-08-22 NOTE — Telephone Encounter (Signed)
[facility] at [facility] [place] [phone]

## 2020-09-02 DIAGNOSIS — E785 Hyperlipidemia, unspecified: Secondary | ICD-10-CM | POA: Insufficient documentation

## 2020-09-02 DIAGNOSIS — I10 Essential (primary) hypertension: Secondary | ICD-10-CM | POA: Insufficient documentation

## 2021-01-05 ENCOUNTER — Encounter: Payer: Self-pay | Admitting: Cardiovascular Disease

## 2021-01-05 ENCOUNTER — Other Ambulatory Visit: Payer: Self-pay

## 2021-01-05 ENCOUNTER — Ambulatory Visit: Payer: 59 | Admitting: Cardiovascular Disease

## 2021-01-05 VITALS — BP 130/90 | HR 71 | Ht 63.0 in | Wt 145.0 lb

## 2021-01-05 DIAGNOSIS — I10 Essential (primary) hypertension: Secondary | ICD-10-CM

## 2021-01-05 DIAGNOSIS — E7800 Pure hypercholesterolemia, unspecified: Secondary | ICD-10-CM | POA: Diagnosis not present

## 2021-01-05 DIAGNOSIS — I2510 Atherosclerotic heart disease of native coronary artery without angina pectoris: Secondary | ICD-10-CM

## 2021-01-05 DIAGNOSIS — E049 Nontoxic goiter, unspecified: Secondary | ICD-10-CM

## 2021-01-05 NOTE — Progress Notes (Signed)
Cardiology Office Note    Date:  01/06/2021   ID:  Nancy [name], DOB 09-Feb-1966, MRN [id number]  PCP:  Philis Fendt, [facility]  Cardiologist:   Sanda Klein, MD   Chief Complaint  Patient presents with  . Coronary Artery Disease    History of Present Illness:  Nancy [name] is a 55 y.o. female with a strong family history of premature coronary artery disease but without any other coronary risk factors, presenting with complaints of exertional dyspnea, found to have coronary artery disease and received drug-eluting stents to the second oblique marginal in the LAD artery in January 2019.  In addition had a high-grade stenosis in the ramus intermedius, but this vessel was too small for PCI.  35% stenosis was seen in the RCA.  Right heart catheterization showed essentially normal pressures.  She has been exercising less during the winter months and has gained some weight.  She is now minimally overweight with a BMI of 25.7.  She has been experiencing some perimenopausal symptoms and was started recently on Lexapro.  The patient specifically denies any chest pain at rest exertion, dyspnea at rest or with exertion, orthopnea, paroxysmal nocturnal dyspnea, syncope, palpitations, focal neurological deficits, intermittent claudication, lower extremity edema, unexplained weight gain, cough, hemoptysis or wheezing.  Diastolic blood pressure is borderline high today.  Echo 2017 showed left ventricular wall thickness and systolic function was normal, but there was evidence of diastolic dysfunction  Her father had heart disease starting in his 73s and has undergone both stents and bypass surgery. Her mother started having coronary problems in her 36s and had an aborted infarction with percutaneous revascularization.   Has a dry cough with ACE inhibitors.    Conclusion CATH 12/24/2017    Prox LAD lesion is 95% stenosed.  A drug-eluting stent was successfully placed using a STENT SYNERGY DES  2.5X28.  Post intervention, there is a 0% residual stenosis.  _____________  Colon Flattery 1st Mrg lesion is 80% stenosed. Ost 1st Mrg to 1st Mrg lesion is 85% stenosed.  2 Overlapping drug-eluting stents were successfully placed using a STENT SYNERGY DES 2.25X16 with a STENT SYNERGY DES 2.25X12 proximally  Post intervention, there is a 0% residual stenosis.  1st Mrg lesion is 90% stenosed. Distal wire dissection  Balloon angioplasty was performed using a BALLOON SAPPHIRE 2.0X15. Post intervention, there is a 0% residual stenosis.  _____________  [name] lesion is 95% stenosed. Too small for PCI  Ost 1st Diag lesion is 50% stenosed.  The left ventricular systolic function is normal. The left ventricular ejection fraction is 55-65% by visual estimate.  Normal RHC Pressures.   Severe 2 vessel PCI - pLAD 99% & ost-p OM1. -- PCI LAD 1 stent, OM1 2 overlapping DES with PTCA distal to stent 2/2 wire dissection. Essentially normal right heart cath pressures with LVEDP and wedge pressure of 11-15 mmHg.  Plan:  She will be monitored overnight for post PCI complications anticipate discharge tomorrow if stable.   DAPT with aspirin and Plavix for minimum 1 year.  Restart home medications, but have increase Crestor to 40 mg daily.  Did not restart Lasix, could potentially give tomorrow versus the day after discharge.      Past Medical History:  Diagnosis Date  . Anxiety    pt denies this dx  . CAD (coronary artery disease)    1/19 PCI/DESx1 to LAD, and DESx2 to OM, normal EF via LV gram  . Hyperlipidemia   . Hypertension  Past Surgical History:  Procedure Laterality Date  . ABDOMINAL HYSTERECTOMY     patient denies this surgery - patient had an LAVH by Dr Radene Knee  . COLONOSCOPY  2006   [facility]  . CORONARY ANGIOPLASTY WITH STENT PLACEMENT  12/24/2017  . CORONARY BALLOON ANGIOPLASTY N/A 12/24/2017   Procedure: CORONARY BALLOON ANGIOPLASTY;  Surgeon: Leonie Man, MD;   Location: [place] [facility];  Service: Cardiovascular;  Laterality: N/A;  . CORONARY STENT INTERVENTION N/A 12/24/2017   Procedure: CORONARY STENT INTERVENTION;  Surgeon: Leonie Man, MD;  Location: [place] [facility];  Service: Cardiovascular;  Laterality: N/A;  . DIAGNOSTIC LAPAROSCOPY    . ENDOMETRIAL ABLATION W/ NOVASURE    . LAPAROSCOPIC ASSISTED VAGINAL HYSTERECTOMY N/A 07/07/2013   Procedure: LAPAROSCOPIC ASSISTED VAGINAL HYSTERECTOMY;  Surgeon: Darlyn Chamber, MD;  Location: [facility];  Service: Gynecology;  Laterality: N/A;  . RIGHT/LEFT HEART CATH AND CORONARY ANGIOGRAPHY N/A 12/24/2017   Procedure: RIGHT/LEFT HEART CATH AND CORONARY ANGIOGRAPHY;  Surgeon: Leonie Man, MD;  Location: [place] [facility];  Service: Cardiovascular;  Laterality: N/A;  . TONSILLECTOMY    . TUBAL LIGATION     bilateral    Current Medications: Outpatient Medications Prior to Visit  Medication Sig Dispense Refill  . aspirin EC 81 MG tablet Take 1 tablet (81 mg total) by mouth daily. 90 tablet 3  . atorvastatin (LIPITOR) 80 MG tablet TAKE 1 TABLET BY MOUTH EVERY DAY 30 tablet 11  . carvedilol (COREG) 6.25 MG tablet Take 1 tablet (6.25 mg total) by mouth 2 (two) times daily. 60 tablet 11  . gabapentin (NEURONTIN) 100 MG capsule gabapentin 100 mg capsule  Take 1 capsule every day by oral route at bedtime for 30 days.    . nitroGLYCERIN (NITROSTAT) 0.4 MG SL tablet Place 1 tablet (0.4 mg total) under the tongue every 5 (five) minutes as needed. 25 tablet 2  . venlafaxine XR (EFFEXOR-XR) 37.5 MG 24 hr capsule Take 37.5 mg by mouth daily.    Marland Kitchen ALPRAZolam (NIRAVAM) 0.25 MG dissolvable tablet Take 1 tablet (0.25 mg total) by mouth at bedtime as needed for anxiety. 30 tablet 0  . PARoxetine (PAXIL) 10 MG tablet paroxetine 10 mg tablet  TAKE 1 TABLET BY MOUTH EVERY DAY    . traZODone (DESYREL) 100 MG tablet Take 1 tablet (100 mg total) by mouth at bedtime as needed for sleep. 90 tablet 1   No  facility-administered medications prior to visit.     Allergies:   Patient has no known allergies.   Social History   Socioeconomic History  . Marital status: Married    Spouse name: Not on file  . Number of children: 2  . Years of education: Not on file  . Highest education level: Not on file  Occupational History  . Occupation: Education officer, museum  Tobacco Use  . Smoking status: Never Smoker  . Smokeless tobacco: Never Used  Vaping Use  . Vaping Use: Never used  Substance and Sexual Activity  . Alcohol use: Yes    Alcohol/week: 7.0 - 14.0 standard drinks    Types: 7 - 14 Glasses of wine per week    Comment: 12/24/2017 "1-2 glasses of wine/day"  . Drug use: No  . Sexual activity: Yes    Birth control/protection: Other-see comments    Comment: Hysterectomy - LAVH  Other Topics Concern  . Not on file  Social History Narrative  . Not on file   Social Determinants of  Health   Financial Resource Strain: Not on file  Food Insecurity: Not on file  Transportation Needs: Not on file  Physical Activity: Not on file  Stress: Not on file  Social Connections: Not on file     Family History:  The patient's family history includes Coronary artery disease in her father; Diabetes in her paternal grandmother; Heart attack in her father and paternal uncle; Heart disease in her paternal uncle; Hypertension in her father; Kidney disease in her paternal grandmother; Stroke in her father; Uterine cancer in her mother.   ROS:   Please see the history of present illness.    All other systems are reviewed and are negative.   PHYSICAL EXAM:   VS:  BP 130/90   Pulse 71   Ht 5\' 3"  (1.6 m)   Wt 145 lb (65.8 kg)   LMP 04/17/2013   SpO2 98%   BMI 25.69 kg/m     General: Alert, oriented x3, no distress, appears minimally overweight and fit Head: no evidence of trauma, PERRL, EOMI, no exophtalmos or lid lag, no myxedema, no xanthelasma; normal ears, nose and oropharynx Neck: normal jugular  venous pulsations and no hepatojugular reflux; brisk carotid pulses without delay and no carotid bruits Chest: clear to auscultation, no signs of consolidation by percussion or palpation, normal fremitus, symmetrical and full respiratory excursions Cardiovascular: normal position and quality of the apical impulse, regular rhythm, normal first and second heart sounds, no murmurs, rubs or gallops Abdomen: no tenderness or distention, no masses by palpation, no abnormal pulsatility or arterial bruits, normal bowel sounds, no hepatosplenomegaly Extremities: no clubbing, cyanosis or edema; 2+ radial, ulnar and brachial pulses bilaterally; 2+ right femoral, posterior tibial and dorsalis pedis pulses; 2+ left femoral, posterior tibial and dorsalis pedis pulses; no subclavian or femoral bruits Neurological: grossly nonfocal Psych: Normal mood and affect   Wt Readings from Last 3 Encounters:  01/05/21 145 lb (65.8 kg)  05/09/20 138 lb (62.6 kg)  04/19/20 138 lb (62.6 kg)    Studies/Labs Reviewed:   EKG:  EKG is ordered today.  It is a completely normal tracing, shows normal sinus rhythm Recent Labs: 05/09/2020: ALT 21; BUN 12; Creatinine, Ser 0.69; Hemoglobin 12.3; Potassium 4.7; Sodium 139; TSH 1.990   Lipid Panel    Component Value Date/Time   CHOL 124 05/09/2020 1331   CHOL 147 02/13/2013 0902   TRIG 52 05/09/2020 1331   TRIG 72 02/13/2013 0902   HDL 59 05/09/2020 1331   HDL 43 02/13/2013 0902   CHOLHDL 2.1 05/09/2020 1331   CHOLHDL 3.1 08/17/2016 0937   VLDL 12 08/17/2016 0937   LDLCALC 53 05/09/2020 1331   LDLCALC 90 02/13/2013 0902    ASSESSMENT:    1. Coronary artery disease involving native coronary artery of native heart without angina pectoris   2. Hypercholesterolemia   3. Benign essential HTN   4. Goiter      PLAN:   1. CAD: Asymptomatic.  Encourage her to restart regular physical activity as the weather improves. 2. HLP: LDL cholesterol was 53, target less than  70. 4. HTN: Borderline elevated.  Rather than add more medicines I encouraged her to be more physically active. 4. Goiter: Euthyroid clinically and by most recent labs.  Medication Adjustments/Labs and Tests Ordered: Current medicines are reviewed at length with the patient today.  Concerns regarding medicines are outlined above.  Medication changes, Labs and Tests ordered today are listed in the Patient Instructions below. Patient Instructions  Medication  Instructions:  No changes *If you need a refill on your cardiac medications before your next appointment, please call your pharmacy*   Lab Work: Your provider would like for you to return in 6 months to have the following labs drawn: fasting Lipid. You do not need an appointment for the lab. Once in our office lobby there is a podium where you can sign in and ring the doorbell to alert [place] that you are here. The lab is open from 8:00 am to 4:30 pm; closed for lunch from 12:45pm-1:45pm.  If you have labs (blood work) drawn today and your tests are completely normal, you will receive your results only by: Marland Kitchen MyChart Message (if you have MyChart) OR . A paper copy in the mail If you have any lab test that is abnormal or we need to change your treatment, we will call you to review the results.   Testing/Procedures: None ordered   Follow-Up: At [facility], you and your health needs are our priority.  As part of our continuing mission to provide you with exceptional heart care, we have created designated Provider Care Teams.  These Care Teams include your primary Cardiologist (physician) and Advanced Practice Providers (APPs -  Physician Assistants and Nurse Practitioners) who all work together to provide you with the care you need, when you need it.  We recommend signing up for the patient portal called "MyChart".  Sign up information is provided on this After Visit Summary.  MyChart is used to connect with patients for Virtual Visits  (Telemedicine).  Patients are able to view lab/test results, encounter notes, upcoming appointments, etc.  Non-urgent messages can be sent to your provider as well.   To learn more about what you can do with MyChart, go to [email or web address].    Your next appointment:   12 month(s)  The format for your next appointment:   In Person  Provider:   You may see Sanda Klein, MD or one of the following Advanced Practice Providers on your designated Care Team:    Almyra Deforest, PA-C  Fabian Sharp, [place] or   Roby Lofts, PA-C       Signed, Sanda Klein, MD  01/06/2021 4:53 PM    [facility] [place], [place], [place]  [address] Phone: [phone]; Fax: [phone]

## 2021-01-05 NOTE — Patient Instructions (Signed)
Medication Instructions:  No changes *If you need a refill on your cardiac medications before your next appointment, please call your pharmacy*   Lab Work: Your provider would like for you to return in 6 months to have the following labs drawn: fasting Lipid. You do not need an appointment for the lab. Once in our office lobby there is a podium where you can sign in and ring the doorbell to alert [place] that you are here. The lab is open from 8:00 am to 4:30 pm; closed for lunch from 12:45pm-1:45pm.  If you have labs (blood work) drawn today and your tests are completely normal, you will receive your results only by: Marland Kitchen MyChart Message (if you have MyChart) OR . A paper copy in the mail If you have any lab test that is abnormal or we need to change your treatment, we will call you to review the results.   Testing/Procedures: None ordered   Follow-Up: At [facility], you and your health needs are our priority.  As part of our continuing mission to provide you with exceptional heart care, we have created designated Provider Care Teams.  These Care Teams include your primary Cardiologist (physician) and Advanced Practice Providers (APPs -  Physician Assistants and Nurse Practitioners) who all work together to provide you with the care you need, when you need it.  We recommend signing up for the patient portal called "MyChart".  Sign up information is provided on this After Visit Summary.  MyChart is used to connect with patients for Virtual Visits (Telemedicine).  Patients are able to view lab/test results, encounter notes, upcoming appointments, etc.  Non-urgent messages can be sent to your provider as well.   To learn more about what you can do with MyChart, go to [email or web address].    Your next appointment:   12 month(s)  The format for your next appointment:   In Person  Provider:   You may see Sanda Klein, MD or one of the following Advanced Practice Providers on your  designated Care Team:    Almyra Deforest, PA-C  Fabian Sharp, PA-C or   Roby Lofts, [place]

## 2021-01-06 ENCOUNTER — Encounter: Payer: Self-pay | Admitting: Cardiovascular Disease

## 2021-01-10 ENCOUNTER — Other Ambulatory Visit: Payer: Self-pay | Admitting: Cardiovascular Disease

## 2021-03-18 ENCOUNTER — Other Ambulatory Visit: Payer: Self-pay | Admitting: Cardiovascular Disease

## 2021-08-02 ENCOUNTER — Ambulatory Visit: Payer: 59 | Admitting: Physician Assistant

## 2021-08-24 ENCOUNTER — Encounter: Payer: Self-pay | Admitting: Nurse Practitioner

## 2021-08-24 ENCOUNTER — Ambulatory Visit: Payer: 59 | Admitting: Nurse Practitioner

## 2021-08-24 ENCOUNTER — Other Ambulatory Visit: Payer: Self-pay

## 2021-08-24 VITALS — BP 149/83 | HR 68 | Temp 97.3°F | Ht 63.0 in | Wt 149.0 lb

## 2021-08-24 DIAGNOSIS — Z7689 Persons encountering health services in other specified circumstances: Secondary | ICD-10-CM | POA: Diagnosis not present

## 2021-08-24 DIAGNOSIS — Z13220 Encounter for screening for lipoid disorders: Secondary | ICD-10-CM

## 2021-08-24 DIAGNOSIS — Z131 Encounter for screening for diabetes mellitus: Secondary | ICD-10-CM | POA: Diagnosis not present

## 2021-08-24 DIAGNOSIS — R635 Abnormal weight gain: Secondary | ICD-10-CM

## 2021-08-24 DIAGNOSIS — R82998 Other abnormal findings in urine: Secondary | ICD-10-CM

## 2021-08-24 DIAGNOSIS — I25708 Atherosclerosis of coronary artery bypass graft(s), unspecified, with other forms of angina pectoris: Secondary | ICD-10-CM

## 2021-08-24 DIAGNOSIS — I5032 Chronic diastolic (congestive) heart failure: Secondary | ICD-10-CM

## 2021-08-24 DIAGNOSIS — F419 Anxiety disorder, unspecified: Secondary | ICD-10-CM

## 2021-08-24 DIAGNOSIS — I10 Essential (primary) hypertension: Secondary | ICD-10-CM | POA: Diagnosis not present

## 2021-08-24 DIAGNOSIS — M542 Cervicalgia: Secondary | ICD-10-CM

## 2021-08-24 LAB — POCT GLYCOSYLATED HEMOGLOBIN (HGB A1C): Hemoglobin A1C: 5.1 % (ref 4.0–5.6)

## 2021-08-24 LAB — POCT URINALYSIS DIP (CLINITEK)
Bilirubin, UA: NEGATIVE
Blood, UA: NEGATIVE
Glucose, UA: NEGATIVE mg/dL
Nitrite, UA: NEGATIVE
POC PROTEIN,UA: NEGATIVE
Spec Grav, UA: 1.025 (ref 1.010–1.025)
Urobilinogen, UA: 1 E.U./dL
pH, UA: 7 (ref 5.0–8.0)

## 2021-08-24 LAB — GLUCOSE, POCT (MANUAL RESULT ENTRY): POC Glucose: 137 mg/dl — AB (ref 70–99)

## 2021-08-24 MED ORDER — HYDROXYZINE HCL 10 MG PO TABS: 10.0000 mg | ORAL_TABLET | Freq: Three times a day (TID) | ORAL | 0 refills | Status: DC | PRN

## 2021-08-24 NOTE — Patient Instructions (Signed)
Hypertension, Adult Hypertension is another name for high blood pressure. High blood pressure forces your heart to work harder to pump blood. This can cause problems over time. There are two numbers in a blood pressure reading. There is a top number (systolic) over a bottom number (diastolic). It is best to have a blood pressure that is below 120/80. Healthy choices can help lower your blood pressure, or you may need medicine to help lower it. What are the causes? The cause of this condition is not known. Some conditions may be related to high blood pressure. What increases the risk? Smoking. Having type 2 diabetes mellitus, high cholesterol, or both. Not getting enough exercise or physical activity. Being overweight. Having too much fat, sugar, calories, or salt (sodium) in your diet. Drinking too much alcohol. Having long-term (chronic) kidney disease. Having a family history of high blood pressure. Age. Risk increases with age. Race. You may be at higher risk if you are African American. Gender. Men are at higher risk than women before age 45. After age 65, women are at higher risk than men. Having obstructive sleep apnea. Stress. What are the signs or symptoms? High blood pressure may not cause symptoms. Very high blood pressure (hypertensive crisis) may cause: Headache. Feelings of worry or nervousness (anxiety). Shortness of breath. Nosebleed. A feeling of being sick to your stomach (nausea). Throwing up (vomiting). Changes in how you see. Very bad chest pain. Seizures. How is this treated? This condition is treated by making healthy lifestyle changes, such as: Eating healthy foods. Exercising more. Drinking less alcohol. Your health care provider may prescribe medicine if lifestyle changes are not enough to get your blood pressure under control, and if: Your top number is above 130. Your bottom number is above 80. Your personal target blood pressure may vary. Follow  these instructions at home: Eating and drinking  If told, follow the DASH eating plan. To follow this plan: Fill one half of your plate at each meal with fruits and vegetables. Fill one fourth of your plate at each meal with whole grains. Whole grains include whole-wheat pasta, brown rice, and whole-grain bread. Eat or drink low-fat dairy products, such as skim milk or low-fat yogurt. Fill one fourth of your plate at each meal with low-fat (lean) proteins. Low-fat proteins include fish, chicken without skin, eggs, beans, and tofu. Avoid fatty meat, cured and processed meat, or chicken with skin. Avoid pre-made or processed food. Eat less than 1,500 mg of salt each day. Do not drink alcohol if: Your doctor tells you not to drink. You are pregnant, may be pregnant, or are planning to become pregnant. If you drink alcohol: Limit how much you use to: 0-1 drink a day for women. 0-2 drinks a day for men. Be aware of how much alcohol is in your drink. In the [place] one drink equals one 12 oz bottle of beer (355 mL), one 5 oz glass of wine (148 mL), or one 1 oz glass of hard liquor (44 mL). Lifestyle  Work with your doctor to stay at a healthy weight or to lose weight. Ask your doctor what the best weight is for you. Get at least 30 minutes of exercise most days of the week. This may include walking, swimming, or biking. Get at least 30 minutes of exercise that strengthens your muscles (resistance exercise) at least 3 days a week. This may include lifting weights or doing Pilates. Do not use any products that contain nicotine or tobacco, such   as cigarettes, e-cigarettes, and chewing tobacco. If you need help quitting, ask your doctor. Check your blood pressure at home as told by your doctor. Keep all follow-up visits as told by your doctor. This is important. Medicines Take over-the-counter and prescription medicines only as told by your doctor. Follow directions carefully. Do not skip doses of  blood pressure medicine. The medicine does not work as well if you skip doses. Skipping doses also puts you at risk for problems. Ask your doctor about side effects or reactions to medicines that you should watch for. Contact a doctor if you: Think you are having a reaction to the medicine you are taking. Have headaches that keep coming back (recurring). Feel dizzy. Have swelling in your ankles. Have trouble with your vision. Get help right away if you: Get a very bad headache. Start to feel mixed up (confused). Feel weak or numb. Feel faint. Have very bad pain in your: Chest. Belly (abdomen). Throw up more than once. Have trouble breathing. Summary Hypertension is another name for high blood pressure. High blood pressure forces your heart to work harder to pump blood. For most people, a normal blood pressure is less than 120/80. Making healthy choices can help lower blood pressure. If your blood pressure does not get lower with healthy choices, you may need to take medicine. This information is not intended to replace advice given to you by your health care provider. Make sure you discuss any questions you have with your health care provider. Document Revised: 07/30/2018 Document Reviewed: 07/30/2018 Elsevier Patient Education  2022 [employer], Adult After being diagnosed with an anxiety disorder, you may be relieved to know why you have felt or behaved a certain way. You may also feel overwhelmed about the treatment ahead and what it will mean for your life. With care and support, you can manage this condition and recover from it. How to manage lifestyle changes Managing stress and anxiety Stress is your body's reaction to life changes and events, both good and bad. Most stress will last just a few hours, but stress can be ongoing and can lead to more than just stress. Although stress can play a major role in anxiety, it is not the same as anxiety. Stress is  usually caused by something external, such as a deadline, test, or competition. Stress normally passes after the triggering event has ended.  Anxiety is caused by something internal, such as imagining a terrible outcome or worrying that something will go wrong that will devastate you. Anxiety often does not go away even after the triggering event is over, and it can become long-term (chronic) worry. It is important to understand the differences between stress and anxiety and to manage your stress effectively so that it does not lead to an anxious response. Talk with your health care provider or a counselor to learn more about reducing anxiety and stress. He or she may suggest tension reduction techniques, such as: Music therapy. This can include creating or listening to music that you enjoy and that inspires you. Mindfulness-based meditation. This involves being aware of your normal breaths while not trying to control your breathing. It can be done while sitting or walking. Centering prayer. This involves focusing on a word, phrase, or sacred image that means something to you and brings you peace. Deep breathing. To do this, expand your stomach and inhale slowly through your nose. Hold your breath for 3-5 seconds. Then exhale slowly, letting your stomach muscles relax.  Self-talk. This involves identifying thought patterns that lead to anxiety reactions and changing those patterns. Muscle relaxation. This involves tensing muscles and then relaxing them. Choose a tension reduction technique that suits your lifestyle and personality. These techniques take time and practice. Set aside 5-15 minutes a day to do them. Therapists can offer counseling and training in these techniques. The training to help with anxiety may be covered by some insurance plans. Other things you can do to manage stress and anxiety include: Keeping a stress/anxiety diary. This can help you learn what triggers your reaction and then learn  ways to manage your response. Thinking about how you react to certain situations. You may not be able to control everything, but you can control your response. Making time for activities that help you relax and not feeling guilty about spending your time in this way. Visual imagery and yoga can help you stay calm and relax.  Medicines Medicines can help ease symptoms. Medicines for anxiety include: Anti-anxiety drugs. Antidepressants. Medicines are often used as a primary treatment for anxiety disorder. Medicines will be prescribed by a health care provider. When used together, medicines, psychotherapy, and tension reduction techniques may be the most effective treatment. Relationships Relationships can play a big part in helping you recover. Try to spend more time connecting with trusted friends and family members. Consider going to couples counseling, taking family education classes, or going to family therapy. Therapy can help you and others better understand your condition. How to recognize changes in your anxiety Everyone responds differently to treatment for anxiety. Recovery from anxiety happens when symptoms decrease and stop interfering with your daily activities at home or work. This may mean that you will start to: Have better concentration and focus. Worry will interfere less in your daily thinking. Sleep better. Be less irritable. Have more energy. Have improved memory. It is important to recognize when your condition is getting worse. Contact your health care provider if your symptoms interfere with home or work and you feel like your condition is not improving. Follow these instructions at home: Activity Exercise. Most adults should do the following: Exercise for at least 150 minutes each week. The exercise should increase your heart rate and make you sweat (moderate-intensity exercise). Strengthening exercises at least twice a week. Get the right amount and quality of sleep.  Most adults need 7-9 hours of sleep each night. Lifestyle  Eat a healthy diet that includes plenty of vegetables, fruits, whole grains, low-fat dairy products, and lean protein. Do not eat a lot of foods that are high in solid fats, added sugars, or salt. Make choices that simplify your life. Do not use any products that contain nicotine or tobacco, such as cigarettes, e-cigarettes, and chewing tobacco. If you need help quitting, ask your health care provider. Avoid caffeine, alcohol, and certain over-the-counter cold medicines. These may make you feel worse. Ask your pharmacist which medicines to avoid. General instructions Take over-the-counter and prescription medicines only as told by your health care provider. Keep all follow-up visits as told by your health care provider. This is important. Where to find support You can get help and support from these sources: Self-help groups. Online and [employer]. A trusted spiritual leader. Couples counseling. Family education classes. Family therapy. Where to find more information You may find that joining a support group helps you deal with your anxiety. The following sources can help you locate counselors or support groups near you: [place]: www.mentalhealth[place].net Anxiety and Depression Association  of [place] (ADAA): https://www.clark.net/ National Alliance on Mental Illness (NAMI): www.nami.org Contact a health care provider if you: Have a hard time staying focused or finishing daily tasks. Spend many hours a day feeling worried about everyday life. Become exhausted by worry. Start to have headaches, feel tense, or have nausea. Urinate more than normal. Have diarrhea. Get help right away if you have: A racing heart and shortness of breath. Thoughts of hurting yourself or others. If you ever feel like you may hurt yourself or others, or have thoughts about taking your own life, get help right away. You can go to  your nearest emergency department or call: Your local emergency services (911 in the [place] A suicide crisis helpline, such as the [facility] at (947)369-8525. This is open 24 hours a day. Summary Taking steps to learn and use tension reduction techniques can help calm you and help prevent triggering an anxiety reaction. When used together, medicines, psychotherapy, and tension reduction techniques may be the most effective treatment. Family, friends, and partners can play a big part in helping you recover from an anxiety disorder. This information is not intended to replace advice given to you by your health care provider. Make sure you discuss any questions you have with your health care provider. Document Revised: 04/21/2019 Document Reviewed: 04/21/2019 Elsevier Patient Education  [place]  Arthritis Arthritis means joint pain. It can also mean joint disease. A joint is a place where bones come together. There are more than 100 types of arthritis. What are the causes? This condition may be caused by: Wear and tear of a joint. This is the most common cause. A lot of acid in the blood, which leads to pain in the joint (gout). Pain and swelling (inflammation) in a joint. Infection of a joint. Injuries in the joint. A reaction to medicines (allergy). In some cases, the cause may not be known. What are the signs or symptoms? Symptoms of this condition include: Redness at a joint. Swelling at a joint. Stiffness at a joint. Warmth coming from the joint. A fever. A feeling of being sick. How is this treated? This condition may be treated with: Treating the cause, if it is known. Rest. Raising (elevating) the joint. Putting cold or hot packs on the joint. Medicines to treat symptoms and reduce pain and swelling. Shots of medicines (cortisone) into the joint. You may also be told to make changes in your life, such as doing exercises and losing  weight. Follow these instructions at home: Medicines Take over-the-counter and prescription medicines only as told by your doctor. Do not take aspirin for pain if your doctor says that you may have gout. Activity Rest your joint if your doctor tells you to. Avoid activities that make the pain worse. Exercise your joint regularly as told by your doctor. Try doing exercises like: Swimming. Water aerobics. Biking. Walking. Managing pain, stiffness, and swelling   If told, put ice on the affected area. Put ice in a plastic bag. Place a towel between your skin and the bag. Leave the ice on for 20 minutes, 2-3 times per day. If your joint is swollen, raise (elevate) it above the level of your heart if told by your doctor. If your joint feels stiff in the morning, try taking a warm shower. If told, put heat on the affected area. Do this as often as told by your doctor. Use the heat source that your doctor recommends, such as a moist heat pack  or a heating pad. If you have diabetes, do not apply heat without asking your doctor. To apply heat: Place a towel between your skin and the heat source. Leave the heat on for 20-30 minutes. Remove the heat if your skin turns bright red. This is very important if you are unable to feel pain, heat, or cold. You may have a greater risk of getting burned. General instructions Do not use any products that contain nicotine or tobacco, such as cigarettes, e-cigarettes, and chewing tobacco. If you need help quitting, ask your doctor. Keep all follow-up visits as told by your doctor. This is important. Contact a doctor if: The pain gets worse. You have a fever. Get help right away if: You have very bad pain in your joint. You have swelling in your joint. Your joint is red. Many joints become painful and swollen. You have very bad back pain. Your leg is very weak. You cannot control your pee (urine) or poop (stool). Summary Arthritis means joint pain. It  can also mean joint disease. A joint is a place where bones come together. The most common cause of this condition is wear and tear of a joint. Symptoms of this condition include redness, swelling, or stiffness of the joint. This condition is treated with rest, raising the joint, medicines, and putting cold or hot packs on the joint. Follow your doctor's instructions about medicines, activity, exercises, and other home care treatments. This information is not intended to replace advice given to you by your health care provider. Make sure you discuss any questions you have with your health care provider. Document Revised: 10/27/2018 Document Reviewed: 10/27/2018 Elsevier Patient Education  2022 [employer].

## 2021-08-24 NOTE — Progress Notes (Signed)
[place], [place]  [address] Phone:  [phone]   Fax:  [phone]  New Patient Office Visit  Subjective:  Patient ID: Nancy [name], female    DOB: [birth year]  Age: 55 y.o. MRN: [id number]  CC:  Chief Complaint  Patient presents with   Establish Care    Muscle ache and tension, neck and shoulder. Inflammation lower tailbone.    Anxiety    HPI Nancy [name] presents for establish care. She  has a past medical history of Anxiety, CAD (coronary artery disease), Hyperlipidemia, and Hypertension.   She does follow cardiology for her CAD and heart failure. She is SP stent placements. She is currently being managed with Carvedilol. She reports doing well. She does walk some.   She has some   Joint/Muscle Pain Patient complains of myalgias, which have/has been present for several months. Pain is located in multiple joints and both shoulder(s). The pain is described as intermittent, aching. Associated symptoms include: none. The patient has  massage . Related to injury: no. She reports that her tailbone pain has been ongoing with pain that comes and goes. She feels like it is getting worse. She is having to push herself.    Anxiety Patient is here for evaluation of anxiety.  She has the following anxiety symptoms: feeling overwhelmed.  She is a caregiver for her parents.Onset of symptoms was approximately several years ago.  Symptoms have been stable since that time. She denies current suicidal and homicidal ideation. Possible organic causes contributing are: none. Previous treatment includes medication Celexa, Effexor, Paxil, Xanax, clonazepam and trazodone.   She complains of the following medication side effects: not effective..She has support of her spouse.  Some days are worse than others.  Some days she does well and has no anxiety.   She has gained weight. She reports she has made some changes with her diet. She is unable to loose one  pound.  She is status post vaginal hysterectomy in 2014.  She reports that ideal 135 pound would be comfortable at 140 pound. She is retired in 2021. She does not consume sweets. She is drinking 2 bottles per day.  Past Medical History:  Diagnosis Date   Anxiety    pt denies this dx   CAD (coronary artery disease)    1/19 PCI/DESx1 to LAD, and DESx2 to OM, normal EF via LV gram   Hyperlipidemia    Hypertension     Past Surgical History:  Procedure Laterality Date   ABDOMINAL HYSTERECTOMY     patient denies this surgery - patient had an LAVH by Dr Radene Knee   COLONOSCOPY  2006   [facility]   CORONARY ANGIOPLASTY WITH STENT PLACEMENT  12/24/2017   CORONARY BALLOON ANGIOPLASTY N/A 12/24/2017   Procedure: CORONARY BALLOON ANGIOPLASTY;  Surgeon: Leonie Man, MD;  Location: [facility];  Service: Cardiovascular;  Laterality: N/A;   CORONARY STENT INTERVENTION N/A 12/24/2017   Procedure: CORONARY STENT INTERVENTION;  Surgeon: Leonie Man, MD;  Location: [facility];  Service: Cardiovascular;  Laterality: N/A;   DIAGNOSTIC LAPAROSCOPY     ENDOMETRIAL ABLATION W/ NOVASURE     LAPAROSCOPIC ASSISTED VAGINAL HYSTERECTOMY N/A 07/07/2013   Procedure: LAPAROSCOPIC ASSISTED VAGINAL HYSTERECTOMY;  Surgeon: Darlyn Chamber, MD;  Location: [facility];  Service: Gynecology;  Laterality: N/A;   RIGHT/LEFT HEART CATH AND CORONARY ANGIOGRAPHY N/A 12/24/2017   Procedure: RIGHT/LEFT HEART CATH AND CORONARY ANGIOGRAPHY;  Surgeon: Leonie Man, MD;  Location: [facility];  Service: Cardiovascular;  Laterality: N/A;   TONSILLECTOMY     TUBAL LIGATION     bilateral    Family History  Problem Relation Age of Onset   Uterine cancer Mother    Coronary artery disease Father    Hypertension Father    Stroke Father    Heart attack Father    Heart disease Paternal Uncle    Heart attack Paternal Uncle    Diabetes Paternal Grandmother    Kidney disease Paternal Grandmother    Colon cancer Neg  Hx    Rectal cancer Neg Hx    Stomach cancer Neg Hx     Social History   Socioeconomic History   Marital status: Married    Spouse name: Not on file   Number of children: 2   Years of education: Not on file   Highest education level: Not on file  Occupational History   Occupation: Education officer, museum  Tobacco Use   Smoking status: Never   Smokeless tobacco: Never  Vaping Use   Vaping Use: Never used  Substance and Sexual Activity   Alcohol use: Yes    Alcohol/week: 7.0 - 14.0 standard drinks    Types: 7 - 14 Glasses of wine per week    Comment: socially drinks wine.   Drug use: No   Sexual activity: Yes    Birth control/protection: Other-see comments    Comment: Hysterectomy - LAVH  Other Topics Concern   Not on file  Social History Narrative   Not on file   Social Determinants of Health   Financial Resource Strain: Not on file  Food Insecurity: Not on file  Transportation Needs: Not on file  Physical Activity: Not on file  Stress: Not on file  Social Connections: Not on file  Intimate Partner Violence: Not on file    ROS Review of Systems  Objective:   Today's Vitals: BP (!) 149/83 (BP Location: Right Arm, Patient Position: Sitting)   Pulse 68   Temp (!) 97.3 F (36.3 C)   Ht 5\' 3"  (1.6 m)   Wt 149 lb 0.2 oz (67.6 kg)   LMP 04/17/2013   SpO2 99%   BMI 26.40 kg/m   Physical Exam Constitutional:      Appearance: She is normal weight.  HENT:     Head: Normocephalic and atraumatic.     Right Ear: Tympanic membrane normal.     Left Ear: Tympanic membrane normal.     Nose: Nose normal.     Mouth/Throat:     Mouth: Mucous membranes are moist.  Cardiovascular:     Rate and Rhythm: Normal rate and regular rhythm.     Pulses: Normal pulses.     Heart sounds: Normal heart sounds.  Pulmonary:     Effort: Pulmonary effort is normal.     Breath sounds: Normal breath sounds.  Abdominal:     General: Bowel sounds are normal.     Palpations: Abdomen is soft.   Musculoskeletal:        General: Normal range of motion.     Cervical back: Normal range of motion. No tenderness.  Skin:    General: Skin is warm and dry.     Capillary Refill: Capillary refill takes less than 2 seconds.  Neurological:     General: No focal deficit present.     Mental Status: She is alert and oriented to person, place, and time.  Psychiatric:        Mood and Affect: Mood normal.        Behavior: Behavior normal.        Thought Content: Thought content normal.        Judgment: Judgment normal.    Assessment & Plan:   Problem List Items Addressed This Visit       Cardiovascular and Mediastinum   Chronic diastolic heart failure (HCC) (Chronic) Stable continue to follow-up with cardiology   Benign essential HTN Persistent patient reports whitecoat syndrome  currently on carvedilol 6.25 mg twice daily   Relevant Orders   POCT URINALYSIS DIP (CLINITEK) (Completed)   Comp. Metabolic Panel (12)   Other Visit Diagnoses     Encounter to establish care    -  Primary .Discussed female health maintenance; SBE, annual CBE, PAP test Discussed general safety in vehicle and COVID Discussed regular hydration with water Discussed healthy diet and exercise and weight management Discussed sexual health  Discussed mental health Encouraged to call our office for an appointment with in ongoing concerns for questions.      Coronary artery disease of bypass graft of native heart with stable angina pectoris (HCC)   (Chronic)     Screening for diabetes mellitus       Relevant Orders   Glucose (CBG) (Completed)   HgB A1c (Completed)   Anxiety     Persistent Will trial hydroxyzine 10 mg up to 3 times   Relevant Medications   hydrOXYzine (ATARAX/VISTARIL) 10 MG tablet   Urine white blood cells increased       Relevant Orders   Urine Culture   Screening for cholesterol level       Relevant Orders   Lipid panel   Weight gain   Currently in overweight status BMI  26 Encourage increasing hydration at least 64 ounces of water /day and more to stimulate weight  loss Discussed proper diet (low fat, low sodium, high fiber) with patient.   Discussed need for regular exercise (5 times per week, 30 minutes per session) with patient.    Relevant Orders   TSH   Pain in neck     Persistent May be related to anxiety Continue massage,, Tylenol, Salonpas patches Images if persistent no relief   Relevant Orders   Sedimentation rate       Outpatient Encounter Medications as of 08/24/2021  Medication Sig   aspirin EC 81 MG tablet Take 1 tablet (81 mg total) by mouth daily.   atorvastatin (LIPITOR) 80 MG tablet TAKE 1 TABLET BY MOUTH EVERY DAY   carvedilol (COREG) 6.25 MG tablet TAKE 1 TABLET BY MOUTH TWICE A DAY   hydrOXYzine (ATARAX/VISTARIL) 10 MG tablet Take 1 tablet (10 mg total) by mouth 3 (three) times daily as needed.   nitroGLYCERIN (NITROSTAT) 0.4 MG SL tablet Place 1 tablet (0.4 mg total) under the tongue every 5 (five) minutes as needed.   [DISCONTINUED] atorvastatin (LIPITOR) 80 MG tablet Take 1 tablet by mouth daily.   [DISCONTINUED] gabapentin (NEURONTIN) 100 MG capsule gabapentin 100 mg capsule  Take 1 capsule every day by oral route at bedtime for 30 days.   [DISCONTINUED] venlafaxine XR (EFFEXOR-XR) 37.5 MG 24 hr capsule Take 37.5 mg by mouth daily.   No facility-administered encounter medications on file as of 08/24/2021.    Follow-up: Return in about 3 months (around 11/23/2021) for Follow-up for weight management [id number].   Vevelyn Francois, NP

## 2021-08-25 ENCOUNTER — Encounter: Payer: Self-pay | Admitting: *Deleted

## 2021-08-25 LAB — COMP. METABOLIC PANEL (12)
AST: 22 IU/L (ref 0–40)
Albumin/Globulin Ratio: 1.5 (ref 1.2–2.2)
Albumin: 4.5 g/dL (ref 3.8–4.9)
Alkaline Phosphatase: 86 IU/L (ref 44–121)
BUN/Creatinine Ratio: 13 (ref 9–23)
BUN: 11 mg/dL (ref 6–24)
Bilirubin Total: 1.2 mg/dL (ref 0.0–1.2)
Calcium: 10.1 mg/dL (ref 8.7–10.2)
Chloride: 103 mmol/L (ref 96–106)
Creatinine, Ser: 0.83 mg/dL (ref 0.57–1.00)
Globulin, Total: 3 g/dL (ref 1.5–4.5)
Glucose: 89 mg/dL (ref 65–99)
Potassium: 4.3 mmol/L (ref 3.5–5.2)
Sodium: 141 mmol/L (ref 134–144)
Total Protein: 7.5 g/dL (ref 6.0–8.5)
eGFR: 83 mL/min/{1.73_m2} (ref >59)

## 2021-08-25 LAB — LIPID PANEL
Chol/HDL Ratio: 2.5 ratio (ref 0.0–4.4)
Cholesterol, Total: 160 mg/dL (ref 100–199)
HDL: 64 mg/dL (ref >39)
LDL Chol Calc (NIH): 79 mg/dL (ref 0–99)
Triglycerides: 95 mg/dL (ref 0–149)
VLDL Cholesterol Cal: 17 mg/dL (ref 5–40)

## 2021-08-25 LAB — SEDIMENTATION RATE: Sed Rate: 11 mm/hr (ref 0–40)

## 2021-08-25 LAB — TSH: TSH: 2.85 u[IU]/mL (ref 0.450–4.500)

## 2021-08-29 LAB — URINE CULTURE

## 2021-11-24 ENCOUNTER — Ambulatory Visit: Payer: 59 | Admitting: Nurse Practitioner

## 2021-11-24 ENCOUNTER — Other Ambulatory Visit: Payer: Self-pay

## 2021-11-24 ENCOUNTER — Encounter: Payer: Self-pay | Admitting: Nurse Practitioner

## 2021-11-24 VITALS — BP 132/82 | HR 68 | Temp 97.9°F | Ht 63.0 in | Wt 151.4 lb

## 2021-11-24 DIAGNOSIS — F419 Anxiety disorder, unspecified: Secondary | ICD-10-CM | POA: Diagnosis not present

## 2021-11-24 DIAGNOSIS — N898 Other specified noninflammatory disorders of vagina: Secondary | ICD-10-CM

## 2021-11-24 DIAGNOSIS — D1721 Benign lipomatous neoplasm of skin and subcutaneous tissue of right arm: Secondary | ICD-10-CM | POA: Diagnosis not present

## 2021-11-24 DIAGNOSIS — M79672 Pain in left foot: Secondary | ICD-10-CM | POA: Diagnosis not present

## 2021-11-24 MED ORDER — HYDROXYZINE PAMOATE 25 MG PO CAPS: 25.0000 mg | ORAL_CAPSULE | Freq: Three times a day (TID) | ORAL | 2 refills | Status: DC | PRN

## 2021-11-24 MED ORDER — FLUCONAZOLE 150 MG PO TABS: 150.0000 mg | ORAL_TABLET | Freq: Every day | ORAL | 0 refills | Status: AC

## 2021-11-24 NOTE — Progress Notes (Signed)
[place], [place]  [address] Phone:  [phone]   Fax:  [phone]  Established Patient Office Visit  Subjective:  Patient ID: Nancy [name], female    DOB: 1966/10/19  Age: 55 y.o. MRN: [id number]  CC:  Chief Complaint  Patient presents with   Follow-up    Pt is here today for her follow up.  Pt states that she has a bump on her right arm. Pt also been having a tingling sensation in her left foot. Pt states that she has been having some discomfort/burning sensation off and on for a couple of months in her vagina area, but when she uses vaginal cream it subsides. Pt states that her last ob/gyn visit her test came back normal around August or September.    HPI GENAE STRINE presents for follow up. She  has a past medical history of Anxiety, CAD (coronary artery disease), Hyperlipidemia, and Hypertension.   She is following up for anxiety currently prescribed hydroxyzine 10 mg TID prn. She feels like it has been effective. She has taken it but has spaced it out do to quantity.   She has bump on her right arm that has been in place for several years. She has noticed an uncomfortable itch. She endorses the size fluctuates occasionally. She denies a previous injury or strenuous activity.  N/V/W in her arm.  She has been evaluated by the dermatologist. She thought that it was an lipoma. She did not want to get it removed at that time. However it has be become more of a concern. She would like this evaluated further.   She is also having left foot tingling. This is all the time greater than one year. She describes it as "needles". She reports that she wore a boot that belonged to her husband. She felt like it was helpful during the night. That specific pain at that time resolved.  She wiggles her feet this helps some. She feels like it is worse at night. She reports that this is on the side of her foot. She denies any numbness or weakness. She  denies wearing any heels or uncomfortable shoes. She denies crossing her legs. She denies any past back problems or foot injuries. She has used some Bengay cream and tiger balm. She has not noticed a difference.   She has vaginal irritation that comes and goes. She has used cream and Vaseline it wears off. She denies any itch. She had not noticed any discharge. She did some scant blood but this was related to rubbing area. She denies any abnormal bleeding. She is SP pap test which was negative. Denies history of candida or BV.   Past Medical History:  Diagnosis Date   Anxiety    pt denies this dx   CAD (coronary artery disease)    1/19 PCI/DESx1 to LAD, and DESx2 to OM, normal EF via LV gram   Hyperlipidemia    Hypertension     Past Surgical History:  Procedure Laterality Date   ABDOMINAL HYSTERECTOMY     patient denies this surgery - patient had an LAVH by Dr Radene Knee   COLONOSCOPY  2006   [facility]   CORONARY ANGIOPLASTY WITH STENT PLACEMENT  12/24/2017   CORONARY BALLOON ANGIOPLASTY N/A 12/24/2017   Procedure: CORONARY BALLOON ANGIOPLASTY;  Surgeon: Leonie Man, MD;  Location: [facility];  Service: Cardiovascular;  Laterality: N/A;   CORONARY STENT INTERVENTION N/A 12/24/2017  Procedure: CORONARY STENT INTERVENTION;  Surgeon: Leonie Man, MD;  Location: [facility];  Service: Cardiovascular;  Laterality: N/A;   DIAGNOSTIC LAPAROSCOPY     ENDOMETRIAL ABLATION W/ NOVASURE     LAPAROSCOPIC ASSISTED VAGINAL HYSTERECTOMY N/A 07/07/2013   Procedure: LAPAROSCOPIC ASSISTED VAGINAL HYSTERECTOMY;  Surgeon: Darlyn Chamber, MD;  Location: [facility];  Service: Gynecology;  Laterality: N/A;   RIGHT/LEFT HEART CATH AND CORONARY ANGIOGRAPHY N/A 12/24/2017   Procedure: RIGHT/LEFT HEART CATH AND CORONARY ANGIOGRAPHY;  Surgeon: Leonie Man, MD;  Location: [facility];  Service: Cardiovascular;  Laterality: N/A;   TONSILLECTOMY     TUBAL LIGATION     bilateral    Family  History  Problem Relation Age of Onset   Uterine cancer Mother    Coronary artery disease Father    Hypertension Father    Stroke Father    Heart attack Father    Heart disease Paternal Uncle    Heart attack Paternal Uncle    Diabetes Paternal Grandmother    Kidney disease Paternal Grandmother    Colon cancer Neg Hx    Rectal cancer Neg Hx    Stomach cancer Neg Hx     Social History   Socioeconomic History   Marital status: Married    Spouse name: Not on file   Number of children: 2   Years of education: Not on file   Highest education level: Not on file  Occupational History   Occupation: Education officer, museum  Tobacco Use   Smoking status: Never   Smokeless tobacco: Never  Vaping Use   Vaping Use: Never used  Substance and Sexual Activity   Alcohol use: Yes    Alcohol/week: 7.0 - 14.0 standard drinks    Types: 7 - 14 Glasses of wine per week    Comment: socially drinks wine.   Drug use: No   Sexual activity: Yes    Birth control/protection: Other-see comments    Comment: Hysterectomy - LAVH  Other Topics Concern   Not on file  Social History Narrative   Not on file   Social Determinants of Health   Financial Resource Strain: Not on file  Food Insecurity: Not on file  Transportation Needs: Not on file  Physical Activity: Not on file  Stress: Not on file  Social Connections: Not on file  Intimate Partner Violence: Not on file    Outpatient Medications Prior to Visit  Medication Sig Dispense Refill   aspirin EC 81 MG tablet Take 1 tablet (81 mg total) by mouth daily. 90 tablet 3   atorvastatin (LIPITOR) 80 MG tablet TAKE 1 TABLET BY MOUTH EVERY DAY 90 tablet 3   carvedilol (COREG) 6.25 MG tablet TAKE 1 TABLET BY MOUTH TWICE A DAY 60 tablet 11   nitroGLYCERIN (NITROSTAT) 0.4 MG SL tablet Place 1 tablet (0.4 mg total) under the tongue every 5 (five) minutes as needed. 25 tablet 2   hydrOXYzine (ATARAX/VISTARIL) 10 MG tablet Take 1 tablet (10 mg total) by mouth 3  (three) times daily as needed. 30 tablet 0   No facility-administered medications prior to visit.    No Known Allergies  ROS Review of Systems    Objective:    Physical Exam Constitutional:      Appearance: She is normal weight.  HENT:     Head: Normocephalic and atraumatic.     Nose: Nose normal.     Mouth/Throat:     Mouth: Mucous membranes are moist.  Cardiovascular:     Rate and Rhythm: Normal rate and regular rhythm.     Pulses: Normal pulses.          Dorsalis pedis pulses are 2+ on the right side and 2+ on the left side.       Posterior tibial pulses are 2+ on the right side and 2+ on the left side.     Heart sounds: Normal heart sounds.  Pulmonary:     Effort: Pulmonary effort is normal.     Breath sounds: Normal breath sounds.  Abdominal:     General: Bowel sounds are normal.     Palpations: Abdomen is soft.  Musculoskeletal:        General: Normal range of motion.     Cervical back: Normal range of motion.     Right foot: Normal range of motion. No deformity.     Left foot: Normal range of motion. No deformity.  Feet:     Right foot:     Skin integrity: Callus (lateral ball nickel size) present. No erythema or dry skin.     Toenail Condition: Right toenails are normal.     Left foot:     Skin integrity: Callus (dime size to lateral foot) present. No erythema or dry skin.     Toenail Condition: Left toenails are normal.  Skin:    General: Skin is warm and dry.     Capillary Refill: Capillary refill takes less than 2 seconds.     Comments: < Quarter size cyst to right forearm palpable and moveable non tender  Neurological:     General: No focal deficit present.     Mental Status: She is alert and oriented to person, place, and time.  Psychiatric:        Mood and Affect: Mood normal.        Behavior: Behavior normal.        Thought Content: Thought content normal.        Judgment: Judgment normal.    BP 132/82    Pulse 68    Temp 97.9 F (36.6 C)     Ht '5\' 3"'  (1.6 m)    Wt 151 lb 6.4 oz (68.7 kg)    LMP 04/17/2013    SpO2 98%    BMI 26.82 kg/m  Wt Readings from Last 3 Encounters:  11/24/21 151 lb 6.4 oz (68.7 kg)  08/24/21 149 lb 0.2 oz (67.6 kg)  01/05/21 145 lb (65.8 kg)     Health Maintenance Due  Topic Date Due   Pneumococcal Vaccine 37-73 Years old (1 - PCV) Never done   HIV Screening  Never done   Hepatitis C Screening  Never done   Zoster Vaccines- Shingrix (1 of 2) Never done   MAMMOGRAM  06/14/2018   COVID-19 Vaccine (4 - Booster) 02/09/2021   INFLUENZA VACCINE  07/03/2021    There are no preventive care reminders to display for this patient.  Lab Results  Component Value Date   TSH 2.850 08/24/2021   Lab Results  Component Value Date   WBC 6.8 05/09/2020   HGB 12.3 05/09/2020   HCT 37.9 05/09/2020   MCV 88 05/09/2020   PLT 234 12/25/2017   Lab Results  Component Value Date   NA 141 08/24/2021   K 4.3 08/24/2021   CO2 24 05/09/2020   GLUCOSE 89 08/24/2021   BUN 11 08/24/2021   CREATININE 0.83 08/24/2021   BILITOT 1.2 08/24/2021   ALKPHOS 86 08/24/2021  AST 22 08/24/2021   ALT 21 05/09/2020   PROT 7.5 08/24/2021   ALBUMIN 4.5 08/24/2021   CALCIUM 10.1 08/24/2021   ANIONGAP 13 12/25/2017   EGFR 83 08/24/2021   GFR 88.51 02/13/2013   Lab Results  Component Value Date   CHOL 160 08/24/2021   Lab Results  Component Value Date   HDL 64 08/24/2021   Lab Results  Component Value Date   LDLCALC 79 08/24/2021   Lab Results  Component Value Date   TRIG 95 08/24/2021   Lab Results  Component Value Date   CHOLHDL 2.5 08/24/2021   Lab Results  Component Value Date   HGBA1C 5.1 08/24/2021      Assessment & Plan:   Problem List Items Addressed This Visit   None Visit Diagnoses     Anxiety    -  Primary Stable  Increase in hydroxyzine 25 mg TID     Relevant Medications   hydrOXYzine (VISTARIL) 25 MG capsule   Left foot pain     Persistent  Images to evaluate for abdonormal  growth    Relevant Orders   DG Foot Complete Left   Vaginal irritation     Persistent    Relevant Orders   NuSwab Vaginitis (VG)   Lipoma of right forearm     Persistent  Excision to be considered       Meds ordered this encounter  Medications   hydrOXYzine (VISTARIL) 25 MG capsule    Sig: Take 1 capsule (25 mg total) by mouth every 8 (eight) hours as needed.    Dispense:  90 capsule    Refill:  2    Order Specific Question:   Supervising Provider    Answer:   Tresa Garter [[id number]]   fluconazole (DIFLUCAN) 150 MG tablet    Sig: Take 1 tablet (150 mg total) by mouth daily for 1 day.    Dispense:  1 tablet    Refill:  0    Order Specific Question:   Supervising Provider    Answer:   Tresa Garter [[id number]]    Follow-up: Return in about 3 months (around 02/22/2022).    Vevelyn Francois, NP

## 2021-11-29 LAB — NUSWAB VAGINITIS (VG)
Candida albicans, NAA: NEGATIVE
Candida glabrata, NAA: NEGATIVE
Trich vag by NAA: NEGATIVE

## 2022-01-12 ENCOUNTER — Encounter: Payer: Self-pay | Admitting: Cardiovascular Disease

## 2022-01-12 ENCOUNTER — Other Ambulatory Visit: Payer: Self-pay

## 2022-01-12 ENCOUNTER — Ambulatory Visit: Payer: 59 | Admitting: Cardiovascular Disease

## 2022-01-12 VITALS — BP 128/88 | HR 62 | Ht 63.0 in | Wt 156.6 lb

## 2022-01-12 DIAGNOSIS — E7800 Pure hypercholesterolemia, unspecified: Secondary | ICD-10-CM | POA: Diagnosis not present

## 2022-01-12 DIAGNOSIS — R0602 Shortness of breath: Secondary | ICD-10-CM | POA: Diagnosis not present

## 2022-01-12 DIAGNOSIS — I10 Essential (primary) hypertension: Secondary | ICD-10-CM

## 2022-01-12 DIAGNOSIS — I25118 Atherosclerotic heart disease of native coronary artery with other forms of angina pectoris: Secondary | ICD-10-CM | POA: Diagnosis not present

## 2022-01-12 DIAGNOSIS — E049 Nontoxic goiter, unspecified: Secondary | ICD-10-CM | POA: Diagnosis not present

## 2022-01-12 MED ORDER — EZETIMIBE 10 MG PO TABS: 10.0000 mg | ORAL_TABLET | Freq: Every day | ORAL | 3 refills | Status: DC

## 2022-01-12 MED ORDER — NITROGLYCERIN 0.4 MG SL SUBL: 0.4000 mg | SUBLINGUAL_TABLET | SUBLINGUAL | 2 refills | Status: DC | PRN

## 2022-01-12 NOTE — Progress Notes (Signed)
Cardiology Office Note    Date:  01/12/2022   ID:  [name], Nancy 02-02-1966, MRN [id number]  PCP:  Philis Fendt, [facility]  Cardiologist:   Sanda Klein, MD   Chief Complaint  Patient presents with   Shortness of Breath   Coronary Artery Disease         History of Present Illness:  Nancy [name] is a 56 y.o. female with a strong family history of premature coronary artery disease but without any other coronary risk factors, presenting with complaints of exertional dyspnea, found to have coronary artery disease and received drug-eluting stents to the second oblique marginal in the LAD artery in January 2019.  In addition had a high-grade stenosis in the ramus intermedius, but this vessel was too small for PCI.  35% stenosis was seen in the RCA.  Right heart catheterization showed essentially normal pressures.  Overall she has done well since her last appointment but she has recently noticed more problems with shortness of breath.  This is not as severe as it was at the initial presentation.  She can climb a flight of stairs without stopping to rest, but feels winded at the top.  She has never had angina pectoris.  She has been walking on the average about 3 miles a day, but overall is exercising less than she did a year ago.  She has gained about 6 pounds.  She denies palpitations, dizziness lightheadedness or syncope.  She has not had any focal neurological complaints lower extremity edema or intermittent claudication.  Diastolic blood pressure is a little high at 88.  LDL cholesterol is just shy of target at 79.  Echo 2017 showed left ventricular wall thickness and systolic function was normal, but there was evidence of diastolic dysfunction  Her father had heart disease starting in his 75s and has undergone both stents and bypass surgery. Her mother started having coronary problems in her 42s and had an aborted infarction with percutaneous revascularization.   Has a dry cough with ACE  inhibitors.    Conclusion CATH 12/24/2017    Prox LAD lesion is 95% stenosed. A drug-eluting stent was successfully placed using a STENT SYNERGY DES 2.5X28. Post intervention, there is a 0% residual stenosis. _____________ Colon Flattery 1st Mrg lesion is 80% stenosed. Ost 1st Mrg to 1st Mrg lesion is 85% stenosed. 2 Overlapping drug-eluting stents were successfully placed using a STENT SYNERGY DES 2.25X16 with a STENT SYNERGY DES 2.25X12 proximally Post intervention, there is a 0% residual stenosis. 1st Mrg lesion is 90% stenosed. Distal wire dissection Balloon angioplasty was performed using a BALLOON SAPPHIRE 2.0X15. Post intervention, there is a 0% residual stenosis. _____________ [name] lesion is 95% stenosed. Too small for PCI Ost 1st Diag lesion is 50% stenosed. The left ventricular systolic function is normal. The left ventricular ejection fraction is 55-65% by visual estimate. Normal RHC Pressures.   Severe 2 vessel PCI - pLAD 99% & ost-p OM1. -- PCI LAD 1 stent, OM1 2 overlapping DES with PTCA distal to stent 2/2 wire dissection. Essentially normal right heart cath pressures with LVEDP and wedge pressure of 11-15 mmHg.   Plan: She will be monitored overnight for post PCI complications anticipate discharge tomorrow if stable.  DAPT with aspirin and Plavix for minimum 1 year. Restart home medications, but have increase Crestor to 40 mg daily. Did not restart Lasix, could potentially give tomorrow versus the day after discharge.      Past Medical History:  Diagnosis  Date   Anxiety    pt denies this dx   CAD (coronary artery disease)    1/19 PCI/DESx1 to LAD, and DESx2 to OM, normal EF via LV gram   Hyperlipidemia    Hypertension     Past Surgical History:  Procedure Laterality Date   ABDOMINAL HYSTERECTOMY     patient denies this surgery - patient had an LAVH by Dr Radene Knee   COLONOSCOPY  2006   [facility]   CORONARY ANGIOPLASTY WITH STENT PLACEMENT  12/24/2017   CORONARY  BALLOON ANGIOPLASTY N/A 12/24/2017   Procedure: CORONARY BALLOON ANGIOPLASTY;  Surgeon: Leonie Man, MD;  Location: [facility];  Service: Cardiovascular;  Laterality: N/A;   CORONARY STENT INTERVENTION N/A 12/24/2017   Procedure: CORONARY STENT INTERVENTION;  Surgeon: Leonie Man, MD;  Location: [facility] [facility];  Service: Cardiovascular;  Laterality: N/A;   DIAGNOSTIC LAPAROSCOPY     ENDOMETRIAL ABLATION W/ NOVASURE     LAPAROSCOPIC ASSISTED VAGINAL HYSTERECTOMY N/A 07/07/2013   Procedure: LAPAROSCOPIC ASSISTED VAGINAL HYSTERECTOMY;  Surgeon: Darlyn Chamber, MD;  Location: [facility];  Service: Gynecology;  Laterality: N/A;   RIGHT/LEFT HEART CATH AND CORONARY ANGIOGRAPHY N/A 12/24/2017   Procedure: RIGHT/LEFT HEART CATH AND CORONARY ANGIOGRAPHY;  Surgeon: Leonie Man, MD;  Location: [facility];  Service: Cardiovascular;  Laterality: N/A;   TONSILLECTOMY     TUBAL LIGATION     bilateral    Current Medications: Outpatient Medications Prior to Visit  Medication Sig Dispense Refill   aspirin EC 81 MG tablet Take 1 tablet (81 mg total) by mouth daily. 90 tablet 3   atorvastatin (LIPITOR) 80 MG tablet TAKE 1 TABLET BY MOUTH EVERY DAY 90 tablet 3   carvedilol (COREG) 6.25 MG tablet TAKE 1 TABLET BY MOUTH TWICE A DAY 60 tablet 11   hydrOXYzine (VISTARIL) 25 MG capsule Take 1 capsule (25 mg total) by mouth every 8 (eight) hours as needed. 90 capsule 2   nitroGLYCERIN (NITROSTAT) 0.4 MG SL tablet Place 1 tablet (0.4 mg total) under the tongue every 5 (five) minutes as needed. 25 tablet 2   No facility-administered medications prior to visit.     Allergies:   Patient has no known allergies.   Social History   Socioeconomic History   Marital status: Married    Spouse name: Not on file   Number of children: 2   Years of education: Not on file   Highest education level: Not on file  Occupational History   Occupation: Education officer, [employer]  Tobacco Use   Smoking status:  Never   Smokeless tobacco: Never  Vaping Use   Vaping Use: Never used  Substance and Sexual Activity   Alcohol use: Yes    Alcohol/week: 7.0 - 14.0 standard drinks    Types: 7 - 14 Glasses of wine per week    Comment: socially drinks wine.   Drug use: No   Sexual activity: Yes    Birth control/protection: Other-see comments    Comment: Hysterectomy - LAVH  Other Topics Concern   Not on file  Social History Narrative   Not on file   Social Determinants of Health   Financial Resource Strain: Not on file  Food Insecurity: Not on file  Transportation Needs: Not on file  Physical Activity: Not on file  Stress: Not on file  Social Connections: Not on file     Family History:  The patient's family history includes Coronary artery disease in her  father; Diabetes in her paternal grandmother; Heart attack in her father and paternal uncle; Heart disease in her paternal uncle; Hypertension in her father; Kidney disease in her paternal grandmother; Stroke in her father; Uterine cancer in her mother.   ROS:   Please see the history of present illness.    All other systems are reviewed and are negative.   PHYSICAL EXAM:   VS:  BP 128/88    Pulse 62    Ht 5\' 3"  (1.6 m)    Wt 156 lb 9.6 oz (71 kg)    LMP 04/17/2013    SpO2 92%    BMI 27.74 kg/m      General: Alert, oriented x3, no distress, mildly overweight.  Appears fit Head: no evidence of trauma, PERRL, EOMI, no exophtalmos or lid lag, no myxedema, no xanthelasma; normal ears, nose and oropharynx Neck: normal jugular venous pulsations and no hepatojugular reflux; brisk carotid pulses without delay and no carotid bruits Chest: clear to auscultation, no signs of consolidation by percussion or palpation, normal fremitus, symmetrical and full respiratory excursions Cardiovascular: normal position and quality of the apical impulse, regular rhythm, normal first and second heart sounds, no murmurs, rubs or gallops Abdomen: no tenderness or  distention, no masses by palpation, no abnormal pulsatility or arterial bruits, normal bowel sounds, no hepatosplenomegaly Extremities: no clubbing, cyanosis or edema; 2+ radial, ulnar and brachial pulses bilaterally; 2+ right femoral, posterior tibial and dorsalis pedis pulses; 2+ left femoral, posterior tibial and dorsalis pedis pulses; no subclavian or femoral bruits Neurological: grossly nonfocal Psych: Normal mood and affect    Wt Readings from Last 3 Encounters:  01/12/22 156 lb 9.6 oz (71 kg)  11/24/21 151 lb 6.4 oz (68.7 kg)  08/24/21 149 lb 0.2 oz (67.6 kg)    Studies/Labs Reviewed:   EKG:  EKG is ordered today.  Personally reviewed, it is a completely normal tracing, shows normal sinus rhythm  Recent Labs: 08/24/2021: BUN 11; Creatinine, Ser 0.83; Potassium 4.3; Sodium 141; TSH 2.850   Lipid Panel    Component Value Date/Time   CHOL 160 08/24/2021 1028   CHOL 147 02/13/2013 0902   TRIG 95 08/24/2021 1028   TRIG 72 02/13/2013 0902   HDL 64 08/24/2021 1028   HDL 43 02/13/2013 0902   CHOLHDL 2.5 08/24/2021 1028   CHOLHDL 3.1 08/17/2016 0937   VLDL 12 08/17/2016 0937   LDLCALC 79 08/24/2021 1028   LDLCALC 90 02/13/2013 0902    ASSESSMENT:    1. Coronary artery disease involving native coronary artery of native heart with other form of angina pectoris ([place])   2. Hypercholesterolemia   3. Benign essential HTN   4. Goiter   5. Shortness of breath       PLAN:   1. CAD: She has developed exertional dyspnea, which was her initial angina equivalent that led to her diagnosis of CAD.  This not as severe as the past, but does raise some concerns.  We will schedule her for a plain treadmill stress test.  This will allow [place] to objectively quantify her exercise capacity.  Warned her about the high likelihood of "false positive" ECG responses in patients with previous revascularization.  If she does have an abnormal ECG response I would then follow it up with a nuclear  perfusion study, prior to considering repeat coronary angiography.  If the treadmill stress test is normal however, I think we can continue with medical management. 2. HLP: LDL has increased, may be due  to some weight gain.  We will add Zetia 10 mg daily since she is already on the maximum dose of atorvastatin. 3. HTN: Diastolic blood pressure remains a little high.  Systolic blood pressure is well within target range.  Continue carvedilol, encouraged more physical exercise, weight loss, avoid salt. 4. Goiter: TSH was 2.85 last September.  Clinically euthyroid.  Shared Decision Making/Informed Consent The risks [chest pain, shortness of breath, cardiac arrhythmias, dizziness, blood pressure fluctuations, myocardial infarction, stroke/transient ischemic attack, and life-threatening complications (estimated to be 1 in 10,000)], benefits (risk stratification, diagnosing coronary artery disease, treatment guidance) and alternatives of an exercise tolerance test were discussed in detail with Nancy [name] and she agrees to proceed.    Medication Adjustments/Labs and Tests Ordered: Current medicines are reviewed at length with the patient today.  Concerns regarding medicines are outlined above.  Medication changes, Labs and Tests ordered today are listed in the Patient Instructions below. Patient Instructions  Medication Instructions:  START Ezetimibe (Zetia) 10 mg once daily  *If you need a refill on your cardiac medications before your next appointment, please call your pharmacy*   Lab Work: Your provider would like for you to return in 3 months to have the following labs drawn: fasting Lipid. You do not need an appointment for the lab. Once in our office lobby there is a podium where you can sign in and ring the doorbell to alert [place] that you are here. The lab is open from 8:00 am to 4:30 pm; closed for lunch from 12:45pm-1:45pm.  If you have labs (blood work) drawn today and your tests are completely  normal, you will receive your results only by: [place] (if you have MyChart) OR A paper copy in the mail If you have any lab test that is abnormal or we need to change your treatment, we will call you to review the results.   Testing/Procedures: Your physician has requested that you have an exercise tolerance test. For further information please visit [email or web address]. Please also follow instruction sheet, as given. This will take place at [place], [address]. Do not drink or eat foods with caffeine for 24 hours before the test. (Chocolate, coffee, tea, or energy drinks) If you use an inhaler, bring it with you to the test. Do not smoke for 4 hours before the test. Wear comfortable shoes and clothing.   Follow-Up: At [facility], you and your health needs are our priority.  As part of our continuing mission to provide you with exceptional heart care, we have created designated Provider Care Teams.  These Care Teams include your primary Cardiologist (physician) and Advanced Practice Providers (APPs -  Physician Assistants and Nurse Practitioners) who all work together to provide you with the care you need, when you need it.  We recommend signing up for the patient portal called "MyChart".  Sign up information is provided on this After Visit Summary.  MyChart is used to connect with patients for Virtual Visits (Telemedicine).  Patients are able to view lab/test results, encounter notes, upcoming appointments, etc.  Non-urgent messages can be sent to your provider as well.   To learn more about what you can do with MyChart, go to [email or web address].    Your next appointment:   12 month(s)  The format for your next appointment:   In Person  Provider:   Sanda Klein, MD {     Signed, Sanda Klein, MD  01/12/2022 10:06 AM    [address]  [address], [place], [place]  [address] Phone: [phone]; Fax: [phone]

## 2022-01-12 NOTE — Patient Instructions (Signed)
Medication Instructions:  START Ezetimibe (Zetia) 10 mg once daily  *If you need a refill on your cardiac medications before your next appointment, please call your pharmacy*   Lab Work: Your provider would like for you to return in 3 months to have the following labs drawn: fasting Lipid. You do not need an appointment for the lab. Once in our office lobby there is a podium where you can sign in and ring the doorbell to alert [place] that you are here. The lab is open from 8:00 am to 4:30 pm; closed for lunch from 12:45pm-1:45pm.  If you have labs (blood work) drawn today and your tests are completely normal, you will receive your results only by: [place] (if you have MyChart) OR A paper copy in the mail If you have any lab test that is abnormal or we need to change your treatment, we will call you to review the results.   Testing/Procedures: Your physician has requested that you have an exercise tolerance test. For further information please visit [email or web address]. Please also follow instruction sheet, as given. This will take place at [place], [address]. Do not drink or eat foods with caffeine for 24 hours before the test. (Chocolate, coffee, tea, or energy drinks) If you use an inhaler, bring it with you to the test. Do not smoke for 4 hours before the test. Wear comfortable shoes and clothing.   Follow-Up: At [facility], you and your health needs are our priority.  As part of our continuing mission to provide you with exceptional heart care, we have created designated Provider Care Teams.  These Care Teams include your primary Cardiologist (physician) and Advanced Practice Providers (APPs -  Physician Assistants and Nurse Practitioners) who all work together to provide you with the care you need, when you need it.  We recommend signing up for the patient portal called "MyChart".  Sign up information is provided on this After Visit Summary.  MyChart is used to  connect with patients for Virtual Visits (Telemedicine).  Patients are able to view lab/test results, encounter notes, upcoming appointments, etc.  Non-urgent messages can be sent to your provider as well.   To learn more about what you can do with MyChart, go to [email or web address].    Your next appointment:   12 month(s)  The format for your next appointment:   In Person  Provider:   Sanda Klein, MD {

## 2022-01-17 ENCOUNTER — Telehealth (HOSPITAL_COMMUNITY): Payer: Self-pay | Admitting: *Deleted

## 2022-01-17 NOTE — Telephone Encounter (Signed)
Close encounter 

## 2022-01-18 ENCOUNTER — Telehealth: Payer: Self-pay | Admitting: *Deleted

## 2022-01-18 ENCOUNTER — Ambulatory Visit (HOSPITAL_COMMUNITY)
Admission: RE | Admit: 2022-01-18 | Discharge: 2022-01-18 | Disposition: A | Discharge status: Home / Self Care | Payer: 59 | Source: Ambulatory Visit | Attending: Cardiology | Admitting: Cardiology

## 2022-01-18 ENCOUNTER — Other Ambulatory Visit: Payer: Self-pay

## 2022-01-18 DIAGNOSIS — R0602 Shortness of breath: Secondary | ICD-10-CM | POA: Diagnosis not present

## 2022-01-18 DIAGNOSIS — I25118 Atherosclerotic heart disease of native coronary artery with other forms of angina pectoris: Secondary | ICD-10-CM

## 2022-01-18 LAB — EXERCISE TOLERANCE TEST
Angina Index: 0
Duke Treadmill Score: 10
Estimated workload: 11.2
Exercise duration (min): 9 min
Exercise duration (sec): 43 s
MPHR: 164 {beats}/min
Peak HR: 144 {beats}/min
Percent HR: 87 %
Rest HR: 59 {beats}/min
ST Elevation (mm): -1 mm

## 2022-01-18 MED ORDER — AMLODIPINE BESYLATE 2.5 MG PO TABS: 2.5000 mg | ORAL_TABLET | Freq: Every day | ORAL | 3 refills | Status: DC

## 2022-01-18 NOTE — Telephone Encounter (Signed)
Spoke with pt, aware of results. Order placed for nuclear stress testing. New script sent to the pharmacy

## 2022-01-18 NOTE — Telephone Encounter (Signed)
-----   Message from Sanda Klein, MD sent at 01/18/2022  2:17 PM EST ----- The treadmill stress test is abnormal; as we discussed in clinic that is quite commonly a "false positive" response in patients who have had previous stents.  Before we go all the way to heart catheterization, I would recommend a nuclear perfusion study.  Please schedule for a treadmill Myoview. Note also that her diastolic blood pressure was still little bit high.  Would like to add amlodipine 2.5 mg once daily to her carvedilol.

## 2022-01-31 ENCOUNTER — Other Ambulatory Visit: Payer: Self-pay | Admitting: Cardiovascular Disease

## 2022-01-31 ENCOUNTER — Telehealth (HOSPITAL_COMMUNITY): Payer: Self-pay

## 2022-01-31 NOTE — Telephone Encounter (Signed)
Spoke with the patient, detailed instructions given. She stated she understood and would be here for her test. Asked to call back with any questions. S.Yonatan Guitron EMTP ?

## 2022-02-01 ENCOUNTER — Other Ambulatory Visit: Payer: Self-pay

## 2022-02-01 ENCOUNTER — Ambulatory Visit (HOSPITAL_COMMUNITY): Payer: 59 | Attending: Cardiology

## 2022-02-01 DIAGNOSIS — I25118 Atherosclerotic heart disease of native coronary artery with other forms of angina pectoris: Secondary | ICD-10-CM | POA: Diagnosis present

## 2022-02-01 LAB — MYOCARDIAL PERFUSION IMAGING
Estimated workload: 7
Exercise duration (min): 5 min
Exercise duration (sec): 59 s
LV dias vol: 52 mL (ref 46–106)
LV sys vol: 16 mL
MPHR: 164 {beats}/min
Nuc Stress EF: 69 %
Peak HR: 146 {beats}/min
Percent HR: 89 %
Rest HR: 79 {beats}/min
Rest Nuclear Isotope Dose: 9.8 mCi
SDS: 4
SRS: 1
SSS: 5
ST Depression (mm): 0 mm
Stress Nuclear Isotope Dose: 30.2 mCi
TID: 0.82

## 2022-02-01 MED ORDER — TECHNETIUM TC 99M TETROFOSMIN IV KIT
9.8000 | PACK | Freq: Once | INTRAVENOUS | Status: AC | PRN
  Administered 2022-02-01: 9.8 via INTRAVENOUS
  Filled 2022-02-01: qty 10

## 2022-02-01 MED ORDER — TECHNETIUM TC 99M TETROFOSMIN IV KIT
30.2000 | PACK | Freq: Once | INTRAVENOUS | Status: AC | PRN
  Administered 2022-02-01: 30.2 via INTRAVENOUS
  Filled 2022-02-01: qty 31

## 2022-02-23 ENCOUNTER — Ambulatory Visit: Payer: 59 | Admitting: Nurse Practitioner

## 2022-03-06 ENCOUNTER — Other Ambulatory Visit: Payer: Self-pay | Admitting: Nurse Practitioner

## 2022-03-13 ENCOUNTER — Ambulatory Visit: Payer: 59 | Admitting: Nurse Practitioner

## 2022-03-21 ENCOUNTER — Ambulatory Visit: Payer: 59 | Admitting: Nurse Practitioner

## 2022-03-21 ENCOUNTER — Encounter: Payer: Self-pay | Admitting: Nurse Practitioner

## 2022-03-21 VITALS — BP 127/77 | HR 73 | Temp 97.9°F | Ht 63.0 in | Wt 151.2 lb

## 2022-03-21 DIAGNOSIS — I5032 Chronic diastolic (congestive) heart failure: Secondary | ICD-10-CM

## 2022-03-21 DIAGNOSIS — R0609 Other forms of dyspnea: Secondary | ICD-10-CM

## 2022-03-21 MED ORDER — ALBUTEROL SULFATE HFA 108 (90 BASE) MCG/ACT IN AERS: 2.0000 | INHALATION_SPRAY | Freq: Four times a day (QID) | RESPIRATORY_TRACT | 2 refills | Status: DC | PRN

## 2022-03-21 NOTE — Progress Notes (Signed)
? ?[place] ?[place], [place]  [address] ?Phone:  [phone]   Fax:  [phone] ?Subjective:  ? Patient ID: Nancy [name], female    DOB: 1966/01/23, 56 y.o.   MRN: [id number] ? ?Chief Complaint  ?Patient presents with  ? Follow-up  ?  Patient is here today for her 3 month follow up visit and to discuss her shortness of breath that she has been having off and on. Patient states that she is seeing a Cardiologist and she thinks that her shortness of breath may be due to her weight but not sure. Patient states that her shortness of breath is when she is moving in fast pace.  ? ?HPI ?Nancy [name] 56 y.o. female  has a past medical history of Anxiety, CAD (coronary artery disease), Hyperlipidemia, and Hypertension. To the [facility] for follow up and evaluation of for dyspnea with exertion. ? ?States that she began having dyspnea over the past two months. Was being evaluated by cardiology and completed stress test with benign results. Verbalizes, "I feel like I am more out of breath than I should be." Suspects that its related to recent weight gain, but symptoms only occur with activity. Patient denies any changes in exercise regimen and/ activity level over the past 2-3 yrs. Denies any chest pain.  ? 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 ? ?Generally eats a balanced diet. Denies any other concerns. Denies any fatigue, chest pain, HA or dizziness. Denies any blurred vision, numbness or tingling. ? ?Past Medical History:  ?Diagnosis Date  ? Anxiety   ? pt denies this dx  ? CAD (coronary artery disease)   ? 1/19 PCI/DESx1 to LAD, and DESx2 to OM, normal EF via LV gram  ? Hyperlipidemia   ? Hypertension   ? ? ?Past Surgical History:  ?Procedure Laterality Date  ? ABDOMINAL HYSTERECTOMY    ? patient denies this surgery - patient had an LAVH by Dr Radene Knee  ? COLONOSCOPY  2006  ? [facility]  ? CORONARY ANGIOPLASTY WITH  STENT PLACEMENT  12/24/2017  ? CORONARY BALLOON ANGIOPLASTY N/A 12/24/2017  ? Procedure: CORONARY BALLOON ANGIOPLASTY;  Surgeon: Leonie Man, MD;  Location: [facility];  Service: Cardiovascular;  Laterality: N/A;  ? CORONARY STENT INTERVENTION N/A 12/24/2017  ? Procedure: CORONARY STENT INTERVENTION;  Surgeon: Leonie Man, MD;  Location: [facility];  Service: Cardiovascular;  Laterality: N/A;  ? DIAGNOSTIC LAPAROSCOPY    ? ENDOMETRIAL ABLATION W/ NOVASURE    ? LAPAROSCOPIC ASSISTED VAGINAL HYSTERECTOMY N/A 07/07/2013  ? Procedure: LAPAROSCOPIC ASSISTED VAGINAL HYSTERECTOMY;  Surgeon: Darlyn Chamber, MD;  Location: [facility];  Service: Gynecology;  Laterality: N/A;  ? RIGHT/LEFT HEART CATH AND CORONARY ANGIOGRAPHY N/A 12/24/2017  ? Procedure: RIGHT/LEFT HEART CATH AND CORONARY ANGIOGRAPHY;  Surgeon: Leonie Man, MD;  Location: [facility];  Service: Cardiovascular;  Laterality: N/A;  ? TONSILLECTOMY    ? TUBAL LIGATION    ? bilateral  ? ? ?Family History  ?Problem Relation Age of Onset  ? Uterine cancer Mother   ? Coronary artery disease Father   ? Hypertension Father   ? Stroke Father   ? Heart attack Father   ? Heart disease Paternal Uncle   ? Heart attack Paternal Uncle   ? Diabetes Paternal Grandmother   ? Kidney disease Paternal Grandmother   ? Colon cancer Neg Hx   ?  Rectal cancer Neg Hx   ? Stomach cancer Neg Hx   ? ? ?Social History  ? ?Socioeconomic History  ? Marital status: Married  ?  Spouse name: Not on file  ? Number of children: 2  ? Years of education: Not on file  ? Highest education level: Not on file  ?Occupational History  ? Occupation: Education officer, museum  ?Tobacco Use  ? Smoking status: Never  ? Smokeless tobacco: Never  ?Vaping Use  ? Vaping Use: Never used  ?Substance and Sexual Activity  ? Alcohol use: Yes  ?  Alcohol/week: 7.0 - 14.0 standard drinks  ?  Types: 7 - 14 Glasses of wine per week  ?  Comment: socially drinks wine.  ? Drug use: No  ? Sexual activity: Yes  ?   Birth control/protection: Other-see comments  ?  Comment: Hysterectomy - LAVH  ?Other Topics Concern  ? Not on file  ?Social History Narrative  ? Not on file  ? ?Social Determinants of Health  ? ?Financial Resource Strain: Not on file  ?Food Insecurity: Not on file  ?Transportation Needs: Not on file  ?Physical Activity: Not on file  ?Stress: Not on file  ?Social Connections: Not on file  ?Intimate Partner Violence: Not on file  ? ? ?Outpatient Medications Prior to Visit  ?Medication Sig Dispense Refill  ? amLODipine (NORVASC) 2.5 MG tablet Take 1 tablet (2.5 mg total) by mouth daily. 90 tablet 3  ? aspirin EC 81 MG tablet Take 1 tablet (81 mg total) by mouth daily. 90 tablet 3  ? atorvastatin (LIPITOR) 80 MG tablet TAKE 1 TABLET BY MOUTH EVERY DAY 90 tablet 3  ? carvedilol (COREG) 6.25 MG tablet TAKE 1 TABLET BY MOUTH TWICE A DAY 180 tablet 3  ? ezetimibe (ZETIA) 10 MG tablet Take 1 tablet (10 mg total) by mouth daily. 90 tablet 3  ? hydrOXYzine (VISTARIL) 25 MG capsule TAKE 1 CAPSULE BY MOUTH EVERY 8 HOURS AS NEEDED. 90 capsule 2  ? nitroGLYCERIN (NITROSTAT) 0.4 MG SL tablet Place 1 tablet (0.4 mg total) under the tongue every 5 (five) minutes as needed. 25 tablet 2  ? ?No facility-administered medications prior to visit.  ? ? ?No Known Allergies ? ?Review of Systems  ?Constitutional:  Positive for weight loss. Negative for chills, fever and malaise/fatigue.  ?HENT: Negative.    ?Eyes: Negative.   ?Respiratory:  Positive for shortness of breath. Negative for cough, hemoptysis, sputum production and wheezing.   ?Cardiovascular:  Negative for chest pain, palpitations and leg swelling.  ?Gastrointestinal:  Negative for abdominal pain, blood in stool, constipation, diarrhea, nausea and vomiting.  ?Skin: Negative.   ?Neurological: Negative.   ?Psychiatric/Behavioral:  Negative for depression. The patient is not nervous/anxious.   ?All other systems reviewed and are negative. ? ?   ?Objective:  ?  ?Physical Exam ?Vitals  reviewed.  ?Constitutional:   ?   General: She is not in acute distress. ?   Appearance: Normal appearance. She is normal weight.  ?HENT:  ?   Head: Normocephalic.  ?Cardiovascular:  ?   Rate and Rhythm: Normal rate and regular rhythm.  ?   Pulses: Normal pulses.  ?   Heart sounds: Normal heart sounds.  ?   Comments: No obvious peripheral edema ?Pulmonary:  ?   Effort: Pulmonary effort is normal.  ?   Breath sounds: Normal breath sounds.  ?Musculoskeletal:     ?   General: No swelling, tenderness, deformity or signs of injury.  Normal range of motion.  ?   Right lower leg: No edema.  ?   Left lower leg: No edema.  ?Skin: ?   General: Skin is warm and dry.  ?   Capillary Refill: Capillary refill takes less than 2 seconds.  ?Neurological:  ?   General: No focal deficit present.  ?   Mental Status: She is alert and oriented to person, place, and time.  ?Psychiatric:     ?   Mood and Affect: Mood normal.     ?   Behavior: Behavior normal.     ?   Thought Content: Thought content normal.     ?   Judgment: Judgment normal.  ? ? ?BP 127/77   Pulse 73   Temp 97.9 ?F (36.6 ?C)   Ht '5\' 3"'$  (1.6 m)   Wt 151 lb 3.2 oz (68.6 kg)   LMP 04/17/2013   SpO2 99%   BMI 26.78 kg/m?  ?Wt Readings from Last 3 Encounters:  ?03/21/22 151 lb 3.2 oz (68.6 kg)  ?01/12/22 156 lb 9.6 oz (71 kg)  ?11/24/21 151 lb 6.4 oz (68.7 kg)  ? ? ?Immunization History  ?Administered Date(s) Administered  ? Influenza,inj,Quad PF,6+ Mos 12/15/2020  ? PFIZER Comirnaty(Gray Top)Covid-19 Tri-Sucrose Vaccine 12/15/2020  ? Tdap 07/03/2012  ? Unspecified SARS-COV-2 Vaccination 02/06/2020, 02/27/2020  ? ? ?Diabetic Foot Exam - Simple   ?No data filed ?  ? ? ?Lab Results  ?Component Value Date  ? TSH 2.850 08/24/2021  ? ?Lab Results  ?Component Value Date  ? WBC 6.8 05/09/2020  ? HGB 12.3 05/09/2020  ? HCT 37.9 05/09/2020  ? MCV 88 05/09/2020  ? PLT 234 12/25/2017  ? ?Lab Results  ?Component Value Date  ? NA 141 08/24/2021  ? K 4.3 08/24/2021  ? CO2 24 05/09/2020   ? GLUCOSE 89 08/24/2021  ? BUN 11 08/24/2021  ? CREATININE 0.83 08/24/2021  ? BILITOT 1.2 08/24/2021  ? ALKPHOS 86 08/24/2021  ? AST 22 08/24/2021  ? ALT 21 05/09/2020  ? PROT 7.5 08/24/2021  ? ALBUMIN

## 2022-03-21 NOTE — Patient Instructions (Signed)
You were seen today in the [facility] for reevaluation of shortness of breath. You were prescribed medications, please take as directed. Please follow up in 2 mths for reevaluation.  ?

## 2022-04-04 ENCOUNTER — Other Ambulatory Visit: Payer: Self-pay | Admitting: Internal Medicine

## 2022-04-10 ENCOUNTER — Other Ambulatory Visit: Payer: Self-pay | Admitting: Nurse Practitioner

## 2022-04-10 DIAGNOSIS — R0609 Other forms of dyspnea: Secondary | ICD-10-CM

## 2022-04-18 LAB — LIPID PANEL
Chol/HDL Ratio: 2.3 ratio (ref 0.0–4.4)
Cholesterol, Total: 126 mg/dL (ref 100–199)
HDL: 56 mg/dL (ref >39)
LDL Chol Calc (NIH): 58 mg/dL (ref 0–99)
Triglycerides: 56 mg/dL (ref 0–149)
VLDL Cholesterol Cal: 12 mg/dL (ref 5–40)

## 2022-04-23 ENCOUNTER — Encounter: Payer: Self-pay | Admitting: *Deleted

## 2022-04-24 ENCOUNTER — Telehealth (HOSPITAL_COMMUNITY): Payer: Self-pay | Admitting: Nurse Practitioner

## 2022-04-24 NOTE — Telephone Encounter (Signed)
Just an FYI. We have made several attempts to contact this your office   to obtain Prior Authorization for the echocardiogram. We will be removing the patient from the echo Wq due to no response. See attempts below:  04/24/22 Still no response from PCP for PA#. Order will be removed from the active [place] and note sent to MD. [facility]  04/17/22 reached out on [place] for someone to obtain/[facility]W  04/17/22 Inbasket x 5 for PA#  04/10/22 inbasket sent for PA# x 4  04/05/22 secure chat sent for PA#  03/29/22 Inbasket sent for PA#  03/22/22 Inbasket sent for PA#   Thank you

## 2022-04-27 ENCOUNTER — Other Ambulatory Visit: Payer: Self-pay | Admitting: Nurse Practitioner

## 2022-04-27 DIAGNOSIS — I5032 Chronic diastolic (congestive) heart failure: Secondary | ICD-10-CM

## 2022-05-23 ENCOUNTER — Ambulatory Visit: Payer: 59 | Admitting: Nurse Practitioner

## 2022-08-07 NOTE — Progress Notes (Unsigned)
Cardiology Office Note    Date:  08/08/2022   ID:  Nancy [name], DOB 14-Apr-1966, MRN [id number]  PCP:  Philis Fendt, [facility]  Cardiologist:   Sanda Klein, MD   Chief Complaint  Patient presents with   Coronary Artery Disease    History of Present Illness:  Nancy [name] is a 56 y.o. female with a strong family history of premature coronary artery disease but without any other coronary risk factors, presenting with complaints of exertional dyspnea, found to have coronary artery disease and received drug-eluting stents to the second oblique marginal and  the LAD artery in January 2019.  In addition had a high-grade stenosis in the ramus intermedius, but this vessel was too small for PCI.  35% stenosis was seen in the RCA.  Right heart catheterization showed essentially normal pressures.  Had a "false positive"treadmill ECG test in February, followed by normal findings on a myocardial perfusion study in March 2023.  She feels that her stamina has improved and she is no longer short of breath.  She believes she was just deconditioned earlier in the year.  She walks 3 miles a day 4 days a week, covering the distance and roughly an hour each time.  Sometimes she will jump rope with the kids in school and does not have trouble with shortness of breath or chest pain.  The patient specifically denies any chest pain at rest, dyspnea at rest or with exertion, orthopnea, paroxysmal nocturnal dyspnea, syncope, palpitations, focal neurological deficits, intermittent claudication, lower extremity edema, unexplained weight gain, cough, hemoptysis or wheezing.  Her diastolic blood pressure today is a little high at 86, but just a few weeks ago her blood pressure was 127/77.  After addition of ezetimibe her lipid profile is great with an LDL cholesterol 58, HDL 56.  Echo 2017 showed left ventricular wall thickness and systolic function was normal, but there was evidence of diastolic dysfunction  Her father  had heart disease starting in his 31s and has undergone both stents and bypass surgery. Her mother started having coronary problems in her 101s and had an aborted infarction with percutaneous revascularization.   Has a dry cough with ACE inhibitors.    Conclusion CATH 12/24/2017    Prox LAD lesion is 95% stenosed. A drug-eluting stent was successfully placed using a STENT SYNERGY DES 2.5X28. Post intervention, there is a 0% residual stenosis. _____________ Colon Flattery 1st Mrg lesion is 80% stenosed. Ost 1st Mrg to 1st Mrg lesion is 85% stenosed. 2 Overlapping drug-eluting stents were successfully placed using a STENT SYNERGY DES 2.25X16 with a STENT SYNERGY DES 2.25X12 proximally Post intervention, there is a 0% residual stenosis. 1st Mrg lesion is 90% stenosed. Distal wire dissection Balloon angioplasty was performed using a BALLOON SAPPHIRE 2.0X15. Post intervention, there is a 0% residual stenosis. _____________ [name] lesion is 95% stenosed. Too small for PCI Ost 1st Diag lesion is 50% stenosed. The left ventricular systolic function is normal. The left ventricular ejection fraction is 55-65% by visual estimate. Normal RHC Pressures.   Severe 2 vessel PCI - pLAD 99% & ost-p OM1. -- PCI LAD 1 stent, OM1 2 overlapping DES with PTCA distal to stent 2/2 wire dissection. Essentially normal right heart cath pressures with LVEDP and wedge pressure of 11-15 mmHg.   Plan: She will be monitored overnight for post PCI complications anticipate discharge tomorrow if stable.  DAPT with aspirin and Plavix for minimum 1 year. Restart home medications, but have increase Crestor to  40 mg daily. Did not restart Lasix, could potentially give tomorrow versus the day after discharge.      Past Medical History:  Diagnosis Date   Anxiety    pt denies this dx   CAD (coronary artery disease)    1/19 PCI/DESx1 to LAD, and DESx2 to OM, normal EF via LV gram   Hyperlipidemia    Hypertension     Past  Surgical History:  Procedure Laterality Date   ABDOMINAL HYSTERECTOMY     patient denies this surgery - patient had an LAVH by Dr Radene Knee   COLONOSCOPY  2006   [facility]   CORONARY ANGIOPLASTY WITH STENT PLACEMENT  12/24/2017   CORONARY BALLOON ANGIOPLASTY N/A 12/24/2017   Procedure: CORONARY BALLOON ANGIOPLASTY;  Surgeon: Leonie Man, MD;  Location: [facility];  Service: Cardiovascular;  Laterality: N/A;   CORONARY STENT INTERVENTION N/A 12/24/2017   Procedure: CORONARY STENT INTERVENTION;  Surgeon: Leonie Man, MD;  Location: [facility];  Service: Cardiovascular;  Laterality: N/A;   DIAGNOSTIC LAPAROSCOPY     ENDOMETRIAL ABLATION W/ NOVASURE     LAPAROSCOPIC ASSISTED VAGINAL HYSTERECTOMY N/A 07/07/2013   Procedure: LAPAROSCOPIC ASSISTED VAGINAL HYSTERECTOMY;  Surgeon: Darlyn Chamber, MD;  Location: [facility];  Service: Gynecology;  Laterality: N/A;   RIGHT/LEFT HEART CATH AND CORONARY ANGIOGRAPHY N/A 12/24/2017   Procedure: RIGHT/LEFT HEART CATH AND CORONARY ANGIOGRAPHY;  Surgeon: Leonie Man, MD;  Location: [facility];  Service: Cardiovascular;  Laterality: N/A;   TONSILLECTOMY     TUBAL LIGATION     bilateral    Current Medications: Outpatient Medications Prior to Visit  Medication Sig Dispense Refill   amLODipine (NORVASC) 2.5 MG tablet Take 1 tablet (2.5 mg total) by mouth daily. 90 tablet 3   aspirin EC 81 MG tablet Take 1 tablet (81 mg total) by mouth daily. 90 tablet 3   carvedilol (COREG) 6.25 MG tablet TAKE 1 TABLET BY MOUTH TWICE A DAY 180 tablet 3   ezetimibe (ZETIA) 10 MG tablet Take 1 tablet (10 mg total) by mouth daily. 90 tablet 3   atorvastatin (LIPITOR) 80 MG tablet TAKE 1 TABLET BY MOUTH EVERY DAY 90 tablet 3   albuterol (VENTOLIN HFA) 108 (90 Base) MCG/ACT inhaler Inhale 2 puffs into the lungs every 6 (six) hours as needed for wheezing or shortness of breath. (Patient not taking: Reported on 08/08/2022) 8 g 2   hydrOXYzine (VISTARIL) 25 MG  capsule TAKE 1 CAPSULE BY MOUTH EVERY 8 HOURS AS NEEDED. (Patient not taking: Reported on 08/08/2022) 90 capsule 2   nitroGLYCERIN (NITROSTAT) 0.4 MG SL tablet Place 1 tablet (0.4 mg total) under the tongue every 5 (five) minutes as needed. (Patient not taking: Reported on 08/08/2022) 25 tablet 2   No facility-administered medications prior to visit.     Allergies:   Patient has no known allergies.   Social History   Socioeconomic History   Marital status: Married    Spouse name: Not on file   Number of children: 2   Years of education: Not on file   Highest education level: Not on file  Occupational History   Occupation: Education officer, museum  Tobacco Use   Smoking status: Never   Smokeless tobacco: Never  Vaping Use   Vaping Use: Never used  Substance and Sexual Activity   Alcohol use: Yes    Alcohol/week: 7.0 - 14.0 standard drinks of alcohol    Types: 7 - 14 Glasses of wine  per week    Comment: socially drinks wine.   Drug use: No   Sexual activity: Yes    Birth control/protection: Other-see comments    Comment: Hysterectomy - LAVH  Other Topics Concern   Not on file  Social History Narrative   Not on file   Social Determinants of Health   Financial Resource Strain: Low Risk  (09/29/2019)   Overall Financial Resource Strain (CARDIA)    Difficulty of Paying Living Expenses: Not hard at all  Food Insecurity: No Food Insecurity (02/06/2018)   Hunger Vital Sign    Worried About Running Out of Food in the Last Year: Never true    Ran Out of Food in the Last Year: Never true  Transportation Needs: No Transportation Needs (02/06/2018)   PRAPARE - Hydrologist (Medical): No    Lack of Transportation (Non-Medical): No  Physical Activity: Sufficiently Active (02/06/2018)   Exercise Vital Sign    Days of Exercise per Week: 5 days    Minutes of Exercise per Session: 40 min  Stress: Not on file  Social Connections: Unknown (09/29/2019)   Social Connection and  Isolation Panel [NHANES]    Frequency of Communication with Friends and Family: Three times a week    Frequency of Social Gatherings with Friends and Family: Twice a week    Attends Religious Services: Patient refused    Marine scientist or Organizations: Patient refused    Attends Music therapist: Patient refused    Marital Status: Married     Family History:  The patient's family history includes Coronary artery disease in her father; Diabetes in her paternal grandmother; Heart attack in her father and paternal uncle; Heart disease in her paternal uncle; Hypertension in her father; Kidney disease in her paternal grandmother; Stroke in her father; Uterine cancer in her mother.   ROS:   Please see the history of present illness.    All other systems are reviewed and are negative.   PHYSICAL EXAM:   VS:  BP 128/86 (BP Location: Left Arm, Patient Position: Sitting, Cuff Size: Normal)   Pulse 66   Ht '5\' 3"'$  (1.6 m)   Wt 150 lb 6.4 oz (68.2 kg)   LMP 04/17/2013   SpO2 98%   BMI 26.64 kg/m      General: Alert, oriented x3, no distress, appears fit.  At most is borderline overweight. Head: no evidence of trauma, PERRL, EOMI, no exophtalmos or lid lag, no myxedema, no xanthelasma; normal ears, nose and oropharynx Neck: normal jugular venous pulsations and no hepatojugular reflux; brisk carotid pulses without delay and no carotid bruits Chest: clear to auscultation, no signs of consolidation by percussion or palpation, normal fremitus, symmetrical and full respiratory excursions Cardiovascular: normal position and quality of the apical impulse, regular rhythm, normal first and second heart sounds, no murmurs, rubs or gallops Abdomen: no tenderness or distention, no masses by palpation, no abnormal pulsatility or arterial bruits, normal bowel sounds, no hepatosplenomegaly Extremities: no clubbing, cyanosis or edema; 2+ radial, ulnar and brachial pulses bilaterally; 2+ right  femoral, posterior tibial and dorsalis pedis pulses; 2+ left femoral, posterior tibial and dorsalis pedis pulses; no subclavian or femoral bruits Neurological: grossly nonfocal Psych: Normal mood and affect     Wt Readings from Last 3 Encounters:  08/08/22 150 lb 6.4 oz (68.2 kg)  03/21/22 151 lb 3.2 oz (68.6 kg)  01/12/22 156 lb 9.6 oz (71 kg)    Studies/Labs  Reviewed:   ECG stress test 01/18/2022:   horizontal ST depression in the inferior leads was noted.   The patient walked for 9 minutes and 43 seconds.  He achieved a peak heart rate of 144 which is 87% predicted maximal heart rate. The blood pressure response was hypertensive. At peak exercise he had 1 to 2 mm of ST segment depression in the inferior leads.  These ST changes resolved fairly quickly in the recovery phase.   This is interpreted as an abnormal exercise test.  There is evidence of inferior wall ischemia.  I cannot rule out the possibility that these were caused by his hypertensive response to exercise.  Nuclear stress test 02/01/2022:   The study is normal. The study is low risk.   Patient exercised according to the BRUCE protocol for 5:59 achieving 7.0 METs   Target HR was achieved (146bpm, 89% MPHR)   No ST deviation was noted.   LV perfusion is normal.   Left ventricular function is normal. Nuclear stress EF: 69 %. The left ventricular ejection fraction is hyperdynamic (>65%). End diastolic cavity size is normal.  EKG: Ordered today, personally reviewed.  Appears completely normal.  Normal sinus rhythm.  Recent Labs: 08/24/2021: BUN 11; Creatinine, Ser 0.83; Potassium 4.3; Sodium 141; TSH 2.850   Lipid Panel    Component Value Date/Time   CHOL 126 04/18/2022 0949   CHOL 147 02/13/2013 0902   TRIG 56 04/18/2022 0949   TRIG 72 02/13/2013 0902   HDL 56 04/18/2022 0949   HDL 43 02/13/2013 0902   CHOLHDL 2.3 04/18/2022 0949   CHOLHDL 3.1 08/17/2016 0937   VLDL 12 08/17/2016 0937   LDLCALC 58 04/18/2022  0949   LDLCALC 90 02/13/2013 0902    ASSESSMENT:    1. Coronary artery disease involving native coronary artery of native heart with other form of angina pectoris ([place])   2. Hypercholesterolemia   3. Benign essential HTN   4. Goiter     PLAN:   1. CAD: Asymptomatic.  History of stents to LAD and OM in 2019. "false positive" stress ECG response with normal nuclear perfusion study earlier this year. Past presentation was with dyspnea, not angina. 2. HLP: on max dose statin and zetia, LDL at target <73. 3. HTN: Diastolic blood pressure a little high today, but earlier in the year was in the 70s..  Systolic blood pressure is well within target range.  Continue carvedilol, encouraged more physical exercise, weight loss, avoid salt.  No change in medications. 4. Goiter: TSH was 2.85 last September.  Clinically euthyroid.  Medication Adjustments/Labs and Tests Ordered: Current medicines are reviewed at length with the patient today.  Concerns regarding medicines are outlined above.  Medication changes, Labs and Tests ordered today are listed in the Patient Instructions below. Patient Instructions  Medication Instructions:  No changes *If you need a refill on your cardiac medications before your next appointment, please call your pharmacy*   Lab Work: None ordered If you have labs (blood work) drawn today and your tests are completely normal, you will receive your results only by: [place] (if you have MyChart) OR A paper copy in the mail If you have any lab test that is abnormal or we need to change your treatment, we will call you to review the results.   Testing/Procedures: None ordered   Follow-Up: At [facility], you and your health needs are our priority.  As part of our continuing mission to provide you with  exceptional heart care, we have created designated Provider Care Teams.  These Care Teams include your primary Cardiologist (physician) and Advanced  Practice Providers (APPs -  Physician Assistants and Nurse Practitioners) who all work together to provide you with the care you need, when you need it.  We recommend signing up for the patient portal called "MyChart".  Sign up information is provided on this After Visit Summary.  MyChart is used to connect with patients for Virtual Visits (Telemedicine).  Patients are able to view lab/test results, encounter notes, upcoming appointments, etc.  Non-urgent messages can be sent to your provider as well.   To learn more about what you can do with MyChart, go to [email or web address].    Your next appointment:   12 month(s)  The format for your next appointment:   In Person  Provider:   Sanda Klein, MD      Important Information About Sugar         Signed, Sanda Klein, MD  08/08/2022 11:51 AM    [address] [place] [address], [place], [place]  [address] Phone: [phone]; Fax: [phone]

## 2022-08-08 ENCOUNTER — Encounter: Payer: Self-pay | Admitting: Cardiovascular Disease

## 2022-08-08 ENCOUNTER — Ambulatory Visit: Payer: 59 | Attending: Cardiovascular Disease | Admitting: Cardiovascular Disease

## 2022-08-08 VITALS — BP 128/86 | HR 66 | Ht 63.0 in | Wt 150.4 lb

## 2022-08-08 DIAGNOSIS — I10 Essential (primary) hypertension: Secondary | ICD-10-CM | POA: Diagnosis not present

## 2022-08-08 DIAGNOSIS — E049 Nontoxic goiter, unspecified: Secondary | ICD-10-CM | POA: Diagnosis not present

## 2022-08-08 DIAGNOSIS — I25118 Atherosclerotic heart disease of native coronary artery with other forms of angina pectoris: Secondary | ICD-10-CM | POA: Diagnosis not present

## 2022-08-08 DIAGNOSIS — E7800 Pure hypercholesterolemia, unspecified: Secondary | ICD-10-CM | POA: Diagnosis not present

## 2022-08-08 MED ORDER — ATORVASTATIN CALCIUM 80 MG PO TABS: 80.0000 mg | ORAL_TABLET | Freq: Every day | ORAL | 3 refills | Status: DC

## 2022-08-08 NOTE — Patient Instructions (Signed)
Medication Instructions:  No changes *If you need a refill on your cardiac medications before your next appointment, please call your pharmacy*   Lab Work: None ordered If you have labs (blood work) drawn today and your tests are completely normal, you will receive your results only by: MyChart Message (if you have MyChart) OR A paper copy in the mail If you have any lab test that is abnormal or we need to change your treatment, we will call you to review the results.   Testing/Procedures: None ordered   Follow-Up: At [facility], you and your health needs are our priority.  As part of our continuing mission to provide you with exceptional heart care, we have created designated Provider Care Teams.  These Care Teams include your primary Cardiologist (physician) and Advanced Practice Providers (APPs -  Physician Assistants and Nurse Practitioners) who all work together to provide you with the care you need, when you need it.  We recommend signing up for the patient portal called "MyChart".  Sign up information is provided on this After Visit Summary.  MyChart is used to connect with patients for Virtual Visits (Telemedicine).  Patients are able to view lab/test results, encounter notes, upcoming appointments, etc.  Non-urgent messages can be sent to your provider as well.   To learn more about what you can do with MyChart, go to [email or web address].    Your next appointment:   12 month(s)  The format for your next appointment:   In Person  Provider:   Mihai Croitoru, MD      Important Information About Sugar       

## 2022-11-02 ENCOUNTER — Encounter: Payer: Self-pay | Admitting: Psychiatry

## 2022-11-05 ENCOUNTER — Other Ambulatory Visit: Payer: Self-pay

## 2022-11-05 ENCOUNTER — Inpatient Hospital Stay: Payer: 59 | Attending: Psychiatry | Admitting: Psychiatry

## 2022-11-05 ENCOUNTER — Inpatient Hospital Stay (HOSPITAL_BASED_OUTPATIENT_CLINIC_OR_DEPARTMENT_OTHER): Payer: 59 | Admitting: Gynecologic Oncology

## 2022-11-05 ENCOUNTER — Encounter: Payer: Self-pay | Admitting: Psychiatry

## 2022-11-05 VITALS — BP 136/79 | HR 78 | Temp 98.3°F | Resp 16 | Ht 63.0 in | Wt 150.0 lb

## 2022-11-05 DIAGNOSIS — N903 Dysplasia of vulva, unspecified: Secondary | ICD-10-CM

## 2022-11-05 DIAGNOSIS — Z7982 Long term (current) use of aspirin: Secondary | ICD-10-CM | POA: Diagnosis not present

## 2022-11-05 DIAGNOSIS — I10 Essential (primary) hypertension: Secondary | ICD-10-CM | POA: Insufficient documentation

## 2022-11-05 DIAGNOSIS — Z955 Presence of coronary angioplasty implant and graft: Secondary | ICD-10-CM | POA: Diagnosis not present

## 2022-11-05 DIAGNOSIS — Z79899 Other long term (current) drug therapy: Secondary | ICD-10-CM | POA: Diagnosis not present

## 2022-11-05 DIAGNOSIS — Z8049 Family history of malignant neoplasm of other genital organs: Secondary | ICD-10-CM | POA: Insufficient documentation

## 2022-11-05 DIAGNOSIS — E785 Hyperlipidemia, unspecified: Secondary | ICD-10-CM | POA: Diagnosis not present

## 2022-11-05 DIAGNOSIS — I2510 Atherosclerotic heart disease of native coronary artery without angina pectoris: Secondary | ICD-10-CM | POA: Insufficient documentation

## 2022-11-05 DIAGNOSIS — D071 Carcinoma in situ of vulva: Secondary | ICD-10-CM | POA: Insufficient documentation

## 2022-11-05 MED ORDER — SENNOSIDES-DOCUSATE SODIUM 8.6-50 MG PO TABS: 2.0000 | ORAL_TABLET | Freq: Every day | ORAL | 0 refills | Status: DC

## 2022-11-05 MED ORDER — TRAMADOL HCL 50 MG PO TABS: 50.0000 mg | ORAL_TABLET | Freq: Four times a day (QID) | ORAL | 0 refills | Status: DC | PRN

## 2022-11-05 NOTE — H&P (View-Only) (Signed)
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  We reviewed the nature of vulvar dysplasia. Surgical excision is the mainstay of treatment, but ablative therapy or pharmacologic treatment is an option for some patients in certain clinical scenarios. The goals of treatment of vulvar dysplasia are to prevent development of vulvar squamous carcinoma and relieve any associated symptoms, such as pain or itching. The goal is also preserve vulvar anatomy as best as possible.  Excision provides both treatment and a diagnostic specimen for those women with high-grade dysplasia. Invasive squamous cell carcinoma is present at the time of excision in 10-22% of women with VIN on initial biopsy.    She has elected to proceed with surgical excision. Have additionally recommended laser for additional areas of multifocal disease on bilateral labia minora.  Patient was consented for: simple partial vulvectomy, CO2 laser ablation on 11/13/22.  The risks of surgery were discussed in detail and she understands these to including but not limited to bleeding requiring a blood transfusion, infection, injury to adjacent organs (including but not limited to the bowels, bladder, ureters, nerves, blood vessels), wound separation, unforseen complication, possible need for re-exploration, and medical complications such as heart attack, stroke, pneumonia.  If the patient experiences any of these events, she understands that her hospitalization or recovery may be prolonged and that she may need to take additional medications for a prolonged period. The patient will receive DVT and antibiotic prophylaxis as indicated. She voiced a clear understanding. She had the opportunity to ask questions and written informed consent was obtained today. She wishes to proceed.  Patient's METs are >4, however,  given her history of coronary artery disease with coronary stents in place, will request clearance from her cardiologist. All preoperative instructions were reviewed. Postoperative expectations were also reviewed.   A copy of this note was sent to the patient's referring provider.  Bernadene Bell, MD Gynecologic Oncology   Medical Decision Making I personally spent  TOTAL 46 minutes face-to-face and non-face-to-face in the care of this patient, which includes all pre, intra, and post visit time on the date of service.  3 minutes spent reviewing records prior to the visit 45 Minutes in patient contact 5 minutes charting , conferring with consultants etc.   ------------  CC: Vulvar dysplasia  HISTORY OF PRESENT ILLNESS:  Nancy [name] is a 56 y.o. woman who is seen in consultation at the request of Arvella Nigh, MD for evaluation of vulvar dysplasia.  Patient presented to her OB/GYN for an annual exam on 10/09/2022.  At that time an area of thickening and nodularity was noted in the posterior vestibular area.  She represented for biopsy on 10/16/2022.  Several biopsies of the posterior vulva were obtained.  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  Of note patient has a history of cardiovascular disease with 2 coronary artery stents in place.  Today, patient presents with her partner.  She reports itching and burning that started about a month ago but denies any bleeding. The patient denies abdominal bloating, early satiety, significant weight loss, change in bowel or bladder habits.   Of note, the patient has a history of coronary artery disease with 2 coronary stents in place.  She is followed by Sanda Klein, MD of cardiology, last seen in September.  She had a nuclear stress test performed on 02/01/2022 which was normal.  Patient  denies any new chest pain or shortness of breath and reports that she is able to walk up a flight of stairs without chest pain or shortness of  breath.   PAST MEDICAL HISTORY: Past Medical History:  Diagnosis Date   Anxiety    pt denies this dx   CAD (coronary artery disease)    1/19 PCI/DESx1 to LAD, and DESx2 to OM, normal EF via LV gram   Hyperlipidemia    Hypertension     PAST SURGICAL HISTORY: Past Surgical History:  Procedure Laterality Date   COLONOSCOPY  2006   [facility]   CORONARY ANGIOPLASTY WITH STENT PLACEMENT  12/24/2017   CORONARY BALLOON ANGIOPLASTY N/A 12/24/2017   Procedure: CORONARY BALLOON ANGIOPLASTY;  Surgeon: Leonie Man, MD;  Location: [facility];  Service: Cardiovascular;  Laterality: N/A;   CORONARY STENT INTERVENTION N/A 12/24/2017   Procedure: CORONARY STENT INTERVENTION;  Surgeon: Leonie Man, MD;  Location: [facility];  Service: Cardiovascular;  Laterality: N/A;   DIAGNOSTIC LAPAROSCOPY     ENDOMETRIAL ABLATION W/ NOVASURE     LAPAROSCOPIC ASSISTED VAGINAL HYSTERECTOMY N/A 07/07/2013   Procedure: LAPAROSCOPIC ASSISTED VAGINAL HYSTERECTOMY;  Surgeon: Darlyn Chamber, MD;  Location: [facility];  Service: Gynecology;  Laterality: N/A;   RIGHT/LEFT HEART CATH AND CORONARY ANGIOGRAPHY N/A 12/24/2017   Procedure: RIGHT/LEFT HEART CATH AND CORONARY ANGIOGRAPHY;  Surgeon: Leonie Man, MD;  Location: [facility];  Service: Cardiovascular;  Laterality: N/A;   TONSILLECTOMY     TUBAL LIGATION     bilateral    OB/GYN HISTORY: OB History  Gravida Para Term Preterm AB Living  '2 2 2     2  '$ SAB IAB Ectopic Multiple Live Births          2    # Outcome Date GA Lbr Len/2nd Weight Sex Delivery Anes PTL Lv  2 Term      Vag-Spont   LIV  1 Term      Vag-Spont   LIV      Age at menarche: 10 Age at menopause: hyst last 45s; hotflashes around age 33 Hx of HRT: no Hx of STI: no Last pap: 2016 NILM History of abnormal pap smears: no  SCREENING STUDIES:  Last mammogram: 2023 Last colonoscopy: 2022  MEDICATIONS:  Current Outpatient Medications:    aspirin EC 81 MG  tablet, Take 1 tablet (81 mg total) by mouth daily., Disp: 90 tablet, Rfl: 3   atorvastatin (LIPITOR) 80 MG tablet, Take 1 tablet (80 mg total) by mouth daily., Disp: 90 tablet, Rfl: 3   carvedilol (COREG) 6.25 MG tablet, TAKE 1 TABLET BY MOUTH TWICE A DAY, Disp: 180 tablet, Rfl: 3   ezetimibe (ZETIA) 10 MG tablet, Take 10 mg by mouth daily., Disp: , Rfl:    nitroGLYCERIN (NITROSTAT) 0.4 MG SL tablet, Place 1 tablet (0.4 mg total) under the tongue every 5 (five) minutes as needed., Disp: 25 tablet, Rfl: 2   amLODipine (NORVASC) 2.5 MG tablet, Take 1 tablet (2.5 mg total) by mouth daily., Disp: 90 tablet, Rfl: 3  ALLERGIES: No Known Allergies  FAMILY HISTORY: Family History  Problem Relation Age of Onset   Uterine cancer Mother    Coronary artery disease Father    Hypertension Father    Stroke Father    Heart attack Father    Diabetes Paternal Grandmother    Kidney disease Paternal Grandmother    Heart disease Paternal Uncle    Heart attack  Paternal Uncle    Colon cancer Neg Hx    Rectal cancer Neg Hx    Stomach cancer Neg Hx    Breast cancer Neg Hx    Ovarian cancer Neg Hx    Pancreatic cancer Neg Hx    Prostate cancer Neg Hx     SOCIAL HISTORY: Social History   Socioeconomic History   Marital status: Married    Spouse name: Not on file   Number of children: 2   Years of education: Not on file   Highest education level: Not on file  Occupational History   Occupation: Education officer, museum  Tobacco Use   Smoking status: Never   Smokeless tobacco: Never  Vaping Use   Vaping Use: Never used  Substance and Sexual Activity   Alcohol use: Yes    Alcohol/week: 7.0 - 14.0 standard drinks of alcohol    Types: 7 - 14 Glasses of wine per week    Comment: socially drinks wine.   Drug use: No   Sexual activity: Yes    Birth control/protection: Other-see comments    Comment: Hysterectomy - LAVH  Other Topics Concern   Not on file  Social History Narrative   Not on file   Social  Determinants of Health   Financial Resource Strain: Low Risk  (09/29/2019)   Overall Financial Resource Strain (CARDIA)    Difficulty of Paying Living Expenses: Not hard at all  Food Insecurity: No Food Insecurity (02/06/2018)   Hunger Vital Sign    Worried About Running Out of Food in the Last Year: Never true    Ran Out of Food in the Last Year: Never true  Transportation Needs: No Transportation Needs (02/06/2018)   PRAPARE - Hydrologist (Medical): No    Lack of Transportation (Non-Medical): No  Physical Activity: Sufficiently Active (02/06/2018)   Exercise Vital Sign    Days of Exercise per Week: 5 days    Minutes of Exercise per Session: 40 min  Stress: Not on file  Social Connections: Unknown (09/29/2019)   Social Connection and Isolation Panel [NHANES]    Frequency of Communication with Friends and Family: Three times a week    Frequency of Social Gatherings with Friends and Family: Twice a week    Attends Religious Services: Patient refused    Active Member of Clubs or Organizations: Patient refused    Attends Archivist Meetings: Patient refused    Marital Status: Married  Human resources officer Violence: Not At Risk (09/29/2019)   Humiliation, Afraid, Rape, and Kick questionnaire    Fear of Current or Ex-Partner: No    Emotionally Abused: No    Physically Abused: No    Sexually Abused: No    REVIEW OF SYSTEMS: New patient intake form was reviewed.  Complete 10-system review is negative except for the following: none  PHYSICAL EXAM: BP 136/79 (BP Location: Right Arm, Patient Position: Sitting)   Pulse 78   Temp 98.3 F (36.8 C) (Oral)   Resp 16   Ht '5\' 3"'$  (1.6 m)   Wt 150 lb (68 kg)   LMP 04/17/2013   SpO2 100%   BMI 26.57 kg/m  Constitutional: No acute distress. Neuro/Psych: Alert, oriented.  Head and Neck: Normocephalic, atraumatic. Neck symmetric without masses. Sclera anicteric.  Respiratory: Normal work of breathing. Clear  to auscultation bilaterally. Cardiovascular: Regular rate and rhythm, no murmurs, rubs, or gallops. Abdomen:Normoactive bowel sounds. Soft, non-distended, non-tender to palpation.  Extremities: Grossly normal range  of motion. Warm, well perfused. No edema bilaterally. Skin: No rashes or lesions. Lymphatic: No cervical, supraclavicular, or inguinal adenopathy. Genitourinary: External genitalia with white hyperkeratotic lesion of the posterior fourchette and anterior perineal body.  Exam chaperoned by Kimberly Martinique, CMA  VULVAR COLPOSCOPY PROCEDURE NOTE  Procedure Details: After appropriate verbal informed consent was obtained, a timeout was performed. Acetic acid was applied to the vulva and the vulva was inspected with the colposcope with the findings as noted below. The vulva was then cleansed of the acetic acid with water. The patient tolerated the procedure well.   Adequate Exam: Yes  Biopsy Specimen: None  Condition: Stable. Patient tolerated procedure well.  Complications: None  Findings: Acetowhite changes of raised, hyperkeratotic lesion of the posterior fourchette and anterior perineal body.  Additional mild acetowhite changes of bilateral medial labia minora.  Colposcopic Impression: VIN3   LABORATORY AND RADIOLOGIC DATA: Outside medical records were reviewed to synthesize the above history, along with the history and physical obtained during the visit.  Outside laboratory, pathology reports were reviewed, with pertinent results below.  I personally reviewed the outside images.  WBC  Date Value Ref Range Status  05/09/2020 6.8 3.4 - 10.8 x10E3/uL Final  12/28/2017 8.0 4.6 - 10.2 K/uL Final  12/25/2017 11.3 (H) 4.0 - 10.5 K/uL Final   Hemoglobin  Date Value Ref Range Status  05/09/2020 12.3 11.1 - 15.9 g/dL Final   Hematocrit  Date Value Ref Range Status  05/09/2020 37.9 34.0 - 46.6 % Final   Platelets  Date Value Ref Range Status  12/25/2017 234 150 - 400 K/uL  Final   Creat  Date Value Ref Range Status  08/17/2016 0.76 0.50 - 1.05 mg/dL Final    Comment:      For patients > or = 56 years of age: The upper reference limit for Creatinine is approximately 13% higher for people identified as African-American.      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  Condyloma, 1 fragment.

## 2022-11-05 NOTE — Patient Instructions (Addendum)
Preparing for your Surgery  Plan for surgery on November 13, 2022 with Dr. Bernadene Bell at [facility] will be scheduled for a pelvic examination under anesthesia, simple partial vulvectomy, laser application to the vulva.   Pre-operative Testing -You will receive a phone call from presurgical testing at [facility] to discuss surgery instructions and arrange for lab work if needed.  -Bring your insurance card, copy of an advanced directive if applicable, medication list.  -You can continue taking your baby aspirin with your last dose being the day before surgery.  -Do not take supplements such as fish oil (omega 3), red yeast rice, turmeric before your surgery. You want to avoid medications with aspirin in them including headache powders such as BC or Goody's), Excedrin migraine.  Day Before Surgery at [place] will be advised you can have clear liquids up until 3 hours before your surgery.    Your role in recovery Your role is to become active as soon as directed by your doctor, while still giving yourself time to heal.  Rest when you feel tired. You will be asked to do the following in order to speed your recovery:  - Cough and breathe deeply. This helps to clear and expand your lungs and can prevent pneumonia after surgery.  - [place]. Do mild physical activity. Walking or moving your legs help your circulation and body functions return to normal. Do not try to get up or walk alone the first time after surgery.   -If you develop swelling on one leg or the other, pain in the back of your leg, redness/warmth in one of your legs, please call the office or go to the Emergency Room to have a doppler to rule out a blood clot. For shortness of breath, chest pain-seek care in the Emergency Room as soon as possible. - Actively manage your pain. Managing your pain lets you move in comfort. We will ask you to rate your pain on a scale of zero to 10. It  is your responsibility to tell your doctor or nurse where and how much you hurt so your pain can be treated.  Special Considerations -Your final pathology results from surgery should be available around one week after surgery and the results will be relayed to you when available.  -FMLA forms can be faxed to [phone] and please allow 5-7 business days for completion.  Pain Management After Surgery -You will be prescribed your pain medication and bowel regimen medications before surgery so that you can have these available when you are discharged from the hospital. The pain medication is for use ONLY AFTER surgery and a new prescription will not be given.   -Make sure that you have Tylenol and Ibuprofen at home IF [place] to use on a regular basis after surgery for pain control. We recommend alternating the medications every hour to six hours since they work differently and are processed in the body differently for pain relief.  -Review the attached handout on narcotic use and their risks and side effects.   Bowel Regimen -You will be prescribed Sennakot-S to take nightly to prevent constipation especially if you are taking the narcotic pain medication intermittently.  It is important to prevent constipation and drink adequate amounts of liquids. You can stop taking this medication when you are not taking pain medication and you are back on your normal bowel routine.  Risks of Surgery Risks  of surgery are low but include bleeding, infection, damage to surrounding structures, re-operation, blood clots, and very rarely death.  AFTER SURGERY INSTRUCTIONS  Return to work:  2-4 weeks if applicable, variable based on occupation  We recommend purchasing several bags of frozen green peas and dividing them into ziploc bags. You will want to keep these in the freezer and have them ready to use as ice packs to the vulvar incision. Once the ice pack is no longer cold,  you can get another from the freezer. The frozen peas mold to your body better than a regular ice pack.   Activity: 1. Be up and out of the bed during the day.  Take a nap if needed.  You may walk up steps but be careful and use the hand rail.  Stair climbing will tire you more than you think, you may need to stop part way and rest.   2. No lifting or straining for 4 weeks over 10 pounds. No pushing, pulling, straining for 4 weeks.  3. No driving for minimum 24 hours after surgery, this is usually longer since you need to be able to brake quickly and safely.  Do not drive if you are taking narcotic pain medicine and make sure that your reaction time has returned.   4. You can shower as soon as the next day after surgery. Shower daily. No tub baths or submerging your body in water until cleared by your surgeon. If you have the soap that was given to you by pre-surgical testing that was used before surgery, you do not need to use it afterwards because this can irritate your incisions.   5. No sexual activity and nothing in the vagina for 4 weeks.  6. You may experience vaginal spotting and discharge after surgery.  The spotting is normal but if you experience heavy bleeding, call our office.  7. Take Tylenol or ibuprofen first for pain if you are able to take these medications and only use narcotic pain medication for severe pain not relieved by the Tylenol or Ibuprofen.  Monitor your Tylenol intake to a max of 4,000 mg in a 24 hour period. You can alternate these medications after surgery.  Diet: 1. Low sodium Heart Healthy Diet is recommended but you are cleared to resume your normal (before surgery) diet after your procedure.  2. It is safe to use a laxative, such as Miralax or Colace, if you have difficulty moving your bowels. You have been prescribed Sennakot at bedtime every evening to keep bowel movements regular and to prevent constipation.    Wound Care: 1. Keep clean and dry.  Shower  daily.  Reasons to call the Doctor: Fever - Oral temperature greater than 100.4 degrees Fahrenheit Foul-smelling vaginal discharge Difficulty urinating Nausea and vomiting Increased pain at the site of the incision that is unrelieved with pain medicine. Difficulty breathing with or without chest pain New calf pain especially if only on one side Sudden, continuing increased vaginal bleeding with or without clots.   Contacts: For questions or concerns you should contact:  Dr. Bernadene Bell at [facility], NP at [phone]  After Hours: call [phone] and have the GYN Oncologist paged/contacted (after 5 pm or on the weekends).  Messages sent via mychart are for non-urgent matters and are not responded to after hours so for urgent needs, please call the after hours number.

## 2022-11-05 NOTE — Progress Notes (Unsigned)
GYNECOLOGIC ONCOLOGY NEW PATIENT CONSULTATION  Date of Service: 11/05/2022 Referring Provider: Richardean Chimera, MD***   ASSESSMENT AND PLAN: Nancy [name] is a 56 y.o. woman with ***.  ***  A copy of this note was sent to the patient's referring provider.  Clide Cliff, MD Gynecologic Oncology   Medical Decision Making I personally spent  TOTAL *** minutes face-to-face and non-face-to-face in the care of this patient, which includes all pre, intra, and post visit time on the date of service.  *** minutes spent reviewing records prior to the visit *** Minutes in patient contact      *** minutes in other billable services *** minutes charting , conferring with consultants etc.   ------------  CC: {GynOnc Types:[id number]}  HISTORY OF PRESENT ILLNESS:  Nancy [name] is a 57 y.o. woman who is seen in consultation at the request of Richardean Chimera, MD for evaluation of ***.  Patient presented to her OB/GYN for an annual exam on 10/09/2022.  At that time an area of thickening and nodularity was noted in the posterior vestibular area.  She represented for biopsy on 10/16/2022.  Several biopsies of the posterior vulva were obtained.  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  Of note patient has a history of cardiovascular disease with 2 coronary artery stents in place.  Itching, burning - a month No bleeding  Thurmon Fair, MD - Cardiology - last seen in September Nuclear stress test normal in 02/01/22 No new chest pain, sob, mets >4 Stents placed in 2019 - no longer on plavix   Today *** The patient denies abdominal bloating, early satiety, significant weight loss, change in bowel or bladder habits.     PAST MEDICAL HISTORY: Past Medical History:  Diagnosis Date   Anxiety    pt denies this dx   CAD (coronary artery disease)    1/19 PCI/DESx1 to LAD, and DESx2 to OM, normal EF via LV gram   Hyperlipidemia    Hypertension     PAST SURGICAL HISTORY: Past Surgical  History:  Procedure Laterality Date   COLONOSCOPY  2006   [facility]   CORONARY ANGIOPLASTY WITH STENT PLACEMENT  12/24/2017   CORONARY BALLOON ANGIOPLASTY N/A 12/24/2017   Procedure: CORONARY BALLOON ANGIOPLASTY;  Surgeon: Marykay Lex, MD;  Location: [facility] INVASIVE CV LAB;  Service: Cardiovascular;  Laterality: N/A;   CORONARY STENT INTERVENTION N/A 12/24/2017   Procedure: CORONARY STENT INTERVENTION;  Surgeon: Marykay Lex, MD;  Location: [facility] INVASIVE CV LAB;  Service: Cardiovascular;  Laterality: N/A;   DIAGNOSTIC LAPAROSCOPY     ENDOMETRIAL ABLATION W/ NOVASURE     LAPAROSCOPIC ASSISTED VAGINAL HYSTERECTOMY N/A 07/07/2013   Procedure: LAPAROSCOPIC ASSISTED VAGINAL HYSTERECTOMY;  Surgeon: [name], MD;  Location: [facility] ORS;  Service: Gynecology;  Laterality: N/A;   RIGHT/LEFT HEART CATH AND CORONARY ANGIOGRAPHY N/A 12/24/2017   Procedure: RIGHT/LEFT HEART CATH AND CORONARY ANGIOGRAPHY;  Surgeon: Marykay Lex, MD;  Location: [facility] INVASIVE CV LAB;  Service: Cardiovascular;  Laterality: N/A;   TONSILLECTOMY     TUBAL LIGATION     bilateral    OB/GYN HISTORY: OB History  Gravida Para Term Preterm AB Living  2 2 2     2   SAB IAB Ectopic Multiple Live Births          2    # Outcome Date GA Lbr Len/2nd Weight Sex Delivery Anes PTL Lv  2 Term      Vag-Spont   LIV  1 Term  Vag-Spont   LIV      Age at menarche: 89 Age at menopause: hyst last 30s; hotflashes around age 68 Hx of HRT: no Hx of STI: no Last pap: 2016 NILM History of abnormal pap smears: no  SCREENING STUDIES:  Last mammogram: 2023 Last colonoscopy: 2022  MEDICATIONS:  Current Outpatient Medications:    aspirin EC 81 MG tablet, Take 1 tablet (81 mg total) by mouth daily., Disp: 90 tablet, Rfl: 3   atorvastatin (LIPITOR) 80 MG tablet, Take 1 tablet (80 mg total) by mouth daily., Disp: 90 tablet, Rfl: 3   carvedilol (COREG) 6.25 MG tablet, TAKE 1 TABLET BY MOUTH TWICE A DAY, Disp: 180 tablet, Rfl: 3    ezetimibe (ZETIA) 10 MG tablet, Take 10 mg by mouth daily., Disp: , Rfl:    nitroGLYCERIN (NITROSTAT) 0.4 MG SL tablet, Place 1 tablet (0.4 mg total) under the tongue every 5 (five) minutes as needed., Disp: 25 tablet, Rfl: 2   amLODipine (NORVASC) 2.5 MG tablet, Take 1 tablet (2.5 mg total) by mouth daily., Disp: 90 tablet, Rfl: 3  ALLERGIES: No Known Allergies  FAMILY HISTORY: Family History  Problem Relation Age of Onset   Uterine cancer Mother    Coronary artery disease Father    Hypertension Father    Stroke Father    Heart attack Father    Diabetes Paternal Grandmother    Kidney disease Paternal Grandmother    Heart disease Paternal Uncle    Heart attack Paternal Uncle    Colon cancer Neg Hx    Rectal cancer Neg Hx    Stomach cancer Neg Hx    Breast cancer Neg Hx    Ovarian cancer Neg Hx    Pancreatic cancer Neg Hx    Prostate cancer Neg Hx     SOCIAL HISTORY: Social History   Socioeconomic History   Marital status: Married    Spouse name: Not on file   Number of children: 2   Years of education: Not on file   Highest education level: Not on file  Occupational History   Occupation: Child psychotherapist  Tobacco Use   Smoking status: Never   Smokeless tobacco: Never  Vaping Use   Vaping Use: Never used  Substance and Sexual Activity   Alcohol use: Yes    Alcohol/week: 7.0 - 14.0 standard drinks of alcohol    Types: 7 - 14 Glasses of wine per week    Comment: socially drinks wine.   Drug use: No   Sexual activity: Yes    Birth control/protection: Other-see comments    Comment: Hysterectomy - LAVH  Other Topics Concern   Not on file  Social History Narrative   Not on file   Social Determinants of Health   Financial Resource Strain: Low Risk  (09/29/2019)   Overall Financial Resource Strain (CARDIA)    Difficulty of Paying Living Expenses: Not hard at all  Food Insecurity: No Food Insecurity (02/06/2018)   Hunger Vital Sign    Worried About Running Out of  Food in the Last Year: Never true    Ran Out of Food in the Last Year: Never true  Transportation Needs: No Transportation Needs (02/06/2018)   PRAPARE - Administrator, Civil Service (Medical): No    Lack of Transportation (Non-Medical): No  Physical Activity: Sufficiently Active (02/06/2018)   Exercise Vital Sign    Days of Exercise per Week: 5 days    Minutes of Exercise per Session: 40 min  Stress: Not on file  Social Connections: Unknown (09/29/2019)   Social Connection and Isolation Panel [NHANES]    Frequency of Communication with Friends and Family: Three times a week    Frequency of Social Gatherings with Friends and Family: Twice a week    Attends Religious Services: Patient refused    Active Member of Clubs or Organizations: Patient refused    Attends Banker Meetings: Patient refused    Marital Status: Married  Catering manager Violence: Not At Risk (09/29/2019)   Humiliation, Afraid, Rape, and Kick questionnaire    Fear of Current or Ex-Partner: No    Emotionally Abused: No    Physically Abused: No    Sexually Abused: No    REVIEW OF SYSTEMS: New patient intake form was reviewed.  Complete 10-system review is negative except for the following: none  PHYSICAL EXAM: BP 136/79 (BP Location: Right Arm, Patient Position: Sitting)   Pulse 78   Temp 98.3 F (36.8 C) (Oral)   Resp 16   Ht 5\' 3"  (1.6 m)   Wt 150 lb (68 kg)   LMP 04/17/2013   SpO2 100%   BMI 26.57 kg/m  Constitutional: No acute distress. Neuro/Psych: ***Alert, oriented.  Head and Neck: ***Normocephalic, atraumatic. Neck symmetric without masses. Sclera anicteric.  Respiratory: ***Normal work of breathing. ***Clear to auscultation bilaterally. Cardiovascular: ***Regular rate and rhythm, no murmurs, rubs, or gallops. Breasts: {gi breast exam list:53019} Abdomen: ***Obese. ***Normoactive bowel sounds. ***Soft, non-distended, non-tender to palpation. ***No masses or  hepatosplenomegaly appreciated. ***No evidence of hernia. ***No palpable fluid wave. ***incisions. Extremities: ***Grossly normal range of motion. Warm, well perfused. No edema bilaterally. Skin: ***No rashes or lesions. Lymphatic: ***No cervical, supraclavicular, or inguinal adenopathy. Genitourinary: ***External genitalia without lesions. Urethral meatus ***without lesions or prolapse. On speculum exam, ***vagina and cervix without lesions. Bimanual exam reveals ***. ***Rectovaginal exam confirms the above findings and reveals normal sphincter tone and ***no masses or nodularity. Exam chaperoned by ***  LABORATORY AND RADIOLOGIC DATA: ***Outside medical records were reviewed to synthesize the above history, along with the history and physical obtained during the visit.  Outside ***laboratory, pathology, and imaging reports were reviewed, with pertinent results below.  ***I personally reviewed the outside images.  WBC  Date Value Ref Range Status  05/09/2020 6.8 3.4 - 10.8 x10E3/uL Final  12/28/2017 8.0 4.6 - 10.2 K/uL Final  12/25/2017 11.3 (H) 4.0 - 10.5 K/uL Final   Hemoglobin  Date Value Ref Range Status  05/09/2020 12.3 11.1 - 15.9 g/dL Final   Hematocrit  Date Value Ref Range Status  05/09/2020 37.9 34.0 - 46.6 % Final   Platelets  Date Value Ref Range Status  12/25/2017 234 150 - 400 K/uL Final   Creat  Date Value Ref Range Status  08/17/2016 0.76 0.50 - 1.05 mg/dL Final    Comment:      For patients > or = 56 years of age: The upper reference limit for Creatinine is approximately 13% higher for people identified as African-American.      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  Condyloma, 1 fragment.

## 2022-11-06 ENCOUNTER — Encounter: Payer: Self-pay | Admitting: Psychiatry

## 2022-11-06 ENCOUNTER — Telehealth: Payer: Self-pay | Admitting: *Deleted

## 2022-11-06 MED ORDER — HYDROCODONE-ACETAMINOPHEN 5-325 MG PO TABS: 1.0000 | ORAL_TABLET | ORAL | 0 refills | Status: DC | PRN

## 2022-11-06 NOTE — Telephone Encounter (Signed)
Per Dr Ernestina Patches fax surgical optimization form to the patient's cardiology office

## 2022-11-07 NOTE — Patient Instructions (Signed)
SURGICAL WAITING ROOM VISITATION Patients having surgery or a procedure may have no more than 2 support people in the waiting area - these visitors may rotate.   Children under the age of 59 must have an adult with them who is not the patient. If the patient needs to stay at the hospital during part of their recovery, the visitor guidelines for inpatient rooms apply. Pre-op nurse will coordinate an appropriate time for 1 support person to accompany patient in pre-op.  This support person may not rotate.    Please refer to the [facility] website for the visitor guidelines for Inpatients (after your surgery is over and you are in a regular room).    Your procedure is scheduled on: 11/13/22   Report to [facility] Main Entrance    Report to admitting at 5:15 AM   Call this number if you have problems the morning of surgery [phone]   Do not eat food :After Midnight.   After Midnight you may have the following liquids until 4:30 AM DAY OF SURGERY  Water Non-Citrus Juices (without pulp, NO RED) Carbonated Beverages Black Coffee (NO MILK/CREAM OR CREAMERS, sugar ok)  Clear Tea (NO MILK/CREAM OR CREAMERS, sugar ok) regular and decaf                             Plain Jell-O (NO RED)                                           Fruit ices (not with fruit pulp, NO RED)                                     Popsicles (NO RED)                                                               Sports drinks like Gatorade (NO RED)          If you have questions, please contact your surgeon's office.   FOLLOW BOWEL PREP AND ANY ADDITIONAL PRE OP INSTRUCTIONS YOU RECEIVED FROM YOUR SURGEON'S OFFICE!!!     Oral Hygiene is also important to reduce your risk of infection.                                    Remember - BRUSH YOUR TEETH THE MORNING OF SURGERY WITH YOUR REGULAR TOOTHPASTE  DENTURES WILL BE REMOVED PRIOR TO SURGERY PLEASE DO NOT APPLY "Poly grip" OR ADHESIVES!!!   Take these  medicines the morning of surgery with A SIP OF WATER: Amlodipine, Atorvastatin, Zetia, Carvedilol                               You may not have any metal on your body including hair pins, jewelry, and body piercing             Do not wear make-up, lotions, powders, perfumes, or  deodorant  Do not wear nail polish including gel and S&S, artificial/acrylic nails, or any other type of covering on natural nails including finger and toenails. If you have artificial nails, gel coating, etc. that needs to be removed by a nail salon please have this removed prior to surgery or surgery may need to be canceled/ delayed if the surgeon/ anesthesia feels like they are unable to be safely monitored.   Do not shave  48 hours prior to surgery.    Do not bring valuables to the hospital. [place].  DO NOT [place]. PHARMACY WILL DISPENSE MEDICATIONS LISTED ON YOUR MEDICATION LIST TO YOU DURING YOUR ADMISSION [place]!    Patients discharged on the day of surgery will not be allowed to drive home.  Someone NEEDS to stay with you for the first 24 hours after anesthesia.              Please read over the following fact sheets you were given: IF [place] [phone] Schneiders    If you received a COVID test during your pre-op visit  it is requested that you wear a mask when out in public, stay away from anyone that may not be feeling well and notify your surgeon if you develop symptoms. If you test positive for Covid or have been in contact with anyone that has tested positive in the last 10 days please notify you surgeon.    [place] - Preparing for Surgery Before surgery, you can play an important role.  Because skin is not sterile, your skin needs to be as free of germs as possible.  You can reduce the number of germs on your skin by washing with CHG (chlorahexidine  gluconate) soap before surgery.  CHG is an antiseptic cleaner which kills germs and bonds with the skin to continue killing germs even after washing. Please DO NOT use if you have an allergy to CHG or antibacterial soaps.  If your skin becomes reddened/irritated stop using the CHG and inform your nurse when you arrive at Short Stay. Do not shave (including legs and underarms) for at least 48 hours prior to the first CHG shower.  You may shave your face/neck.  Please follow these instructions carefully:  1.  Shower with CHG Soap the night before surgery and the  morning of surgery.  2.  If you choose to wash your hair, wash your hair first as usual with your normal  shampoo.  3.  After you shampoo, rinse your hair and body thoroughly to remove the shampoo.                             4.  Use CHG as you would any other liquid soap.  You can apply chg directly to the skin and wash.  Gently with a scrungie or clean washcloth.  5.  Apply the CHG Soap to your body ONLY FROM THE NECK DOWN.   Do   not use on face/ open                           Wound or open sores. Avoid contact with eyes, ears mouth and   genitals (private parts).  Wash face,  Genitals (private parts) with your normal soap.             6.  Wash thoroughly, paying special attention to the area where your    surgery  will be performed.  7.  Thoroughly rinse your body with warm water from the neck down.  8.  DO NOT shower/wash with your normal soap after using and rinsing off the CHG Soap.                9.  Pat yourself dry with a clean towel.            10.  Wear clean pajamas.            11.  Place clean sheets on your bed the night of your first shower and do not  sleep with pets. Day of Surgery : Do not apply any lotions/deodorants the morning of surgery.  Please wear clean clothes to the hospital/surgery center.  FAILURE TO FOLLOW THESE INSTRUCTIONS MAY RESULT IN THE CANCELLATION OF YOUR SURGERY  PATIENT  SIGNATURE_________________________________  NURSE SIGNATURE__________________________________  ________________________________________________________________________

## 2022-11-07 NOTE — Progress Notes (Addendum)
COVID Vaccine Completed: yes  Date of COVID positive in last 90 days:  PCP - Caryl Never, NP Cardiologist - Sanda Klein, MD  Chest x-ray -  EKG - 08/08/22 Epic Stress Test - 02/01/22 Epic ECHO - 10/16/16 Epic Cardiac Cath - 12/24/17 Epic Pacemaker/ICD device last checked: Spinal Cord Stimulator:  Bowel Prep -   Sleep Study -  CPAP -   Fasting Blood Sugar -  Checks Blood Sugar _____ times a day  Last dose of GLP1 agonist-  N/A GLP1 instructions:  N/A   Last dose of SGLT-2 inhibitors-  N/A SGLT-2 instructions: N/A   Blood Thinner Instructions: Aspirin Instructions: ASA 81 Last Dose:  Activity level:  Can go up a flight of stairs and perform activities of daily living without stopping and without symptoms of chest pain or shortness of breath.  Able to exercise without symptoms  Unable to go up a flight of stairs without symptoms of     Anesthesia review: HF, HTN, CAD w/ stent, need cardiac clearance   Patient denies shortness of breath, fever, cough and chest pain at PAT appointment  Patient verbalized understanding of instructions that were given to them at the PAT appointment. Patient was also instructed that they will need to review over the PAT instructions again at home before surgery.

## 2022-11-08 ENCOUNTER — Other Ambulatory Visit: Payer: Self-pay | Admitting: Nurse Practitioner

## 2022-11-08 ENCOUNTER — Telehealth: Payer: Self-pay | Admitting: Cardiovascular Disease

## 2022-11-08 ENCOUNTER — Encounter (HOSPITAL_COMMUNITY)
Admission: RE | Admit: 2022-11-08 | Discharge: 2022-11-08 | Disposition: A | Discharge status: Home / Self Care | Payer: 59 | Source: Ambulatory Visit | Attending: Psychiatry | Admitting: Psychiatry

## 2022-11-08 ENCOUNTER — Encounter (HOSPITAL_COMMUNITY): Payer: Self-pay

## 2022-11-08 VITALS — BP 155/83 | HR 62 | Temp 98.1°F | Resp 14 | Ht 63.0 in | Wt 152.0 lb

## 2022-11-08 DIAGNOSIS — Z01812 Encounter for preprocedural laboratory examination: Secondary | ICD-10-CM | POA: Insufficient documentation

## 2022-11-08 DIAGNOSIS — I2510 Atherosclerotic heart disease of native coronary artery without angina pectoris: Secondary | ICD-10-CM

## 2022-11-08 DIAGNOSIS — N903 Dysplasia of vulva, unspecified: Secondary | ICD-10-CM | POA: Diagnosis not present

## 2022-11-08 DIAGNOSIS — I10 Essential (primary) hypertension: Secondary | ICD-10-CM | POA: Diagnosis not present

## 2022-11-08 LAB — CBC
HCT: 36.2 % (ref 36.0–46.0)
Hemoglobin: 11.9 g/dL — ABNORMAL LOW (ref 12.0–15.0)
MCH: 27.3 pg (ref 26.0–34.0)
MCHC: 32.9 g/dL (ref 30.0–36.0)
MCV: 83 fL (ref 80.0–100.0)
Platelets: 242 10*3/uL (ref 150–400)
RBC: 4.36 MIL/uL (ref 3.87–5.11)
RDW: 12.6 % (ref 11.5–15.5)
WBC: 7.4 10*3/uL (ref 4.0–10.5)
nRBC: 0 % (ref 0.0–0.2)

## 2022-11-08 LAB — BASIC METABOLIC PANEL
Anion gap: 5 (ref 5–15)
BUN: 11 mg/dL (ref 6–20)
CO2: 25 mmol/L (ref 22–32)
Calcium: 9.8 mg/dL (ref 8.9–10.3)
Chloride: 107 mmol/L (ref 98–111)
Creatinine, Ser: 0.77 mg/dL (ref 0.44–1.00)
GFR, Estimated: >60 mL/min (ref >60)
Glucose, Bld: 108 mg/dL — ABNORMAL HIGH (ref 70–99)
Potassium: 4.2 mmol/L (ref 3.5–5.1)
Sodium: 137 mmol/L (ref 135–145)

## 2022-11-08 MED ORDER — NITROGLYCERIN 0.4 MG SL SUBL: 0.4000 mg | SUBLINGUAL_TABLET | SUBLINGUAL | 2 refills | Status: DC | PRN

## 2022-11-08 NOTE — Telephone Encounter (Signed)
   Name: Nancy [name]  DOB: 08/29/1966  MRN: [id number]   Primary Cardiologist: Sanda Klein, MD  Chart reviewed as part of pre-operative protocol coverage. Patient was contacted 11/08/2022 in reference to pre-operative risk assessment for pending surgery as outlined below.  Nancy [name] was last seen on 08/08/2022 by Dr. Sallyanne Kuster.  Since that day, Nancy [name] has done well. She denies any new symptoms or concerns. She is able to complete > 4 METS without difficulty.   Therefore, based on ACC/AHA guidelines, the patient would be at acceptable risk for the planned procedure without further cardiovascular testing.   The patient was advised that if she develops new symptoms prior to surgery to contact our office to arrange for a follow-up visit, and she verbalized understanding. Pt did state her nitroglycerin has expired, refill sent to patient's pharmacy.  Per office protocol, patient may hold aspirin for 5 to 7 days prior to procedure.  Please resume aspirin as soon as possible postprocedure, at the discretion of the surgeon.  I will route this recommendation to the requesting party via Epic fax function and remove from pre-op pool. Please call with questions.  Lenna Sciara, NP 11/08/2022, 12:01 PM

## 2022-11-08 NOTE — Telephone Encounter (Signed)
   Pre-operative Risk Assessment    Patient Name: Nancy [name]  DOB: Dec 01, 1966 MRN: [id number]      Request for Surgical Clearance    Procedure:   Exam under anesthesia, simple partial vulvectomy and a laser application to the vulvar  Date of Surgery:  Clearance 11/13/22                                 Surgeon:  Dr. Bernadene Bell Surgeon's Group or Practice Name:  [facility] Phone number:  [phone]  Fax number:  [phone]   Type of Clearance Requested:   - Medical    Type of Anesthesia:  General    Additional requests/questions:   Caller stated they need medical clearance for this procedure.  Signed, Heloise Beecham   11/08/2022, 11:24 AM

## 2022-11-09 NOTE — Progress Notes (Signed)
Patient here for new patient consultation with Dr. Ernestina Patches and for a pre-operative appointment prior to her scheduled surgery on November 13, 2022. She is scheduled for a pelvic examination under anesthesia, simple partial vulvectomy, laser application to the vulva. The surgery was discussed in detail.  See after visit summary for additional details.       Discussed post-op pain management in detail including the aspects of the enhanced recovery pathway.  Advised her that a new prescription would be sent in for hydrocodone/APAP and it is only to be used for after her upcoming surgery.  We discussed the use of tylenol post-op and to monitor for a maximum of 4,000 mg in a 24 hour period.  Also prescribed sennakot to be used after surgery and to hold if having loose stools.  Discussed bowel regimen in detail.     Discussed measures to take at home to prevent DVT including frequent mobility.  Reportable signs and symptoms of DVT discussed. Post-operative instructions discussed and expectations for after surgery. Incisional care discussed as well including reportable signs and symptoms including erythema, drainage, wound separation.     5 minutes spent with the patient.  Verbalizing understanding of material discussed. No needs or concerns voiced at the end of the visit.   Advised patient to call for any needs.  Advised that her post-operative medications had been prescribed and could be picked up at any time.    This appointment is included in the global surgical bundle as pre-operative teaching and has no charge.

## 2022-11-09 NOTE — Progress Notes (Signed)
Anesthesia Chart Review   Case: [id number] Date/Time: 11/13/22 0715   Procedures:      PELVIC EXAM UNDER ANESTHESIA     VULVECTOMY PARTIAL     CO2 LASER APPLICATION TO VULVA   Anesthesia type: General   Pre-op diagnosis: VULVAR DYSPLACIA   Location: [facility] ROOM 05 / [facility]   Surgeons: Bernadene Bell, MD       DISCUSSION:56 y.o. never smoker with h/o HTN, CAD (DES), vulvar dysplasia scheduled for above procedure 11/13/2022 with Dr. Bernadene Bell.   Low risk stress test 02/01/2022.   Per cardiology preoperative evaluation 11/08/2022. Per OV note, "Chart reviewed as part of pre-operative protocol coverage. Patient was contacted 11/08/2022 in reference to pre-operative risk assessment for pending surgery as outlined below.  Nancy [name] was last seen on 08/08/2022 by Dr. Sallyanne Kuster.  Since that day, Nancy [name] has done well. She denies any new symptoms or concerns. She is able to complete > 4 METS without difficulty.    Therefore, based on ACC/AHA guidelines, the patient would be at acceptable risk for the planned procedure without further cardiovascular testing.    The patient was advised that if she develops new symptoms prior to surgery to contact our office to arrange for a follow-up visit, and she verbalized understanding. Pt did state her nitroglycerin has expired, refill sent to patient's pharmacy.   Per office protocol, patient may hold aspirin for 5 to 7 days prior to procedure.  Please resume aspirin as soon as possible postprocedure, at the discretion of the surgeon."  Anticipate pt can proceed with planned procedure barring acute status change.   VS: BP (!) 155/83   Pulse 62   Temp 36.7 C (Oral)   Resp 14   Ht '5\' 3"'$  (1.6 m)   Wt 68.9 kg   LMP 04/17/2013   SpO2 99%   BMI 26.93 kg/m   PROVIDERS: Vevelyn Francois, NP is PCP   Primary Cardiologist: Sanda Klein, MD   LABS: Labs reviewed: Acceptable for surgery. (all labs ordered are listed, but only abnormal results are  displayed)  Labs Reviewed  BASIC METABOLIC PANEL - Abnormal; Notable for the following components:      Result Value   Glucose, Bld 108 (*)    All other components within normal limits  CBC - Abnormal; Notable for the following components:   Hemoglobin 11.9 (*)    All other components within normal limits     IMAGES:   EKG:   CV: Myocardial Perfusion 02/01/2022   The study is normal. The study is low risk.   Patient exercised according to the BRUCE protocol for 5:59 achieving 7.0 METs   Target HR was achieved (146bpm, 89% MPHR)   No ST deviation was noted.   LV perfusion is normal.   Left ventricular function is normal. Nuclear stress EF: 69 %. The left ventricular ejection fraction is hyperdynamic (>65%). End diastolic cavity size is normal.  Echo 10/16/2016 Study Conclusions   - Left ventricle: The cavity size was normal. Wall thickness was    normal. Systolic function was normal. The estimated ejection    fraction was in the range of 55% to 60%. Wall motion was normal;    there were no regional wall motion abnormalities. Features are    consistent with a pseudonormal left ventricular filling pattern,    with concomitant abnormal relaxation and increased filling    pressure (grade 2 diastolic dysfunction).  Past Medical History:  Diagnosis Date  Anxiety    pt denies this dx   CAD (coronary artery disease)    1/19 PCI/DESx1 to LAD, and DESx2 to OM, normal EF via LV gram   Hyperlipidemia    Hypertension     Past Surgical History:  Procedure Laterality Date   ABDOMINAL HYSTERECTOMY     COLONOSCOPY  2006   [facility]   CORONARY ANGIOPLASTY WITH STENT PLACEMENT  12/24/2017   CORONARY BALLOON ANGIOPLASTY N/A 12/24/2017   Procedure: CORONARY BALLOON ANGIOPLASTY;  Surgeon: Leonie Man, MD;  Location: [place];  Service: Cardiovascular;  Laterality: N/A;   CORONARY STENT INTERVENTION N/A 12/24/2017   Procedure: CORONARY STENT INTERVENTION;  Surgeon: Leonie Man, MD;  Location: [facility];  Service: Cardiovascular;  Laterality: N/A;   DIAGNOSTIC LAPAROSCOPY     ENDOMETRIAL ABLATION W/ NOVASURE     LAPAROSCOPIC ASSISTED VAGINAL HYSTERECTOMY N/A 07/07/2013   Procedure: LAPAROSCOPIC ASSISTED VAGINAL HYSTERECTOMY;  Surgeon: Darlyn Chamber, MD;  Location: [facility];  Service: Gynecology;  Laterality: N/A;   RIGHT/LEFT HEART CATH AND CORONARY ANGIOGRAPHY N/A 12/24/2017   Procedure: RIGHT/LEFT HEART CATH AND CORONARY ANGIOGRAPHY;  Surgeon: Leonie Man, MD;  Location: [facility];  Service: Cardiovascular;  Laterality: N/A;   TONSILLECTOMY     TUBAL LIGATION     bilateral    MEDICATIONS:  amLODipine (NORVASC) 2.5 MG tablet   aspirin EC 81 MG tablet   atorvastatin (LIPITOR) 80 MG tablet   carvedilol (COREG) 6.25 MG tablet   ezetimibe (ZETIA) 10 MG tablet   HYDROcodone-acetaminophen (NORCO/VICODIN) 5-325 MG tablet   nitroGLYCERIN (NITROSTAT) 0.4 MG SL tablet   senna-docusate (SENOKOT-S) 8.6-50 MG tablet   No current facility-administered medications for this encounter.     Konrad Felix Ward, PA-C [facility] [phone]

## 2022-11-09 NOTE — Patient Instructions (Signed)
Preparing for your Surgery   Plan for surgery on November 13, 2022 with Dr. Bernadene Bell at [place] will be scheduled for a pelvic examination under anesthesia, simple partial vulvectomy, laser application to the vulva.    Pre-operative Testing -You will receive a phone call from presurgical testing at [facility] to discuss surgery instructions and arrange for lab work if needed.   -Bring your insurance card, copy of an advanced directive if applicable, medication list.   -You can continue taking your baby aspirin with your last dose being the day before surgery.   -Do not take supplements such as fish oil (omega 3), red yeast rice, turmeric before your surgery. You want to avoid medications with aspirin in them including headache powders such as BC or Goody's), Excedrin migraine.   Day Before Surgery at [place] will be advised you can have clear liquids up until 3 hours before your surgery.     Your role in recovery Your role is to become active as soon as directed by your doctor, while still giving yourself time to heal.  Rest when you feel tired. You will be asked to do the following in order to speed your recovery:   - Cough and breathe deeply. This helps to clear and expand your lungs and can prevent pneumonia after surgery.  - [place]. Do mild physical activity. Walking or moving your legs help your circulation and body functions return to normal. Do not try to get up or walk alone the first time after surgery.   -If you develop swelling on one leg or the other, pain in the back of your leg, redness/warmth in one of your legs, please call the office or go to the Emergency Room to have a doppler to rule out a blood clot. For shortness of breath, chest pain-seek care in the Emergency Room as soon as possible. - Actively manage your pain. Managing your pain lets you move in comfort. We will ask you to rate your pain on a scale of zero to  10. It is your responsibility to tell your doctor or nurse where and how much you hurt so your pain can be treated.   Special Considerations -Your final pathology results from surgery should be available around one week after surgery and the results will be relayed to you when available.   -FMLA forms can be faxed to [phone] and please allow 5-7 business days for completion.   Pain Management After Surgery -You will be prescribed your pain medication and bowel regimen medications before surgery so that you can have these available when you are discharged from the hospital. The pain medication is for use ONLY AFTER surgery and a new prescription will not be given.    -Make sure that you have Tylenol and Ibuprofen at home IF [place] to use on a regular basis after surgery for pain control. We recommend alternating the medications every hour to six hours since they work differently and are processed in the body differently for pain relief.   -Review the attached handout on narcotic use and their risks and side effects.    Bowel Regimen -You will be prescribed Sennakot-S to take nightly to prevent constipation especially if you are taking the narcotic pain medication intermittently.  It is important to prevent constipation and drink adequate amounts of liquids. You can stop taking this medication when you are not taking pain medication  and you are back on your normal bowel routine.   Risks of Surgery Risks of surgery are low but include bleeding, infection, damage to surrounding structures, re-operation, blood clots, and very rarely death.   AFTER SURGERY INSTRUCTIONS   Return to work:  2-4 weeks if applicable, variable based on occupation   We recommend purchasing several bags of frozen green peas and dividing them into ziploc bags. You will want to keep these in the freezer and have them ready to use as ice packs to the vulvar incision. Once the ice pack is  no longer cold, you can get another from the freezer. The frozen peas mold to your body better than a regular ice pack.    Activity: 1. Be up and out of the bed during the day.  Take a nap if needed.  You may walk up steps but be careful and use the hand rail.  Stair climbing will tire you more than you think, you may need to stop part way and rest.    2. No lifting or straining for 4 weeks over 10 pounds. No pushing, pulling, straining for 4 weeks.   3. No driving for minimum 24 hours after surgery, this is usually longer since you need to be able to brake quickly and safely.  Do not drive if you are taking narcotic pain medicine and make sure that your reaction time has returned.    4. You can shower as soon as the next day after surgery. Shower daily. No tub baths or submerging your body in water until cleared by your surgeon. If you have the soap that was given to you by pre-surgical testing that was used before surgery, you do not need to use it afterwards because this can irritate your incisions.    5. No sexual activity and nothing in the vagina for 4 weeks.   6. You may experience vaginal spotting and discharge after surgery.  The spotting is normal but if you experience heavy bleeding, call our office.   7. Take Tylenol or ibuprofen first for pain if you are able to take these medications and only use narcotic pain medication for severe pain not relieved by the Tylenol or Ibuprofen.  Monitor your Tylenol intake to a max of 4,000 mg in a 24 hour period. You can alternate these medications after surgery.   Diet: 1. Low sodium Heart Healthy Diet is recommended but you are cleared to resume your normal (before surgery) diet after your procedure.   2. It is safe to use a laxative, such as Miralax or Colace, if you have difficulty moving your bowels. You have been prescribed Sennakot at bedtime every evening to keep bowel movements regular and to prevent constipation.     Wound Care: 1.  Keep clean and dry.  Shower daily.   Reasons to call the Doctor: Fever - Oral temperature greater than 100.4 degrees Fahrenheit Foul-smelling vaginal discharge Difficulty urinating Nausea and vomiting Increased pain at the site of the incision that is unrelieved with pain medicine. Difficulty breathing with or without chest pain New calf pain especially if only on one side Sudden, continuing increased vaginal bleeding with or without clots.   Contacts: For questions or concerns you should contact:   Dr. Bernadene Bell at [place], NP at [phone]   After Hours: call [phone] and have the GYN Oncologist paged/contacted (after 5 pm or on the weekends).   Messages sent via mychart are for non-urgent matters and  are not responded to after hours so for urgent needs, please call the after hours number.

## 2022-11-09 NOTE — Anesthesia Preprocedure Evaluation (Signed)
Anesthesia Evaluation  Patient identified by MRN, date of birth, ID band Patient awake    Reviewed: Allergy & Precautions, NPO status , Patient's Chart, lab work & pertinent test results, reviewed documented beta blocker date and time   Airway Mallampati: II  TM Distance: >3 FB Neck ROM: Full    Dental no notable dental hx. (+) Teeth Intact, Dental Advisory Given   Pulmonary neg pulmonary ROS   Pulmonary exam normal breath sounds clear to auscultation       Cardiovascular hypertension (124/83 in preop), Pt. on medications and Pt. on home beta blockers + CAD and + Cardiac Stents (2019 DES)  Normal cardiovascular exam Rhythm:Regular Rate:Normal  Myocardial Perfusion 02/01/2022   The study is normal. The study is low risk.   Patient exercised according to the BRUCE protocol for 5:59 achieving 7.0 METs   Target HR was achieved (146bpm, 89% MPHR)   No ST deviation was noted.   LV perfusion is normal.   Left ventricular function is normal. Nuclear stress EF: 69 %. The left ventricular ejection fraction is hyperdynamic (>65%). End diastolic cavity size is normal.   Echo 10/16/2016 - Left ventricle: The cavity size was normal. Wall thickness was    normal. Systolic function was normal. The estimated ejection    fraction was in the range of 55% to 60%. Wall motion was normal;    there were no regional wall motion abnormalities. Features are    consistent with a pseudonormal left ventricular filling pattern,    with concomitant abnormal relaxation and increased filling    pressure (grade 2 diastolic dysfunction).     Neuro/Psych  PSYCHIATRIC DISORDERS Anxiety Depression    negative neurological ROS     GI/Hepatic negative GI ROS, Neg liver ROS,,,  Endo/Other  negative endocrine ROS    Renal/GU negative Renal ROS  negative genitourinary   Musculoskeletal negative musculoskeletal ROS (+)    Abdominal   Peds   Hematology negative hematology ROS (+) Hb 11.9   Anesthesia Other Findings   Reproductive/Obstetrics Vulvar dysplasia                              Anesthesia Physical Anesthesia Plan  ASA: 2  Anesthesia Plan: General   Post-op Pain Management: Tylenol PO (pre-op)*, Toradol IV (intra-op)*, Dilaudid IV and Ketamine IV*   Induction: Intravenous  PONV Risk Score and Plan: 4 or greater and Ondansetron, Dexamethasone, Midazolam and Treatment may vary due to age or medical condition  Airway Management Planned: LMA  Additional Equipment: None  Intra-op Plan:   Post-operative Plan: Extubation in OR  Informed Consent: I have reviewed the patients History and Physical, chart, labs and discussed the procedure including the risks, benefits and alternatives for the proposed anesthesia with the patient or authorized representative who has indicated his/her understanding and acceptance.     Dental advisory given  Plan Discussed with: CRNA  Anesthesia Plan Comments:        Anesthesia Quick Evaluation

## 2022-11-12 ENCOUNTER — Telehealth: Payer: Self-pay | Admitting: Surgery

## 2022-11-12 NOTE — Telephone Encounter (Signed)
Telephone call to check on pre-operative status.  Patient compliant with pre-operative instructions.  Reinforced nothing to eat after midnight. Clear liquids until 4:15am. Patient to arrive at 5:15am. Verified that post-op medications have been sent to patient's preferred pharmacy.  No questions or concerns voiced.  Instructed to call for any needs. 

## 2022-11-13 ENCOUNTER — Ambulatory Visit (HOSPITAL_COMMUNITY): Payer: 59 | Admitting: Physician Assistant

## 2022-11-13 ENCOUNTER — Ambulatory Visit (HOSPITAL_BASED_OUTPATIENT_CLINIC_OR_DEPARTMENT_OTHER): Payer: 59 | Admitting: Anesthesiology

## 2022-11-13 ENCOUNTER — Ambulatory Visit (HOSPITAL_COMMUNITY)
Admission: RE | Admit: 2022-11-13 | Discharge: 2022-11-13 | Disposition: A | Discharge status: Home / Self Care | Payer: 59 | Source: Ambulatory Visit | Attending: Psychiatry | Admitting: Psychiatry

## 2022-11-13 ENCOUNTER — Encounter (HOSPITAL_COMMUNITY)
Admission: RE | Disposition: A | Discharge status: Home / Self Care | Payer: Self-pay | Source: Ambulatory Visit | Attending: Psychiatry

## 2022-11-13 ENCOUNTER — Other Ambulatory Visit: Payer: Self-pay

## 2022-11-13 ENCOUNTER — Encounter (HOSPITAL_COMMUNITY): Payer: Self-pay | Admitting: Psychiatry

## 2022-11-13 DIAGNOSIS — I10 Essential (primary) hypertension: Secondary | ICD-10-CM | POA: Diagnosis not present

## 2022-11-13 DIAGNOSIS — D071 Carcinoma in situ of vulva: Secondary | ICD-10-CM

## 2022-11-13 DIAGNOSIS — I2510 Atherosclerotic heart disease of native coronary artery without angina pectoris: Secondary | ICD-10-CM | POA: Diagnosis not present

## 2022-11-13 DIAGNOSIS — N903 Dysplasia of vulva, unspecified: Secondary | ICD-10-CM

## 2022-11-13 DIAGNOSIS — Z955 Presence of coronary angioplasty implant and graft: Secondary | ICD-10-CM | POA: Diagnosis not present

## 2022-11-13 HISTORY — PX: VULVECTOMY PARTIAL: SHX6187

## 2022-11-13 SURGERY — EXAM UNDER ANESTHESIA
Anesthesia: General | Site: Vulva

## 2022-11-13 MED ORDER — LIDOCAINE HCL (CARDIAC) PF 100 MG/5ML IV SOSY
PREFILLED_SYRINGE | INTRAVENOUS | Status: DC | PRN
  Administered 2022-11-13: 60 mg via INTRAVENOUS

## 2022-11-13 MED ORDER — AMISULPRIDE (ANTIEMETIC) 5 MG/2ML IV SOLN: 10.0000 mg | Freq: Once | INTRAVENOUS | Status: DC | PRN

## 2022-11-13 MED ORDER — OXYCODONE HCL 5 MG PO TABS: 5.0000 mg | ORAL_TABLET | Freq: Once | ORAL | Status: DC | PRN

## 2022-11-13 MED ORDER — DEXAMETHASONE SODIUM PHOSPHATE 10 MG/ML IJ SOLN
INTRAMUSCULAR | Status: DC | PRN
  Administered 2022-11-13: 8 mg via INTRAVENOUS

## 2022-11-13 MED ORDER — MIDAZOLAM HCL 5 MG/5ML IJ SOLN
INTRAMUSCULAR | Status: DC | PRN
  Administered 2022-11-13 (×2): 1 mg via INTRAVENOUS

## 2022-11-13 MED ORDER — MIDAZOLAM HCL 2 MG/2ML IJ SOLN
INTRAMUSCULAR | Status: AC
  Filled 2022-11-13: qty 2

## 2022-11-13 MED ORDER — LACTATED RINGERS IV SOLN: INTRAVENOUS | Status: DC

## 2022-11-13 MED ORDER — LIDOCAINE HCL 1 % IJ SOLN
INTRAMUSCULAR | Status: DC | PRN
  Administered 2022-11-13: 10 mL

## 2022-11-13 MED ORDER — LIDOCAINE HCL (PF) 2 % IJ SOLN
INTRAMUSCULAR | Status: AC
  Filled 2022-11-13: qty 10

## 2022-11-13 MED ORDER — ACETIC ACID 5 % SOLN
Status: DC | PRN
  Administered 2022-11-13: 1 via TOPICAL

## 2022-11-13 MED ORDER — ACETAMINOPHEN 500 MG PO TABS
1000.0000 mg | ORAL_TABLET | ORAL | Status: AC
  Administered 2022-11-13: 1000 mg via ORAL
  Filled 2022-11-13: qty 2

## 2022-11-13 MED ORDER — ONDANSETRON HCL 4 MG/2ML IJ SOLN
INTRAMUSCULAR | Status: AC
  Filled 2022-11-13: qty 2

## 2022-11-13 MED ORDER — PHENYLEPHRINE HCL-NACL 20-0.9 MG/250ML-% IV SOLN
INTRAVENOUS | Status: AC
  Filled 2022-11-13: qty 250

## 2022-11-13 MED ORDER — ROCURONIUM BROMIDE 10 MG/ML (PF) SYRINGE
PREFILLED_SYRINGE | INTRAVENOUS | Status: AC
  Filled 2022-11-13: qty 10

## 2022-11-13 MED ORDER — DEXMEDETOMIDINE HCL IN NACL 80 MCG/20ML IV SOLN
INTRAVENOUS | Status: DC | PRN
  Administered 2022-11-13: 8 ug via BUCCAL

## 2022-11-13 MED ORDER — PROPOFOL 10 MG/ML IV BOLUS
INTRAVENOUS | Status: DC | PRN
  Administered 2022-11-13: 150 mg via INTRAVENOUS

## 2022-11-13 MED ORDER — ONDANSETRON HCL 4 MG/2ML IJ SOLN: 4.0000 mg | Freq: Once | INTRAMUSCULAR | Status: DC | PRN

## 2022-11-13 MED ORDER — ONDANSETRON HCL 4 MG/2ML IJ SOLN
INTRAMUSCULAR | Status: DC | PRN
  Administered 2022-11-13: 4 mg via INTRAVENOUS

## 2022-11-13 MED ORDER — 0.9 % SODIUM CHLORIDE (POUR BTL) OPTIME
TOPICAL | Status: DC | PRN
  Administered 2022-11-13: 1000 mL

## 2022-11-13 MED ORDER — ROCURONIUM BROMIDE 100 MG/10ML IV SOLN
INTRAVENOUS | Status: DC | PRN
  Administered 2022-11-13: 60 mg via INTRAVENOUS

## 2022-11-13 MED ORDER — FENTANYL CITRATE (PF) 250 MCG/5ML IJ SOLN
INTRAMUSCULAR | Status: AC
  Filled 2022-11-13: qty 5

## 2022-11-13 MED ORDER — PHENYLEPHRINE HCL-NACL 20-0.9 MG/250ML-% IV SOLN
INTRAVENOUS | Status: DC | PRN
  Administered 2022-11-13: 30 ug/min via INTRAVENOUS

## 2022-11-13 MED ORDER — LIDOCAINE HCL (PF) 2 % IJ SOLN
INTRAMUSCULAR | Status: DC | PRN
  Administered 2022-11-13: 1.5 mg/kg/h via INTRADERMAL

## 2022-11-13 MED ORDER — FENTANYL CITRATE (PF) 100 MCG/2ML IJ SOLN
INTRAMUSCULAR | Status: DC | PRN
  Administered 2022-11-13 (×4): 50 ug via INTRAVENOUS

## 2022-11-13 MED ORDER — ACETAMINOPHEN 500 MG PO TABS: 1000.0000 mg | ORAL_TABLET | Freq: Once | ORAL | Status: DC

## 2022-11-13 MED ORDER — DEXAMETHASONE SODIUM PHOSPHATE 10 MG/ML IJ SOLN
INTRAMUSCULAR | Status: AC
  Filled 2022-11-13: qty 1

## 2022-11-13 MED ORDER — DEXAMETHASONE SODIUM PHOSPHATE 4 MG/ML IJ SOLN: 4.0000 mg | INTRAMUSCULAR | Status: DC

## 2022-11-13 MED ORDER — ORAL CARE MOUTH RINSE: 15.0000 mL | Freq: Once | OROMUCOSAL | Status: AC

## 2022-11-13 MED ORDER — LIDOCAINE HCL (PF) 2 % IJ SOLN
INTRAMUSCULAR | Status: AC
  Filled 2022-11-13: qty 5

## 2022-11-13 MED ORDER — ACETIC ACID 5 % SOLN
Status: AC
  Filled 2022-11-13: qty 50

## 2022-11-13 MED ORDER — OXYCODONE HCL 5 MG/5ML PO SOLN: 5.0000 mg | Freq: Once | ORAL | Status: DC | PRN

## 2022-11-13 MED ORDER — SUGAMMADEX SODIUM 200 MG/2ML IV SOLN
INTRAVENOUS | Status: DC | PRN
  Administered 2022-11-13: 200 mg via INTRAVENOUS

## 2022-11-13 MED ORDER — HYDROMORPHONE HCL 1 MG/ML IJ SOLN: 0.2500 mg | INTRAMUSCULAR | Status: DC | PRN

## 2022-11-13 MED ORDER — DEXMEDETOMIDINE HCL IN NACL 80 MCG/20ML IV SOLN
INTRAVENOUS | Status: AC
  Filled 2022-11-13: qty 20

## 2022-11-13 MED ORDER — LIDOCAINE HCL (PF) 1 % IJ SOLN
INTRAMUSCULAR | Status: AC
  Filled 2022-11-13: qty 30

## 2022-11-13 MED ORDER — CHLORHEXIDINE GLUCONATE 0.12 % MT SOLN
15.0000 mL | Freq: Once | OROMUCOSAL | Status: AC
  Administered 2022-11-13: 15 mL via OROMUCOSAL

## 2022-11-13 MED ORDER — FERRIC SUBSULFATE 259 MG/GM EX SOLN
CUTANEOUS | Status: AC
  Filled 2022-11-13: qty 8

## 2022-11-13 MED ORDER — PROPOFOL 10 MG/ML IV BOLUS
INTRAVENOUS | Status: AC
  Filled 2022-11-13: qty 20

## 2022-11-13 SURGICAL SUPPLY — 43 items
BLADE SURG 15 STRL LF DISP TIS (BLADE) ×1 IMPLANT
BLADE SURG 15 STRL SS (BLADE) ×2
BLADE SURG SZ11 CARB STEEL (BLADE) IMPLANT
CATH ROBINSON RED A/P 16FR (CATHETERS) ×1 IMPLANT
DEPRESSOR TONGUE 6 IN STERILE (GAUZE/BANDAGES/DRESSINGS) ×3 IMPLANT
DRAPE UNDERBUTTOCKS STRL (DISPOSABLE) IMPLANT
DRSG TELFA 3X8 NADH STRL (GAUZE/BANDAGES/DRESSINGS) IMPLANT
ELECT BALL LEEP 3MM BLK (ELECTRODE) IMPLANT
GAUZE 4X4 16PLY ~~LOC~~+RFID DBL (SPONGE) ×4 IMPLANT
GLOVE BIO SURGEON STRL SZ 6 (GLOVE) ×6 IMPLANT
GOWN STRL REUS W/ TWL LRG LVL3 (GOWN DISPOSABLE) ×5 IMPLANT
GOWN STRL REUS W/TWL LRG LVL3 (GOWN DISPOSABLE) ×6
KIT TURNOVER KIT A (KITS) ×3 IMPLANT
NDL HYPO 25X1 1.5 SAFETY (NEEDLE) ×2 IMPLANT
NDL SPNL 22GX7 QUINCKE BK (NEEDLE) IMPLANT
NEEDLE HYPO 22GX1.5 SAFETY (NEEDLE) IMPLANT
NEEDLE HYPO 25X1 1.5 SAFETY (NEEDLE) ×2 IMPLANT
NEEDLE SPNL 22GX7 QUINCKE BK (NEEDLE) IMPLANT
PACK LITHOTOMY IV (CUSTOM PROCEDURE TRAY) ×2 IMPLANT
PACK PERINEAL COLD (PAD) ×3 IMPLANT
PACK VAGINAL WOMENS (CUSTOM PROCEDURE TRAY) ×3 IMPLANT
PAD PREP 24X48 CUFFED NSTRL (MISCELLANEOUS) ×3 IMPLANT
PUNCH BIOPSY 3 (MISCELLANEOUS) IMPLANT
SCOPETTES 8  STERILE (MISCELLANEOUS) ×2
SCOPETTES 8 STERILE (MISCELLANEOUS) ×5 IMPLANT
SOL PREP POV-IOD 4OZ 10% (MISCELLANEOUS) ×3 IMPLANT
SUT VIC AB 0 CT1 36 (SUTURE) IMPLANT
SUT VIC AB 2-0 CT1 27 (SUTURE) ×2
SUT VIC AB 2-0 CT1 27XBRD (SUTURE) ×1 IMPLANT
SUT VIC AB 2-0 SH 27 (SUTURE) ×2
SUT VIC AB 2-0 SH 27XBRD (SUTURE) ×1 IMPLANT
SUT VIC AB 3-0 PS2 18 (SUTURE)
SUT VIC AB 3-0 PS2 18XBRD (SUTURE) IMPLANT
SUT VIC AB 3-0 SH 27 (SUTURE)
SUT VIC AB 3-0 SH 27X BRD (SUTURE) IMPLANT
SUT VIC AB 4-0 PS2 18 (SUTURE) ×2 IMPLANT
SUT VICRYL 0 UR6 27IN ABS (SUTURE) IMPLANT
SYR BULB IRRIG 60ML STRL (SYRINGE) ×3 IMPLANT
SYR CONTROL 10ML LL (SYRINGE) ×3 IMPLANT
TOWEL OR 17X26 10 PK STRL BLUE (TOWEL DISPOSABLE) ×6 IMPLANT
TUBING CONNECTING 10 (TUBING) ×1 IMPLANT
VACUUM HOSE 7/8X10 W/ WAND (MISCELLANEOUS) ×3 IMPLANT
WATER STERILE IRR 500ML POUR (IV SOLUTION) ×3 IMPLANT

## 2022-11-13 NOTE — Op Note (Signed)
GYNECOLOGIC ONCOLOGY OPERATIVE NOTE  Date of Service: 11/13/2022  Preoperative Diagnosis: VIN3  Postoperative Diagnosis: Same  Procedures: Simple partial vulvectomy  Surgeon: Bernadene Bell, MD  Assistants: Joylene John, NP  Anesthesia: General  Estimated Blood Loss: 20 mL    Urine Output: 200 ml, clear yellow  Findings: Thorough exam of the vulva demonstrates a raised, irregular hyperkeratotic lesion on the posterior fourchette, nearly abutting the vaginal mucosa, and extending onto the anterior perineal body and the inferior most aspect of bilateral labia. Acetowhite changes of this lesion with acetic acid applied. No additional multifocal lesions noted.  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  Prior to the procedure, all risks, benefits, and alternatives were discussed and informed surgical consent was signed.  Procedure: Patient was taken to the operating room where general anesthesia was achieved.  She was positioned in dorsal lithotomy and prepped and draped.  An in and out catheter was inserted into the bladder and clear yellow urine was drained.  A thorough exam of the vulva was done after placing a moist sponge with acetic acid on the vulva with the findings as noted above. The area to be removed was outlined with a marking pen. A scalpel was used to incise around the lesion. Allis clamps are used to grasp the lesion and a skinning vulvectomy was performed in the standard fashion with aid of cautery. After the lesion was removed, the wound bed was made hemostatic with cautery. Several deep 2-0 vicryl suture was placed to bring the wound edges together followed by 3-0 vicryl deep dermal stitches and 4-0 vicryl  mattress sutures on the skin edges to re-appoximate the wound. Hemostasis was noted at the end of the procedure.   Patient tolerated the procedure well. Sponge, lap, and instrument counts were correct.  No perioperative antibiotics were indicated for this procedure.  Patient was extubated and taken to the PACU in stable condition.  Bernadene Bell, MD Gynecologic Oncology

## 2022-11-13 NOTE — Anesthesia Procedure Notes (Signed)
Procedure Name: Intubation Date/Time: 11/13/2022 7:32 AM  Performed by: Garrel Ridgel, CRNAPre-anesthesia Checklist: Patient identified, Emergency Drugs available, Suction available and Patient being monitored Patient Re-evaluated:Patient Re-evaluated prior to induction Oxygen Delivery Method: Circle system utilized Preoxygenation: Pre-oxygenation with 100% oxygen Induction Type: IV induction Ventilation: Mask ventilation without difficulty Laryngoscope Size: Mac and 3 Grade View: Grade I Tube type: Oral Tube size: 7.0 mm Number of attempts: 1 Airway Equipment and Method: Stylet Placement Confirmation: ETT inserted through vocal cords under direct vision, positive ETCO2 and breath sounds checked- equal and bilateral Secured at: 22 cm Tube secured with: Tape Dental Injury: Teeth and Oropharynx as per pre-operative assessment

## 2022-11-13 NOTE — Anesthesia Postprocedure Evaluation (Signed)
Anesthesia Post Note  Patient: Nancy [name]  Procedure(s) Performed: PELVIC EXAM UNDER ANESTHESIA (Vulva) VULVECTOMY PARTIAL (Vulva)     Patient location during evaluation: PACU Anesthesia Type: General Level of consciousness: awake and alert, oriented and patient cooperative Pain management: pain level controlled Vital Signs Assessment: post-procedure vital signs reviewed and stable Respiratory status: spontaneous breathing, nonlabored ventilation and respiratory function stable Cardiovascular status: blood pressure returned to baseline and stable Postop Assessment: no apparent nausea or vomiting Anesthetic complications: no   No notable events documented.  Last Vitals:  Vitals:   11/13/22 0850 11/13/22 0900  BP: 117/68 106/63  Pulse: 75 73  Resp: 12 13  Temp: (!) 35.7 C 36.4 C  SpO2: 100% 100%    Last Pain:  Vitals:   11/13/22 0900  TempSrc:   PainSc: 0-No pain                 Pervis Hocking

## 2022-11-13 NOTE — Transfer of Care (Signed)
Immediate Anesthesia Transfer of Care Note  Patient: Nancy [name]  Procedure(s) Performed: PELVIC EXAM UNDER ANESTHESIA (Vulva) VULVECTOMY PARTIAL (Vulva)  Patient Location: PACU  Anesthesia Type:General  Level of Consciousness: oriented, drowsy, and patient cooperative  Airway & Oxygen Therapy: Patient Spontanous Breathing and Patient connected to face mask oxygen  Post-op Assessment: Report given to RN and Post -op Vital signs reviewed and stable  Post vital signs: Reviewed and stable  Last Vitals:  Vitals Value Taken Time  BP 117/68 11/13/22 0850  Temp 35.7 C 11/13/22 0850  Pulse 71 11/13/22 0854  Resp 0 11/13/22 0854  SpO2 100 % 11/13/22 0854  Vitals shown include unvalidated device data.  Last Pain:  Vitals:   11/13/22 0850  TempSrc:   PainSc: Asleep      Patients Stated Pain Goal: 4 (41/03/01 3143)  Complications: No notable events documented.

## 2022-11-13 NOTE — Discharge Instructions (Addendum)
AFTER SURGERY INSTRUCTIONS   Return to work:  2-4 weeks if applicable, variable based on occupation   We recommend purchasing several bags of frozen green peas and dividing them into ziploc bags. You will want to keep these in the freezer and have them ready to use as ice packs to the vulvar incision. Once the ice pack is no longer cold, you can get another from the freezer. The frozen peas mold to your body better than a regular ice pack.    Activity: 1. Be up and out of the bed during the day.  Take a nap if needed.  You may walk up steps but be careful and use the hand rail.  Stair climbing will tire you more than you think, you may need to stop part way and rest.    2. No lifting or straining for 4 weeks over 10 pounds. No pushing, pulling, straining for 4 weeks.   3. No driving for minimum 24 hours after surgery, this is usually longer since you need to be able to brake quickly and safely.  Do not drive if you are taking narcotic pain medicine and make sure that your reaction time has returned.    4. You can shower as soon as the next day after surgery. Shower daily. No tub baths or submerging your body in water until cleared by your surgeon. If you have the soap that was given to you by pre-surgical testing that was used before surgery, you do not need to use it afterwards because this can irritate your incisions.    5. No sexual activity and nothing in the vagina for 4 weeks.   6. You may experience vaginal spotting and discharge after surgery.  The spotting is normal but if you experience heavy bleeding, call our office.   7. Take Tylenol or ibuprofen first for pain if you are able to take these medications and only use narcotic pain medication for severe pain not relieved by the Tylenol or Ibuprofen.  Monitor your Tylenol intake to a max of 4,000 mg in a 24 hour period. You can alternate these medications after surgery.   Diet: 1. Low sodium Heart Healthy Diet is recommended but you  are cleared to resume your normal (before surgery) diet after your procedure.   2. It is safe to use a laxative, such as Miralax or Colace, if you have difficulty moving your bowels. You have been prescribed Sennakot at bedtime every evening to keep bowel movements regular and to prevent constipation.     Wound Care: 1. Keep clean and dry.  Shower daily.   Reasons to call the Doctor: Fever - Oral temperature greater than 100.4 degrees Fahrenheit Foul-smelling vaginal discharge Difficulty urinating Nausea and vomiting Increased pain at the site of the incision that is unrelieved with pain medicine. Difficulty breathing with or without chest pain New calf pain especially if only on one side Sudden, continuing increased vaginal bleeding with or without clots.   Contacts: For questions or concerns you should contact:   Dr. Bernadene Bell at [place], [place] at [phone]   After Hours: call [phone] and have the GYN Oncologist paged/contacted (after 5 pm or on the weekends).   Messages sent via mychart are for non-urgent matters and are not responded to after hours so for urgent needs, please call the after hours number.   Follow these guidelines to help manage your pain at home.  Take your medicine(s) as directed and as needed.  Call your healthcare provider if the medicine prescribed for you does not help your pain. Do not drive or drink alcohol while you're taking prescription pain medicine. Some prescription pain medicines can make you drowsy (very sleepy). Alcohol can make the drowsiness worse. You'll have less pain and need less pain medicine as your incision heals. An over-the-counter pain reliever will help with aches and discomfort. Acetaminophen (Tylenol) and ibuprofen (Advil or Motrin) are examples of over-the-counter pain relievers. Follow your healthcare provider's instructions for stopping your prescription pain medicine. Do not take too much of  any medicine. Follow the instructions on the label or from your healthcare provider. Read the labels on all the medicines you're taking. This is very important if you're taking acetaminophen. Acetaminophen is an ingredient in many over-the-counter and prescription medicines. Taking too much can harm your liver. Do not take more than one medicine that has acetaminophen without talking with a member of your care team. Pain medicine should help you get back to your normal activities. Take enough to do your activities and exercises comfortably. You may have a little more pain as you start to be more active. To keep your incisions clean:  Use your peri-bottle to wash your vulvar and anal areas with warm water. Do this after every time you urinate or have a bowel movement. Use a sitz bath or hand spray shower at least 2 times every day. Shower every day, using soap and water. To dry your incisions, pat them dry with a clean towel or use the "cool" setting on a hair dryer. Do not rub your incisions.  Here are some tips to make you more comfortable while your incision(s) are healing:  Apply ice packs to the affected area as directed. Wear loose fitting clothing. Wear underwear that's a size larger or men's boxer shorts. Keep the area open to air at night. Showering Take a shower every day to clean your incisions. If you have staples or stitches in your incisions, it's OK to get them wet.  If you have a mesh dressing over your surgical site, leave it on when you shower. If you have white cloth dressings, take them off before you shower.  Use soap during your shower, but do not put it directly on your incision. Do not rub the area around your incision.  After you shower, pat the area dry with a clean towel. If your clothing may rub your incision, cover it with a small bandage. Otherwise, leave it uncovered.  Do not take a bath for the first 4 weeks after your surgery.  Call your healthcare provider  if:  You have a fever of 100.5 F (38 C) or higher. You have chills. The area around your incision is starting to swell. Swelling around your incision is getting worse. You have increased discomfort in the area of your incisions. You have drainage or a foul odor (bad smell) from your incisions. You have bleeding from your incisions. You have trouble urinating (peeing). You have any problems you didn't expect. You have any questions or concerns. Contact information Monday through Friday from 9 a.m. to 5 p.m., call your healthcare provider's office.  After 5 p.m., during the weekend, and on holidays, call [phone]. Ask to talk with the person on call for Dr. Bernadene Bell.

## 2022-11-13 NOTE — Interval H&P Note (Signed)
History and Physical Interval Note:  11/13/2022 7:15 AM  Nancy [name]  has presented today for surgery, with the diagnosis of VULVAR DYSPLACIA.  The various methods of treatment have been discussed with the patient and family. After consideration of risks, benefits and other options for treatment, the patient has consented to  Procedure(s): PELVIC EXAM UNDER ANESTHESIA (N/A) VULVECTOMY PARTIAL (N/A) CO2 LASER APPLICATION TO VULVA (N/A) as a surgical intervention.  The patient's history has been reviewed, patient examined, no change in status, stable for surgery.  I have reviewed the patient's chart and labs.  Questions were answered to the patient's satisfaction.     Lj Miyamoto

## 2022-11-14 ENCOUNTER — Telehealth: Payer: Self-pay | Admitting: *Deleted

## 2022-11-14 ENCOUNTER — Encounter (HOSPITAL_COMMUNITY): Payer: Self-pay | Admitting: Psychiatry

## 2022-11-14 NOTE — Telephone Encounter (Signed)
Spoke with Nancy [name] thi morning. She states that she is eating, drinking and urinating well. She is passing gas and had a BM. She denies fever or chills. She is using peribottle when urinating . She denies pain, discharge or bleeding from incision. She rates her pain at 1/10. She has not taken any medication for pain. INstructed to call the office with any fever, chills purulent drainage, uncontrolled pain or other questions or concerns. Pt verbalizes understanding. Pt aware of post op appointments as well as office number [phone].

## 2022-11-15 LAB — SURGICAL PATHOLOGY

## 2022-11-28 ENCOUNTER — Telehealth: Payer: Self-pay | Admitting: *Deleted

## 2022-11-28 NOTE — Telephone Encounter (Signed)
LMOM for the patient to call the office back. Need to move her appt from 1/10 to 1/3

## 2022-11-29 NOTE — Telephone Encounter (Signed)
Spoke with the patient and moved the appt on 1/10 to an earlier time

## 2022-12-12 ENCOUNTER — Other Ambulatory Visit: Payer: Self-pay

## 2022-12-12 ENCOUNTER — Inpatient Hospital Stay: Payer: 59 | Attending: Psychiatry | Admitting: Psychiatry

## 2022-12-12 VITALS — BP 143/81 | HR 58 | Temp 98.7°F | Resp 14 | Ht 63.0 in | Wt 156.8 lb

## 2022-12-12 DIAGNOSIS — Z9079 Acquired absence of other genital organ(s): Secondary | ICD-10-CM | POA: Diagnosis not present

## 2022-12-12 DIAGNOSIS — D071 Carcinoma in situ of vulva: Secondary | ICD-10-CM | POA: Diagnosis not present

## 2022-12-12 DIAGNOSIS — Z7189 Other specified counseling: Secondary | ICD-10-CM

## 2022-12-12 DIAGNOSIS — N903 Dysplasia of vulva, unspecified: Secondary | ICD-10-CM

## 2022-12-12 NOTE — Patient Instructions (Signed)
It was a pleasure to see you in clinic today. - You are healing well. - If you have any concerns with healing or new lesions between now and your next visit, let us know. - Return visit planned for 6 months. And you should plan to see your local Ob/Gyn in about a year.  Thank you very much for allowing me to provide care for you today.  I appreciate your confidence in choosing our Gynecologic Oncology team at [facility].  If you have any questions about your visit today please call our office or send [place] a MyChart message and we will get back to you as soon as possible.

## 2022-12-12 NOTE — Progress Notes (Signed)
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  Postop: - Pt recovering well from surgery and healing appropriately postoperatively - Intraoperative findings and pathology results reviewed. - Ongoing postoperative expectations and precautions reviewed. - Okay to return to work.  VIN3: - No invasive cancer noted. Positive vaginal facing margin. No gross disease noted at this margin. - Reviewed the nature of vulvar dysplasia, risk of recurrence. - Recommend pelvic exam q6 months for 5 years. - Will have patient return in 6 months but could alternate with her local ob/gyn in the future for exams. - Signs/symptoms of recurrence of vulvar dysplasia reviewed.    RTC 6 months.  Bernadene Bell, MD Gynecologic Oncology   Medical Decision Making I personally spent  TOTAL 25 minutes face-to-face and non-face-to-face in the care of this patient, which includes all pre, intra, and post visit time on the date of service.   ----------------------- Reason for Visit: Postop/treatment counseling  Treatment History: Patient presented to her OB/GYN for an annual exam on 10/09/2022.  At that time an area of thickening and nodularity was noted in the posterior vestibular area.  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 She underwent a simple partial vulvectomy on 11/13/22 which noted VIN3 with positive vaginal facing margin.  Interval History: Pt reports that she is recovering well from surgery. She is no longer having any pain. She is eating and drinking well. She is voiding without issue and having regular bowel movements. She notes occasional spotting when she wipes and burning on the outside when she pees. Using peri bottle when she voids helps with this.   Past Medical/Surgical History: Past  Medical History:  Diagnosis Date   Anxiety    pt denies this dx   CAD (coronary artery disease)    1/19 PCI/DESx1 to LAD, and DESx2 to OM, normal EF via LV gram   Hyperlipidemia    Hypertension     Past Surgical History:  Procedure Laterality Date   ABDOMINAL HYSTERECTOMY     COLONOSCOPY  2006   [facility]   CORONARY ANGIOPLASTY WITH STENT PLACEMENT  12/24/2017   CORONARY BALLOON ANGIOPLASTY N/A 12/24/2017   Procedure: CORONARY BALLOON ANGIOPLASTY;  Surgeon: Leonie Man, MD;  Location: [place] [facility];  Service: Cardiovascular;  Laterality: N/A;   CORONARY STENT INTERVENTION N/A 12/24/2017   Procedure: CORONARY STENT INTERVENTION;  Surgeon: Leonie Man, MD;  Location: [place] [facility];  Service: Cardiovascular;  Laterality: N/A;   DIAGNOSTIC LAPAROSCOPY     ENDOMETRIAL ABLATION W/ NOVASURE     LAPAROSCOPIC ASSISTED VAGINAL HYSTERECTOMY N/A 07/07/2013   Procedure: LAPAROSCOPIC ASSISTED VAGINAL HYSTERECTOMY;  Surgeon: Darlyn Chamber, MD;  Location: [facility];  Service: Gynecology;  Laterality: N/A;   RIGHT/LEFT HEART CATH AND CORONARY ANGIOGRAPHY N/A 12/24/2017   Procedure: RIGHT/LEFT HEART CATH AND CORONARY ANGIOGRAPHY;  Surgeon: Leonie Man, MD;  Location: [place] [facility];  Service: Cardiovascular;  Laterality: N/A;   TONSILLECTOMY     TUBAL LIGATION     bilateral   VULVECTOMY PARTIAL N/A 11/13/2022   Procedure: VULVECTOMY PARTIAL;  Surgeon: Bernadene Bell, MD;  Location: [facility];  Service: Gynecology;  Laterality: N/A;    Family History  Problem Relation Age of Onset   Uterine cancer Mother    Coronary artery disease Father  Hypertension Father    Stroke Father    Heart attack Father    Diabetes Paternal Grandmother    Kidney disease Paternal Grandmother    Heart disease Paternal Uncle    Heart attack Paternal Uncle    Colon cancer Neg Hx    Rectal cancer Neg Hx    Stomach cancer Neg Hx    Breast cancer Neg Hx    Ovarian cancer Neg Hx    Pancreatic  cancer Neg Hx    Prostate cancer Neg Hx     Social History   Socioeconomic History   Marital status: Married    Spouse name: Not on file   Number of children: 2   Years of education: Not on file   Highest education level: Not on file  Occupational History   Occupation: Education officer, museum  Tobacco Use   Smoking status: Never   Smokeless tobacco: Never  Vaping Use   Vaping Use: Never used  Substance and Sexual Activity   Alcohol use: Yes    Alcohol/week: 7.0 - 14.0 standard drinks of alcohol    Types: 7 - 14 Glasses of wine per week    Comment: socially drinks wine.   Drug use: No   Sexual activity: Yes    Birth control/protection: Other-see comments    Comment: Hysterectomy - LAVH  Other Topics Concern   Not on file  Social History Narrative   Not on file   Social Determinants of Health   Financial Resource Strain: Low Risk  (09/29/2019)   Overall Financial Resource Strain (CARDIA)    Difficulty of Paying Living Expenses: Not hard at all  Food Insecurity: No Food Insecurity (02/06/2018)   Hunger Vital Sign    Worried About Running Out of Food in the Last Year: Never true    Ran Out of Food in the Last Year: Never true  Transportation Needs: No Transportation Needs (02/06/2018)   PRAPARE - Hydrologist (Medical): No    Lack of Transportation (Non-Medical): No  Physical Activity: Sufficiently Active (02/06/2018)   Exercise Vital Sign    Days of Exercise per Week: 5 days    Minutes of Exercise per Session: 40 min  Stress: Not on file  Social Connections: Unknown (09/29/2019)   Social Connection and Isolation Panel [NHANES]    Frequency of Communication with Friends and Family: Three times a week    Frequency of Social Gatherings with Friends and Family: Twice a week    Attends Religious Services: Patient refused    Marine scientist or Organizations: Patient refused    Attends Archivist Meetings: Patient refused    Marital Status:  Married    Current Medications:  Current Outpatient Medications:    amLODipine (NORVASC) 2.5 MG tablet, Take 1 tablet (2.5 mg total) by mouth daily., Disp: 90 tablet, Rfl: 3   aspirin EC 81 MG tablet, Take 1 tablet (81 mg total) by mouth daily., Disp: 90 tablet, Rfl: 3   atorvastatin (LIPITOR) 80 MG tablet, Take 1 tablet (80 mg total) by mouth daily., Disp: 90 tablet, Rfl: 3   carvedilol (COREG) 6.25 MG tablet, TAKE 1 TABLET BY MOUTH TWICE A DAY (Patient taking differently: Take 6.25 mg by mouth daily.), Disp: 180 tablet, Rfl: 3   ezetimibe (ZETIA) 10 MG tablet, Take 10 mg by mouth daily., Disp: , Rfl:    nitroGLYCERIN (NITROSTAT) 0.4 MG SL tablet, Place 1 tablet (0.4 mg total) under the tongue every 5 (  five) minutes as needed., Disp: 25 tablet, Rfl: 2  Review of Symptoms: Complete 10-system review is negative except as above in Interval History.  Physical Exam: BP (!) 143/81 (BP Location: Left Arm, Patient Position: Sitting)   Pulse (!) 58   Temp 98.7 F (37.1 C) (Oral)   Resp 14   Ht '5\' 3"'$  (1.6 m)   Wt 156 lb 12.8 oz (71.1 kg)   LMP 04/17/2013   SpO2 100%   BMI 27.78 kg/m  General: Alert, oriented, no acute distress. HEENT: Normocephalic, atraumatic. Neck symmetric without masses. Sclera anicteric.  Chest: Normal work of breathing. Clear to auscultation bilaterally.   Cardiovascular: Regular rate and rhythm, no murmurs. Abdomen: Soft, nontender.  Normoactive bowel sounds.   Extremities: Grossly normal range of motion.  Warm, well perfused.  No edema bilaterally. Skin: No rashes or lesions noted. GU: External genitalia with superficial separation of posterior vulva wound with healthy granulation tissue and superficial exposures of some sutures. Overall healing well. Exam chaperoned by Joylene John, NP   Laboratory & Radiologic Studies: Surgical pathology (11/13/22): FINAL MICROSCOPIC DIAGNOSIS:   A. VULVA, PARTIAL VULVECTOMY:  - High-grade squamous intraepithelial lesion  (VIN3, high grade  dysplasia)  - No evidence of invasive carcinoma  - High-grade dysplasia involves the vaginal facing resection margin  (inked blue)  - The peritoneal facing resection margin and the deep margin are  negative

## 2022-12-16 ENCOUNTER — Encounter: Payer: Self-pay | Admitting: Psychiatry

## 2023-01-14 ENCOUNTER — Other Ambulatory Visit: Payer: Self-pay | Admitting: Cardiovascular Disease

## 2023-01-22 ENCOUNTER — Telehealth: Payer: Self-pay | Admitting: Pharmacist

## 2023-01-22 NOTE — Telephone Encounter (Signed)
Contacted patient for hypertension follow up. She notes she still sees [facility] in [place] and blood pressures are well controlled at those appointments. No needs at this time.  Catie Hedwig Morton, PharmD, [place], [facility] [phone]

## 2023-02-10 ENCOUNTER — Other Ambulatory Visit: Payer: Self-pay | Admitting: Cardiovascular Disease

## 2023-02-10 DIAGNOSIS — I25118 Atherosclerotic heart disease of native coronary artery with other forms of angina pectoris: Secondary | ICD-10-CM

## 2023-05-27 ENCOUNTER — Inpatient Hospital Stay: Payer: 59 | Attending: Psychiatry | Admitting: Psychiatry

## 2023-05-27 ENCOUNTER — Encounter: Payer: Self-pay | Admitting: Psychiatry

## 2023-05-27 VITALS — BP 129/83 | HR 78 | Temp 97.7°F | Ht 63.78 in | Wt 151.8 lb

## 2023-05-27 DIAGNOSIS — Z9079 Acquired absence of other genital organ(s): Secondary | ICD-10-CM | POA: Diagnosis not present

## 2023-05-27 DIAGNOSIS — Z86002 Personal history of in-situ neoplasm of other and unspecified genital organs: Secondary | ICD-10-CM | POA: Insufficient documentation

## 2023-05-27 DIAGNOSIS — N903 Dysplasia of vulva, unspecified: Secondary | ICD-10-CM

## 2023-05-27 NOTE — Patient Instructions (Signed)
It was a pleasure to see you in clinic today. - Exam is normal today - Return visit planned for 67mo  Thank you very much for allowing me to provide care for you today.  I appreciate your confidence in choosing our Gynecologic Oncology team at [facility].  If you have any questions about your visit today please call our office or send [place] a MyChart message and we will get back to you as soon as possible.

## 2023-05-27 NOTE — Progress Notes (Signed)
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  VIN3: - NED on exam today - Recommend pelvic exam q6 months 2 years then annually. - After 2 years, would be safe to return to routine annual Ob/Gyn care. - Signs/symptoms of recurrence of vulvar dysplasia reviewed.    RTC 6 months.  Clide Cliff, MD Gynecologic Oncology   Medical Decision Making I personally spent  TOTAL 20 minutes face-to-face and non-face-to-face in the care of this patient, which includes all pre, intra, and post visit time on the date of service.   ----------------------- Reason for Visit: Surveillance  Treatment History: Patient presented to her OB/GYN for an annual exam on 10/09/2022.  At that time an area of thickening and nodularity was noted in the posterior vestibular area.  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 She underwent a simple partial vulvectomy on 11/13/22 which noted VIN3 with positive vaginal facing margin.  Interval History: No major concerns today. Denies concerns over any new lesions or vulvar itching.  Reports occasional light bleeding when she wipes, but this has not happened in a while.  Past Medical/Surgical History: Past Medical History:  Diagnosis Date   Anxiety    pt denies this dx   CAD (coronary artery disease)    1/19 PCI/DESx1 to LAD, and DESx2 to OM, normal EF via LV gram   Hyperlipidemia    Hypertension     Past Surgical History:  Procedure Laterality Date   ABDOMINAL HYSTERECTOMY     COLONOSCOPY  2006   [facility]   CORONARY ANGIOPLASTY WITH STENT PLACEMENT  12/24/2017   CORONARY BALLOON ANGIOPLASTY N/A 12/24/2017   Procedure: CORONARY BALLOON ANGIOPLASTY;  Surgeon: Marykay Lex, MD;  Location: [facility] INVASIVE CV LAB;  Service: Cardiovascular;   Laterality: N/A;   CORONARY STENT INTERVENTION N/A 12/24/2017   Procedure: CORONARY STENT INTERVENTION;  Surgeon: Marykay Lex, MD;  Location: [facility] INVASIVE CV LAB;  Service: Cardiovascular;  Laterality: N/A;   DIAGNOSTIC LAPAROSCOPY     ENDOMETRIAL ABLATION W/ NOVASURE     LAPAROSCOPIC ASSISTED VAGINAL HYSTERECTOMY N/A 07/07/2013   Procedure: LAPAROSCOPIC ASSISTED VAGINAL HYSTERECTOMY;  Surgeon: [name], MD;  Location: [facility];  Service: Gynecology;  Laterality: N/A;   RIGHT/LEFT HEART CATH AND CORONARY ANGIOGRAPHY N/A 12/24/2017   Procedure: RIGHT/LEFT HEART CATH AND CORONARY ANGIOGRAPHY;  Surgeon: Marykay Lex, MD;  Location: [facility] INVASIVE CV LAB;  Service: Cardiovascular;  Laterality: N/A;   TONSILLECTOMY     TUBAL LIGATION     bilateral   VULVECTOMY PARTIAL N/A 11/13/2022   Procedure: VULVECTOMY PARTIAL;  Surgeon: Clide Cliff, MD;  Location: [facility];  Service: Gynecology;  Laterality: N/A;    Family History  Problem Relation Age of Onset   Uterine cancer Mother    Coronary artery disease Father    Hypertension Father    Stroke Father    Heart attack Father    Diabetes Paternal Grandmother    Kidney disease Paternal Grandmother    Heart disease Paternal Uncle    Heart attack Paternal Uncle    Colon cancer Neg Hx    Rectal cancer Neg Hx    Stomach cancer Neg Hx    Breast cancer Neg Hx    Ovarian cancer Neg Hx    Pancreatic cancer Neg Hx  Prostate cancer Neg Hx     Social History   Socioeconomic History   Marital status: Married    Spouse name: Not on file   Number of children: 2   Years of education: Not on file   Highest education level: Not on file  Occupational History   Occupation: Child psychotherapist  Tobacco Use   Smoking status: Never   Smokeless tobacco: Never  Vaping Use   Vaping Use: Never used  Substance and Sexual Activity   Alcohol use: Yes    Alcohol/week: 7.0 - 14.0 standard drinks of alcohol    Types: 7 - 14 Glasses of wine per week     Comment: socially drinks wine.   Drug use: No   Sexual activity: Yes    Birth control/protection: Other-see comments    Comment: Hysterectomy - LAVH  Other Topics Concern   Not on file  Social History Narrative   Not on file   Social Determinants of Health   Financial Resource Strain: Low Risk  (09/29/2019)   Overall Financial Resource Strain (CARDIA)    Difficulty of Paying Living Expenses: Not hard at all  Food Insecurity: No Food Insecurity (02/06/2018)   Hunger Vital Sign    Worried About Running Out of Food in the Last Year: Never true    Ran Out of Food in the Last Year: Never true  Transportation Needs: No Transportation Needs (02/06/2018)   PRAPARE - Administrator, Civil Service (Medical): No    Lack of Transportation (Non-Medical): No  Physical Activity: Sufficiently Active (02/06/2018)   Exercise Vital Sign    Days of Exercise per Week: 5 days    Minutes of Exercise per Session: 40 min  Stress: Not on file  Social Connections: Unknown (09/29/2019)   Social Connection and Isolation Panel [NHANES]    Frequency of Communication with Friends and Family: Three times a week    Frequency of Social Gatherings with Friends and Family: Twice a week    Attends Religious Services: Patient declined    Database administrator or Organizations: Patient declined    Attends Engineer, structural: Patient declined    Marital Status: Married    Current Medications:  Current Outpatient Medications:    amLODipine (NORVASC) 2.5 MG tablet, TAKE 1 TABLET BY MOUTH EVERY DAY, Disp: 90 tablet, Rfl: 1   aspirin EC 81 MG tablet, Take 1 tablet (81 mg total) by mouth daily., Disp: 90 tablet, Rfl: 3   atorvastatin (LIPITOR) 80 MG tablet, Take 1 tablet (80 mg total) by mouth daily., Disp: 90 tablet, Rfl: 3   carvedilol (COREG) 6.25 MG tablet, TAKE 1 TABLET BY MOUTH TWICE A DAY (Patient taking differently: Take 6.25 mg by mouth daily.), Disp: 180 tablet, Rfl: 3   ezetimibe (ZETIA)  10 MG tablet, TAKE 1 TABLET BY MOUTH EVERY DAY, Disp: 30 tablet, Rfl: 6   nitroGLYCERIN (NITROSTAT) 0.4 MG SL tablet, Place 1 tablet (0.4 mg total) under the tongue every 5 (five) minutes as needed., Disp: 25 tablet, Rfl: 2  Review of Symptoms: Complete 10-system review is negative except as above in Interval History.  Physical Exam: BP 129/83 (BP Location: Left Arm, Patient Position: Sitting)   Pulse 78   Temp 97.7 F (36.5 C) (Oral)   Ht 5' 3.78" (1.62 m)   Wt 151 lb 12.8 oz (68.9 kg)   LMP 04/17/2013   SpO2 100%   BMI 26.24 kg/m  General: Alert, oriented, no acute distress. HEENT:  Normocephalic, atraumatic. Neck symmetric without masses. Sclera anicteric.  Chest: Normal work of breathing. Clear to auscultation bilaterally.   Cardiovascular: Regular rate and rhythm, no murmurs. Abdomen: Soft, nontender.  Normoactive bowel sounds.   Extremities: Grossly normal range of motion.  Warm, well perfused.  No edema bilaterally. Skin: No rashes or lesions noted. Lymphatics: No inguinal adenopathy GU: External genitalia with with well-healed posterior vulva from prior vulvectomy.  No lesions on exam.  Speculum exam with normal vaginal mucosa.  Bimanual exam with smooth cuff, no pelvic masses.  Exam chaperoned by Leta Baptist, RN    Laboratory & Radiologic Studies: None

## 2023-09-08 ENCOUNTER — Other Ambulatory Visit: Payer: Self-pay | Admitting: Cardiovascular Disease

## 2023-09-08 DIAGNOSIS — I25118 Atherosclerotic heart disease of native coronary artery with other forms of angina pectoris: Secondary | ICD-10-CM

## 2023-10-21 ENCOUNTER — Other Ambulatory Visit: Payer: Self-pay | Admitting: Cardiovascular Disease

## 2023-11-15 ENCOUNTER — Ambulatory Visit: Payer: 59 | Attending: Cardiovascular Disease | Admitting: Cardiovascular Disease

## 2023-11-15 ENCOUNTER — Encounter: Payer: Self-pay | Admitting: Cardiovascular Disease

## 2023-11-15 VITALS — BP 139/88 | HR 63 | Ht 63.0 in | Wt 153.6 lb

## 2023-11-15 DIAGNOSIS — I10 Essential (primary) hypertension: Secondary | ICD-10-CM

## 2023-11-15 DIAGNOSIS — E7800 Pure hypercholesterolemia, unspecified: Secondary | ICD-10-CM | POA: Diagnosis not present

## 2023-11-15 DIAGNOSIS — E049 Nontoxic goiter, unspecified: Secondary | ICD-10-CM | POA: Diagnosis not present

## 2023-11-15 DIAGNOSIS — I2510 Atherosclerotic heart disease of native coronary artery without angina pectoris: Secondary | ICD-10-CM

## 2023-11-15 NOTE — Patient Instructions (Signed)

## 2023-11-15 NOTE — Progress Notes (Signed)
Cardiology Office Note    Date:  11/15/2023   ID:  Nancy [name], DOB 01/07/1966, MRN [id number]  PCP:  Nancy [name], [facility]  Cardiologist:   Thurmon Fair, MD   Chief Complaint  Patient presents with   Coronary Artery Disease    History of Present Illness:  Nancy [name] is a 57 y.o. female with a strong family history of premature coronary artery disease but without any other coronary risk factors, presenting with complaints of exertional dyspnea, found to have coronary artery disease and received drug-eluting stents to the second oblique marginal and  the LAD artery in January 2019.  In addition had a high-grade stenosis in the ramus intermedius, but this vessel was too small for PCI.  35% stenosis was seen in the RCA.  Right heart catheterization showed essentially normal pressures.  Had a "false positive"treadmill ECG test in February, followed by normal findings on a myocardial perfusion study in March 2023.  She is feeling well and she remains physically active, although she is not exercising as much is in the past.  She walks 2-3 times a week because she does not like the cold weather.  She does not have exertional dyspnea, orthopnea, PND, lower extremity edema, palpitations, dizziness, syncope, focal neurological complaints or intermittent claudication.  Her blood pressure has been a little higher than in the past.  In June it was 129/83.  Today was 139/88.  She has been taking her antihypertensive medication and has not skipped the carvedilol.  Her metabolic profile is not as favorable.  For a while she missed taking her atorvastatin when the prescription ran out and her LDL was 118.  The other lipid parameters were excellent with triglycerides 87 and HDL 62.  She is now back on atorvastatin as well as ezetimibe.  Echo 2017 showed left ventricular wall thickness and systolic function was normal, but there was evidence of diastolic dysfunction  Her father had heart disease  starting in his 78s and has undergone both stents and bypass surgery. Her mother started having coronary problems in her 81s and had an aborted infarction with percutaneous revascularization.   Has a dry cough with ACE inhibitors.    Conclusion CATH 12/24/2017    Prox LAD lesion is 95% stenosed. A drug-eluting stent was successfully placed using a STENT SYNERGY DES 2.5X28. Post intervention, there is a 0% residual stenosis. _____________ Suezanne Jacquet 1st Mrg lesion is 80% stenosed. Ost 1st Mrg to 1st Mrg lesion is 85% stenosed. 2 Overlapping drug-eluting stents were successfully placed using a STENT SYNERGY DES 2.25X16 with a STENT SYNERGY DES 2.25X12 proximally Post intervention, there is a 0% residual stenosis. 1st Mrg lesion is 90% stenosed. Distal wire dissection Balloon angioplasty was performed using a BALLOON SAPPHIRE 2.0X15. Post intervention, there is a 0% residual stenosis. _____________ Para March lesion is 95% stenosed. Too small for PCI Ost 1st Diag lesion is 50% stenosed. The left ventricular systolic function is normal. The left ventricular ejection fraction is 55-65% by visual estimate. Normal RHC Pressures.   Severe 2 vessel PCI - pLAD 99% & ost-p OM1. -- PCI LAD 1 stent, OM1 2 overlapping DES with PTCA distal to stent 2/2 wire dissection. Essentially normal right heart cath pressures with LVEDP and wedge pressure of 11-15 mmHg.   Plan: She will be monitored overnight for post PCI complications anticipate discharge tomorrow if stable.  DAPT with aspirin and Plavix for minimum 1 year. Restart home medications, but have increase Crestor to 40 mg  daily. Did not restart Lasix, could potentially give tomorrow versus the day after discharge.      Past Medical History:  Diagnosis Date   Anxiety    pt denies this dx   CAD (coronary artery disease)    1/19 PCI/DESx1 to LAD, and DESx2 to OM, normal EF via LV gram   Hyperlipidemia    Hypertension     Past Surgical History:   Procedure Laterality Date   ABDOMINAL HYSTERECTOMY     COLONOSCOPY  2006   [facility]   CORONARY ANGIOPLASTY WITH STENT PLACEMENT  12/24/2017   CORONARY BALLOON ANGIOPLASTY N/A 12/24/2017   Procedure: CORONARY BALLOON ANGIOPLASTY;  Surgeon: Marykay Lex, MD;  Location: [facility] INVASIVE CV LAB;  Service: Cardiovascular;  Laterality: N/A;   CORONARY STENT INTERVENTION N/A 12/24/2017   Procedure: CORONARY STENT INTERVENTION;  Surgeon: Marykay Lex, MD;  Location: [facility] INVASIVE CV LAB;  Service: Cardiovascular;  Laterality: N/A;   DIAGNOSTIC LAPAROSCOPY     ENDOMETRIAL ABLATION W/ NOVASURE     LAPAROSCOPIC ASSISTED VAGINAL HYSTERECTOMY N/A 07/07/2013   Procedure: LAPAROSCOPIC ASSISTED VAGINAL HYSTERECTOMY;  Surgeon: [name], MD;  Location: [facility];  Service: Gynecology;  Laterality: N/A;   RIGHT/LEFT HEART CATH AND CORONARY ANGIOGRAPHY N/A 12/24/2017   Procedure: RIGHT/LEFT HEART CATH AND CORONARY ANGIOGRAPHY;  Surgeon: Marykay Lex, MD;  Location: [facility] INVASIVE CV LAB;  Service: Cardiovascular;  Laterality: N/A;   TONSILLECTOMY     TUBAL LIGATION     bilateral   VULVECTOMY PARTIAL N/A 11/13/2022   Procedure: VULVECTOMY PARTIAL;  Surgeon: Clide Cliff, MD;  Location: [facility];  Service: Gynecology;  Laterality: N/A;    Current Medications: Outpatient Medications Prior to Visit  Medication Sig Dispense Refill   amLODipine (NORVASC) 2.5 MG tablet TAKE 1 TABLET BY MOUTH EVERY DAY 30 tablet 5   aspirin EC 81 MG tablet Take 1 tablet (81 mg total) by mouth daily. 90 tablet 3   atorvastatin (LIPITOR) 80 MG tablet Take 1 tablet (80 mg total) by mouth daily. Please keep scheduled appointment for future refills. Thank you. 30 tablet 0   carvedilol (COREG) 6.25 MG tablet Take 1 tablet (6.25 mg total) by mouth 2 (two) times daily. Please keep scheduled appointment for future refills. Thank you. 60 tablet 0   ezetimibe (ZETIA) 10 MG tablet TAKE 1 TABLET BY MOUTH EVERY DAY 30 tablet 6    nitroGLYCERIN (NITROSTAT) 0.4 MG SL tablet Place 1 tablet (0.4 mg total) under the tongue every 5 (five) minutes as needed. (Patient not taking: Reported on 11/15/2023) 25 tablet 2   No facility-administered medications prior to visit.     Allergies:   Patient has no known allergies.   Social History   Socioeconomic History   Marital status: Married    Spouse name: Not on file   Number of children: 2   Years of education: Not on file   Highest education level: Not on file  Occupational History   Occupation: Child psychotherapist  Tobacco Use   Smoking status: Never   Smokeless tobacco: Never  Vaping Use   Vaping status: Never Used  Substance and Sexual Activity   Alcohol use: Yes    Alcohol/week: 7.0 - 14.0 standard drinks of alcohol    Types: 7 - 14 Glasses of wine per week    Comment: socially drinks wine.   Drug use: No   Sexual activity: Yes    Birth control/protection: Other-see comments    Comment: Hysterectomy - LAVH  Other Topics Concern   Not on file  Social History Narrative   Not on file   Social Drivers of Health   Financial Resource Strain: Low Risk  (09/29/2019)   Overall Financial Resource Strain (CARDIA)    Difficulty of Paying Living Expenses: Not hard at all  Food Insecurity: No Food Insecurity (02/06/2018)   Hunger Vital Sign    Worried About Running Out of Food in the Last Year: Never true    Ran Out of Food in the Last Year: Never true  Transportation Needs: No Transportation Needs (02/06/2018)   PRAPARE - Administrator, Civil Service (Medical): No    Lack of Transportation (Non-Medical): No  Physical Activity: Sufficiently Active (02/06/2018)   Exercise Vital Sign    Days of Exercise per Week: 5 days    Minutes of Exercise per Session: 40 min  Stress: Not on file  Social Connections: Unknown (09/29/2019)   Social Connection and Isolation Panel [NHANES]    Frequency of Communication with Friends and Family: Three times a week    Frequency of  Social Gatherings with Friends and Family: Twice a week    Attends Religious Services: Patient declined    Database administrator or Organizations: Patient declined    Attends Engineer, structural: Patient declined    Marital Status: Married     Family History:  The patient's family history includes Coronary artery disease in her father; Diabetes in her paternal grandmother; Heart attack in her father and paternal uncle; Heart disease in her paternal uncle; Hypertension in her father; Kidney disease in her paternal grandmother; Stroke in her father; Uterine cancer in her mother.   ROS:   Please see the history of present illness.    All other systems are reviewed and are negative.   PHYSICAL EXAM:   VS:  BP 139/88   Pulse 63   Ht 5\' 3"  (1.6 m)   Wt 153 lb 9.6 oz (69.7 kg)   LMP 04/17/2013   SpO2 97%   BMI 27.21 kg/m      General: Alert, oriented x3, no distress, appears fit.  At most is borderline overweight. Head: no evidence of trauma, PERRL, EOMI, no exophtalmos or lid lag, no myxedema, no xanthelasma; normal ears, nose and oropharynx Neck: normal jugular venous pulsations and no hepatojugular reflux; brisk carotid pulses without delay and no carotid bruits Chest: clear to auscultation, no signs of consolidation by percussion or palpation, normal fremitus, symmetrical and full respiratory excursions Cardiovascular: normal position and quality of the apical impulse, regular rhythm, normal first and second heart sounds, no murmurs, rubs or gallops Abdomen: no tenderness or distention, no masses by palpation, no abnormal pulsatility or arterial bruits, normal bowel sounds, no hepatosplenomegaly Extremities: no clubbing, cyanosis or edema; 2+ radial, ulnar and brachial pulses bilaterally; 2+ right femoral, posterior tibial and dorsalis pedis pulses; 2+ left femoral, posterior tibial and dorsalis pedis pulses; no subclavian or femoral bruits Neurological: grossly  nonfocal Psych: Normal mood and affect     Wt Readings from Last 3 Encounters:  11/15/23 153 lb 9.6 oz (69.7 kg)  05/27/23 151 lb 12.8 oz (68.9 kg)  12/12/22 156 lb 12.8 oz (71.1 kg)    Studies/Labs Reviewed:   ECG stress test 01/18/2022:   horizontal ST depression in the inferior leads was noted.   The patient walked for 9 minutes and 43 seconds.  He achieved a peak heart rate of 144 which is 87% predicted maximal heart  rate. The blood pressure response was hypertensive. At peak exercise he had 1 to 2 mm of ST segment depression in the inferior leads.  These ST changes resolved fairly quickly in the recovery phase.   This is interpreted as an abnormal exercise test.  There is evidence of inferior wall ischemia.  I cannot rule out the possibility that these were caused by his hypertensive response to exercise.  Nuclear stress test 02/01/2022:   The study is normal. The study is low risk.   Patient exercised according to the BRUCE protocol for 5:59 achieving 7.0 METs   Target HR was achieved (146bpm, 89% MPHR)   No ST deviation was noted.   LV perfusion is normal.   Left ventricular function is normal. Nuclear stress EF: 69 %. The left ventricular ejection fraction is hyperdynamic (>65%). End diastolic cavity size is normal.  EKG:   EKG Interpretation Date/Time:  Friday November 15 2023 13:14:45 EST Ventricular Rate:  63 PR Interval:  144 QRS Duration:  80 QT Interval:  390 QTC Calculation: 399 R Axis:   29  Text Interpretation: Normal sinus rhythm Normal ECG When compared with ECG of 25-Dec-2017 04:58, No significant change was found Confirmed by Reathel Turi [phone]) on 11/15/2023 2:52:48 PM         Recent Labs: No results found for requested labs within last 365 days.   Lipid Panel    Component Value Date/Time   CHOL 126 04/18/2022 0949   CHOL 147 02/13/2013 0902   TRIG 56 04/18/2022 0949   TRIG 72 02/13/2013 0902   HDL 56 04/18/2022 0949   HDL 43  02/13/2013 0902   CHOLHDL 2.3 04/18/2022 0949   CHOLHDL 3.1 08/17/2016 0937   VLDL 12 08/17/2016 0937   LDLCALC 58 04/18/2022 0949   LDLCALC 90 02/13/2013 0902   2024 Creatinine 0.73, potassium 4.2, glucose 86, hemoglobin A1c 5.1%, AST 17 Cholesterol 190, triglycerides 87, HDL 62, LDL 118   ASSESSMENT:    1. Coronary artery disease involving native coronary artery of native heart without angina pectoris   2. Benign essential HTN   3. Hypercholesterolemia   4. Goiter      PLAN:   1. CAD: Does not have angina/remains completely asymptomatic.  History of stents to LAD and OM in 2019. "false positive" stress ECG response with normal nuclear perfusion study earlier this year. Past presentation was with dyspnea, not angina. 2. HLP: on max dose statin and zetia, LDL was at target <70.  Earlier this year she missed taking atorvastatin for several weeks and her LDL increased to 118.  She is now back on her usual medicines.  Will have repeat labs in February with her PCP.  Target LDL less than 70. 3. HTN: As before her diastolic blood pressure is a little high.  However in June when she was more physically active her blood pressure was 129/83.  Strongly recommended more dedication to physical activity and avoiding sodium rich foods.  Did not make any changes to her medications today. 4. Goiter: Unclear of her thyroid.  I do not have a recent TSH.  Medication Adjustments/Labs and Tests Ordered: Current medicines are reviewed at length with the patient today.  Concerns regarding medicines are outlined above.  Medication changes, Labs and Tests ordered today are listed in the Patient Instructions below. Patient Instructions  Medication Instructions:  No changes *If you need a refill on your cardiac medications before your next appointment, please call your pharmacy*  Follow-Up: At [facility], you and your health needs are our priority.  As part of our continuing mission to provide  you with exceptional heart care, we have created designated Provider Care Teams.  These Care Teams include your primary Cardiologist (physician) and Advanced Practice Providers (APPs -  Physician Assistants and Nurse Practitioners) who all work together to provide you with the care you need, when you need it.  We recommend signing up for the patient portal called "MyChart".  Sign up information is provided on this After Visit Summary.  MyChart is used to connect with patients for Virtual Visits (Telemedicine).  Patients are able to view lab/test results, encounter notes, upcoming appointments, etc.  Non-urgent messages can be sent to your provider as well.   To learn more about what you can do with MyChart, go to [email or web address].    Your next appointment:   1 year(s)  Provider:   Thurmon Fair, MD       Signed, Thurmon Fair, MD  11/15/2023 8:49 PM    [facility] [address] [place], [place], [place]  [address] Phone: [phone]; Fax: [phone]

## 2023-11-16 ENCOUNTER — Other Ambulatory Visit: Payer: Self-pay | Admitting: Cardiovascular Disease

## 2023-11-18 ENCOUNTER — Encounter: Payer: Self-pay | Admitting: Psychiatry

## 2023-11-18 ENCOUNTER — Inpatient Hospital Stay: Payer: 59 | Attending: Psychiatry | Admitting: Psychiatry

## 2023-11-18 VITALS — BP 131/80 | HR 79 | Temp 98.9°F | Resp 16 | Ht 63.0 in | Wt 150.0 lb

## 2023-11-18 DIAGNOSIS — N903 Dysplasia of vulva, unspecified: Secondary | ICD-10-CM

## 2023-11-18 DIAGNOSIS — Z86002 Personal history of in-situ neoplasm of other and unspecified genital organs: Secondary | ICD-10-CM | POA: Insufficient documentation

## 2023-11-18 DIAGNOSIS — Z9079 Acquired absence of other genital organ(s): Secondary | ICD-10-CM | POA: Diagnosis not present

## 2023-11-18 NOTE — Patient Instructions (Signed)
It was a pleasure to see you in clinic today. - Exam today is normal - Return visit planned for 6 months  Thank you very much for allowing me to provide care for you today.  I appreciate your confidence in choosing our Gynecologic Oncology team at [facility].  If you have any questions about your visit today please call our office or send [place] a MyChart message and we will get back to you as soon as possible.

## 2023-11-18 NOTE — Progress Notes (Signed)
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  VIN3: - NED on exam today - Recommend pelvic exam q6 months 2 years then annually. - After 2 years, would be safe to return to routine annual Ob/Gyn care. - Signs/symptoms of recurrence of vulvar dysplasia reviewed.    RTC 6 months.  Clide Cliff, MD Gynecologic Oncology   Medical Decision Making I personally spent  TOTAL 15 minutes face-to-face and non-face-to-face in the care of this patient, which includes all pre, intra, and post visit time on the date of service.   ----------------------- Reason for Visit: Surveillance  Treatment History: Patient presented to her OB/GYN for an annual exam on 10/09/2022.  At that time an area of thickening and nodularity was noted in the posterior vestibular area.  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 She underwent a simple partial vulvectomy on 11/13/22 which noted VIN3 with positive vaginal facing margin.  Interval History: Patient overall doing well.  Denies new vulvar lesions, itching, or bleeding.    Past Medical/Surgical History: Past Medical History:  Diagnosis Date   Anxiety    pt denies this dx   CAD (coronary artery disease)    1/19 PCI/DESx1 to LAD, and DESx2 to OM, normal EF via LV gram   Hyperlipidemia    Hypertension     Past Surgical History:  Procedure Laterality Date   ABDOMINAL HYSTERECTOMY     COLONOSCOPY  2006   [facility]   CORONARY ANGIOPLASTY WITH STENT PLACEMENT  12/24/2017   CORONARY BALLOON ANGIOPLASTY N/A 12/24/2017   Procedure: CORONARY BALLOON ANGIOPLASTY;  Surgeon: Marykay Lex, MD;  Location: [facility] INVASIVE CV LAB;  Service: Cardiovascular;  Laterality: N/A;   CORONARY STENT INTERVENTION N/A 12/24/2017   Procedure: CORONARY STENT  INTERVENTION;  Surgeon: Marykay Lex, MD;  Location: [facility] INVASIVE CV LAB;  Service: Cardiovascular;  Laterality: N/A;   DIAGNOSTIC LAPAROSCOPY     ENDOMETRIAL ABLATION W/ NOVASURE     LAPAROSCOPIC ASSISTED VAGINAL HYSTERECTOMY N/A 07/07/2013   Procedure: LAPAROSCOPIC ASSISTED VAGINAL HYSTERECTOMY;  Surgeon: [name], MD;  Location: [facility];  Service: Gynecology;  Laterality: N/A;   RIGHT/LEFT HEART CATH AND CORONARY ANGIOGRAPHY N/A 12/24/2017   Procedure: RIGHT/LEFT HEART CATH AND CORONARY ANGIOGRAPHY;  Surgeon: Marykay Lex, MD;  Location: [facility] INVASIVE CV LAB;  Service: Cardiovascular;  Laterality: N/A;   TONSILLECTOMY     TUBAL LIGATION     bilateral   VULVECTOMY PARTIAL N/A 11/13/2022   Procedure: VULVECTOMY PARTIAL;  Surgeon: Clide Cliff, MD;  Location: [facility];  Service: Gynecology;  Laterality: N/A;    Family History  Problem Relation Age of Onset   Uterine cancer Mother    Coronary artery disease Father    Hypertension Father    Stroke Father    Heart attack Father    Diabetes Paternal Grandmother    Kidney disease Paternal Grandmother    Heart disease Paternal Uncle    Heart attack Paternal Uncle    Colon cancer Neg Hx    Rectal cancer Neg Hx    Stomach cancer Neg Hx    Breast cancer Neg Hx    Ovarian cancer Neg Hx    Pancreatic cancer Neg Hx    Prostate cancer Neg Hx     Social History   Socioeconomic History  Marital status: Married    Spouse name: Not on file   Number of children: 2   Years of education: Not on file   Highest education level: Not on file  Occupational History   Occupation: Child psychotherapist  Tobacco Use   Smoking status: Never   Smokeless tobacco: Never  Vaping Use   Vaping status: Never Used  Substance and Sexual Activity   Alcohol use: Yes    Alcohol/week: 7.0 - 14.0 standard drinks of alcohol    Types: 7 - 14 Glasses of wine per week    Comment: socially drinks wine.   Drug use: No   Sexual activity: Yes    Birth  control/protection: Other-see comments    Comment: Hysterectomy - LAVH  Other Topics Concern   Not on file  Social History Narrative   Not on file   Social Drivers of Health   Financial Resource Strain: Low Risk  (09/29/2019)   Overall Financial Resource Strain (CARDIA)    Difficulty of Paying Living Expenses: Not hard at all  Food Insecurity: No Food Insecurity (02/06/2018)   Hunger Vital Sign    Worried About Running Out of Food in the Last Year: Never true    Ran Out of Food in the Last Year: Never true  Transportation Needs: No Transportation Needs (02/06/2018)   PRAPARE - Administrator, Civil Service (Medical): No    Lack of Transportation (Non-Medical): No  Physical Activity: Sufficiently Active (02/06/2018)   Exercise Vital Sign    Days of Exercise per Week: 5 days    Minutes of Exercise per Session: 40 min  Stress: Not on file  Social Connections: Unknown (09/29/2019)   Social Connection and Isolation Panel [NHANES]    Frequency of Communication with Friends and Family: Three times a week    Frequency of Social Gatherings with Friends and Family: Twice a week    Attends Religious Services: Patient declined    Database administrator or Organizations: Patient declined    Attends Engineer, structural: Patient declined    Marital Status: Married    Current Medications:  Current Outpatient Medications:    amLODipine (NORVASC) 2.5 MG tablet, TAKE 1 TABLET BY MOUTH EVERY DAY, Disp: 30 tablet, Rfl: 5   aspirin EC 81 MG tablet, Take 1 tablet (81 mg total) by mouth daily., Disp: 90 tablet, Rfl: 3   atorvastatin (LIPITOR) 80 MG tablet, Take 1 tablet (80 mg total) by mouth daily. Please keep scheduled appointment for future refills. Thank you., Disp: 30 tablet, Rfl: 0   carvedilol (COREG) 6.25 MG tablet, Take 1 tablet (6.25 mg total) by mouth 2 (two) times daily. Please keep scheduled appointment for future refills. Thank you., Disp: 60 tablet, Rfl: 0   ezetimibe  (ZETIA) 10 MG tablet, TAKE 1 TABLET BY MOUTH EVERY DAY, Disp: 90 tablet, Rfl: 3   nitroGLYCERIN (NITROSTAT) 0.4 MG SL tablet, Place 1 tablet (0.4 mg total) under the tongue every 5 (five) minutes as needed., Disp: 25 tablet, Rfl: 2  Review of Symptoms: Complete 10-system review is positive for: Hot flashes  Physical Exam: BP 131/80 (BP Location: Left Arm, Patient Position: Sitting)   Pulse 79   Temp 98.9 F (37.2 C) (Oral)   Resp 16   Ht 5\' 3"  (1.6 m)   Wt 150 lb (68 kg)   LMP 04/17/2013   SpO2 100%   BMI 26.57 kg/m  General: Alert, oriented, no acute distress. HEENT: Normocephalic, atraumatic. Neck symmetric  without masses. Sclera anicteric.  Chest: Normal work of breathing. Clear to auscultation bilaterally.   Cardiovascular: Regular rate and rhythm, no murmurs. Abdomen: Soft, nontender.  Normoactive bowel sounds.   Extremities: Grossly normal range of motion.  Warm, well perfused.  No edema bilaterally. Skin: No rashes or lesions noted. Lymphatics: No inguinal adenopathy GU: External genitalia with with well-healed posterior vulva from prior vulvectomy.  No lesions on exam.  Speculum exam with normal vaginal mucosa.  Bimanual exam with smooth cuff, no pelvic masses.  Exam chaperoned by Kimberly [place], CMA    Laboratory & Radiologic Studies: None

## 2023-11-23 ENCOUNTER — Other Ambulatory Visit: Payer: Self-pay | Admitting: Cardiovascular Disease

## 2024-05-15 ENCOUNTER — Other Ambulatory Visit: Payer: Self-pay | Admitting: Cardiovascular Disease

## 2024-05-15 DIAGNOSIS — I25118 Atherosclerotic heart disease of native coronary artery with other forms of angina pectoris: Secondary | ICD-10-CM

## 2024-05-18 ENCOUNTER — Inpatient Hospital Stay: Payer: 59 | Attending: Psychiatry | Admitting: Psychiatry

## 2024-05-18 ENCOUNTER — Encounter: Payer: Self-pay | Admitting: Psychiatry

## 2024-05-18 VITALS — BP 131/73 | HR 68 | Temp 97.8°F | Resp 16 | Ht 63.0 in | Wt 151.0 lb

## 2024-05-18 DIAGNOSIS — Z86002 Personal history of in-situ neoplasm of other and unspecified genital organs: Secondary | ICD-10-CM | POA: Insufficient documentation

## 2024-05-18 DIAGNOSIS — Z9079 Acquired absence of other genital organ(s): Secondary | ICD-10-CM | POA: Diagnosis not present

## 2024-05-18 DIAGNOSIS — N903 Dysplasia of vulva, unspecified: Secondary | ICD-10-CM

## 2024-05-18 NOTE — Patient Instructions (Signed)
It was a pleasure to see you in clinic today. - Normal exam. - Return visit planned for 6 mo  Thank you very much for allowing me to provide care for you today.  I appreciate your confidence in choosing our Gynecologic Oncology team at [facility].  If you have any questions about your visit today please call our office or send [place] a MyChart message and we will get back to you as soon as possible.

## 2024-05-18 NOTE — Progress Notes (Signed)
 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 - After 2 years, would be safe to return to routine annual Ob/Gyn care. - Signs/symptoms of recurrence of vulvar dysplasia reviewed.    RTC 6 months.  Derrel Flies, MD Gynecologic Oncology   Medical Decision Making I personally spent  TOTAL 15 minutes face-to-face and non-face-to-face in the care of this patient, which includes all pre, intra, and post visit time on the date of service.   ----------------------- Reason for Visit: Surveillance  Treatment History: Patient presented to her OB/GYN for an annual exam on 10/09/2022.  At that time an area of thickening and nodularity was noted in the posterior vestibular area.  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 She underwent a simple partial vulvectomy on 11/13/22 which noted VIN3 with positive vaginal facing margin.  Interval History: Patient overall doing well.  Denies new vulvar lesions or bleeding.  Had a little itch but didn't last long. Not noticing it currently.   Past Medical/Surgical History: Past Medical History:  Diagnosis Date   Anxiety    pt denies this dx   CAD (coronary artery disease)    1/19 PCI/DESx1 to LAD, and DESx2 to OM, normal EF via LV gram   Hyperlipidemia    Hypertension     Past Surgical History:  Procedure Laterality Date   ABDOMINAL HYSTERECTOMY     COLONOSCOPY  2006   [facility]   CORONARY ANGIOPLASTY WITH STENT PLACEMENT  12/24/2017   CORONARY BALLOON ANGIOPLASTY N/A 12/24/2017   Procedure: CORONARY BALLOON ANGIOPLASTY;  Surgeon: Arleen Lacer, MD;  Location: [facility] INVASIVE CV LAB;  Service: Cardiovascular;  Laterality:  N/A;   CORONARY STENT INTERVENTION N/A 12/24/2017   Procedure: CORONARY STENT INTERVENTION;  Surgeon: Arleen Lacer, MD;  Location: [facility] INVASIVE CV LAB;  Service: Cardiovascular;  Laterality: N/A;   DIAGNOSTIC LAPAROSCOPY     ENDOMETRIAL ABLATION W/ NOVASURE     LAPAROSCOPIC ASSISTED VAGINAL HYSTERECTOMY N/A 07/07/2013   Procedure: LAPAROSCOPIC ASSISTED VAGINAL HYSTERECTOMY;  Surgeon: Chandler Combs, MD;  Location: [facility];  Service: Gynecology;  Laterality: N/A;   RIGHT/LEFT HEART CATH AND CORONARY ANGIOGRAPHY N/A 12/24/2017   Procedure: RIGHT/LEFT HEART CATH AND CORONARY ANGIOGRAPHY;  Surgeon: Arleen Lacer, MD;  Location: [facility] INVASIVE CV LAB;  Service: Cardiovascular;  Laterality: N/A;   TONSILLECTOMY     TUBAL LIGATION     bilateral   VULVECTOMY PARTIAL N/A 11/13/2022   Procedure: VULVECTOMY PARTIAL;  Surgeon: Derrel Flies, MD;  Location: [facility];  Service: Gynecology;  Laterality: N/A;    Family History  Problem Relation Age of Onset   Uterine cancer Mother    Coronary artery disease Father    Hypertension Father    Stroke Father    Heart attack Father    Diabetes Paternal Grandmother    Kidney disease Paternal Grandmother    Heart disease Paternal Uncle    Heart attack Paternal Uncle    Colon cancer Neg Hx    Rectal cancer Neg Hx    Stomach cancer Neg Hx    Breast cancer Neg Hx    Ovarian cancer Neg Hx    Pancreatic cancer Neg Hx  Prostate cancer Neg Hx     Social History   Socioeconomic History   Marital status: Married    Spouse name: Not on file   Number of children: 2   Years of education: Not on file   Highest education level: Not on file  Occupational History   Occupation: Child psychotherapist  Tobacco Use   Smoking status: Never   Smokeless tobacco: Never  Vaping Use   Vaping status: Never Used  Substance and Sexual Activity   Alcohol use: Yes    Alcohol/week: 7.0 - 14.0 standard drinks of alcohol    Types: 7 - 14 Glasses of wine per week     Comment: socially drinks wine.   Drug use: No   Sexual activity: Yes    Birth control/protection: Other-see comments    Comment: Hysterectomy - LAVH  Other Topics Concern   Not on file  Social History Narrative   Not on file   Social Drivers of Health   Financial Resource Strain: Low Risk  (09/29/2019)   Overall Financial Resource Strain (CARDIA)    Difficulty of Paying Living Expenses: Not hard at all  Food Insecurity: No Food Insecurity (02/06/2018)   Hunger Vital Sign    Worried About Running Out of Food in the Last Year: Never true    Ran Out of Food in the Last Year: Never true  Transportation Needs: No Transportation Needs (02/06/2018)   PRAPARE - Administrator, Civil Service (Medical): No    Lack of Transportation (Non-Medical): No  Physical Activity: Sufficiently Active (02/06/2018)   Exercise Vital Sign    Days of Exercise per Week: 5 days    Minutes of Exercise per Session: 40 min  Stress: Not on file  Social Connections: Unknown (09/29/2019)   Social Connection and Isolation Panel    Frequency of Communication with Friends and Family: Three times a week    Frequency of Social Gatherings with Friends and Family: Twice a week    Attends Religious Services: Patient declined    Database administrator or Organizations: Patient declined    Attends Engineer, structural: Patient declined    Marital Status: Married    Current Medications:  Current Outpatient Medications:    amLODipine  (NORVASC ) 2.5 MG tablet, TAKE 1 TABLET BY MOUTH EVERY DAY, Disp: 30 tablet, Rfl: 5   aspirin  EC 81 MG tablet, Take 1 tablet (81 mg total) by mouth daily., Disp: 90 tablet, Rfl: 3   atorvastatin  (LIPITOR ) 80 MG tablet, TAKE 1 TABLET BY MOUTH DAILY. PLEASE KEEP SCHEDULED APPOINTMENT FOR FUTURE REFILLS. THANK YOU., Disp: 90 tablet, Rfl: 3   carvedilol  (COREG ) 6.25 MG tablet, TAKE 1 TABLET BY MOUTH 2 TIMES DAILY. PLEASE KEEP SCHEDULED APPOINTMENT FOR FUTURE REFILLS THANK YOU,  Disp: 180 tablet, Rfl: 3   ezetimibe  (ZETIA ) 10 MG tablet, TAKE 1 TABLET BY MOUTH EVERY DAY, Disp: 90 tablet, Rfl: 3   nitroGLYCERIN  (NITROSTAT ) 0.4 MG SL tablet, Place 1 tablet (0.4 mg total) under the tongue every 5 (five) minutes as needed., Disp: 25 tablet, Rfl: 2  Review of Symptoms: Complete 10-system review is positive for: bleeding gums when brushing teeth  Physical Exam: BP 131/73 (BP Location: Left Arm, Patient Position: Sitting)   Pulse 68   Temp 97.8 F (36.6 C) (Oral)   Resp 16   Ht 5' 3 (1.6 m)   Wt 151 lb (68.5 kg)   LMP 04/17/2013   SpO2 100%   BMI 26.75 kg/m  General: Alert, oriented, no acute distress. HEENT: Normocephalic, atraumatic. Neck symmetric without masses. Sclera anicteric.  Chest: Normal work of breathing. Clear to auscultation bilaterally.   Cardiovascular: Regular rate and rhythm, no murmurs. Abdomen: Soft, nontender.  Normoactive bowel sounds.   Extremities: Grossly normal range of motion.  Warm, well perfused.  No edema bilaterally. Skin: No rashes or lesions noted. Lymphatics: No inguinal adenopathy GU: External genitalia with with well-healed posterior vulva from prior vulvectomy.  No lesions on exam.  Speculum exam with normal vaginal mucosa.  Bimanual exam with smooth cuff, no pelvic masses.  Exam chaperoned by Kimberly [place], CMA   Laboratory & Radiologic Studies: None

## 2024-06-08 ENCOUNTER — Other Ambulatory Visit: Payer: Self-pay | Admitting: Nurse Practitioner

## 2024-06-08 DIAGNOSIS — G4486 Cervicogenic headache: Secondary | ICD-10-CM

## 2024-06-14 ENCOUNTER — Ambulatory Visit (HOSPITAL_COMMUNITY): Attending: Nurse Practitioner

## 2024-06-14 ENCOUNTER — Ambulatory Visit (HOSPITAL_COMMUNITY)

## 2024-06-14 ENCOUNTER — Encounter (HOSPITAL_COMMUNITY): Payer: Self-pay

## 2024-11-09 ENCOUNTER — Ambulatory Visit: Attending: Cardiovascular Disease | Admitting: Cardiovascular Disease

## 2024-11-09 VITALS — BP 138/78 | HR 72 | Ht 63.0 in | Wt 148.4 lb

## 2024-11-09 DIAGNOSIS — E049 Nontoxic goiter, unspecified: Secondary | ICD-10-CM

## 2024-11-09 DIAGNOSIS — I10 Essential (primary) hypertension: Secondary | ICD-10-CM

## 2024-11-09 DIAGNOSIS — I25118 Atherosclerotic heart disease of native coronary artery with other forms of angina pectoris: Secondary | ICD-10-CM

## 2024-11-09 DIAGNOSIS — Z79899 Other long term (current) drug therapy: Secondary | ICD-10-CM

## 2024-11-09 DIAGNOSIS — E7800 Pure hypercholesterolemia, unspecified: Secondary | ICD-10-CM

## 2024-11-09 DIAGNOSIS — I2510 Atherosclerotic heart disease of native coronary artery without angina pectoris: Secondary | ICD-10-CM

## 2024-11-09 MED ORDER — NITROGLYCERIN 0.4 MG SL SUBL: 0.4000 mg | SUBLINGUAL_TABLET | SUBLINGUAL | 3 refills | Status: DC | PRN

## 2024-11-09 MED ORDER — CARVEDILOL 6.25 MG PO TABS: 6.2500 mg | ORAL_TABLET | Freq: Two times a day (BID) | ORAL | 3 refills | Status: AC

## 2024-11-09 MED ORDER — AMLODIPINE BESYLATE 2.5 MG PO TABS: 2.5000 mg | ORAL_TABLET | Freq: Every day | ORAL | 3 refills | Status: AC

## 2024-11-09 MED ORDER — ATORVASTATIN CALCIUM 80 MG PO TABS: 80.0000 mg | ORAL_TABLET | Freq: Every day | ORAL | 3 refills | Status: AC

## 2024-11-09 MED ORDER — EZETIMIBE 10 MG PO TABS: 10.0000 mg | ORAL_TABLET | Freq: Every day | ORAL | 3 refills | Status: AC

## 2024-11-09 NOTE — Progress Notes (Unsigned)
 Cardiology Office Note    Date:  11/09/2024   ID:  SHAKEERAH GRADEL, DOB 08/06/1966, MRN [id number]  PCP:  Ozell Gaskins, [facility]  Cardiologist:   Jerel Balding, MD   No chief complaint on file.   History of Present Illness:  Nancy [name] is a 58 y.o. female with a strong family history of premature coronary artery disease but without any other coronary risk factors, presenting with complaints of exertional dyspnea, found to have coronary artery disease and received drug-eluting stents to the second oblique marginal and  the LAD artery in January 2019.  In addition had a high-grade stenosis in the ramus intermedius, but this vessel was too small for PCI.  35% stenosis was seen in the RCA.  Right heart catheterization showed essentially normal pressures.  Had a false positivetreadmill ECG test in February, followed by normal findings on a myocardial perfusion study in March 2023.  She is feeling well and she remains physically active, although she is not exercising as much is in the past.  She walks 2-3 times a week because she does not like the cold weather.  She does not have exertional dyspnea, orthopnea, PND, lower extremity edema, palpitations, dizziness, syncope, focal neurological complaints or intermittent claudication.  Her blood pressure has been a little higher than in the past.  In June it was 129/83.  Today was 139/88.  She has been taking her antihypertensive medication and has not skipped the carvedilol .  Her metabolic profile is not as favorable.  For a while she missed taking her atorvastatin  when the prescription ran out and her LDL was 118.  The other lipid parameters were excellent with triglycerides 87 and HDL 62.  She is now back on atorvastatin  as well as ezetimibe .  Echo 2017 showed left ventricular wall thickness and systolic function was normal, but there was evidence of diastolic dysfunction  Her father had heart disease starting in his 50s and has undergone both  stents and bypass surgery. Her mother started having coronary problems in her 58s and had an aborted infarction with percutaneous revascularization.   Has a dry cough with ACE inhibitors.    Conclusion CATH 12/24/2017    Prox LAD lesion is 95% stenosed. A drug-eluting stent was successfully placed using a STENT SYNERGY DES 2.5X28. Post intervention, there is a 0% residual stenosis. _____________ Lida 1st Mrg lesion is 80% stenosed. Ost 1st Mrg to 1st Mrg lesion is 85% stenosed. 2 Overlapping drug-eluting stents were successfully placed using a STENT SYNERGY DES 2.25X16 with a STENT SYNERGY DES 2.25X12 proximally Post intervention, there is a 0% residual stenosis. 1st Mrg lesion is 90% stenosed. Distal wire dissection Balloon angioplasty was performed using a BALLOON SAPPHIRE 2.0X15. Post intervention, there is a 0% residual stenosis. _____________ Lida Hughes lesion is 95% stenosed. Too small for PCI Ost 1st Diag lesion is 50% stenosed. The left ventricular systolic function is normal. The left ventricular ejection fraction is 55-65% by visual estimate. Normal RHC Pressures.   Severe 2 vessel PCI - pLAD 99% & ost-p OM1. -- PCI LAD 1 stent, OM1 2 overlapping DES with PTCA distal to stent 2/2 wire dissection. Essentially normal right heart cath pressures with LVEDP and wedge pressure of 11-15 mmHg.   Plan: She will be monitored overnight for post PCI complications anticipate discharge tomorrow if stable.  DAPT with aspirin  and Plavix  for minimum 1 year. Restart home medications, but have increase Crestor to 40 mg daily. Did not restart Lasix , could potentially  give tomorrow versus the day after discharge.      Past Medical History:  Diagnosis Date   Anxiety    pt denies this dx   CAD (coronary artery disease)    1/19 PCI/DESx1 to LAD, and DESx2 to OM, normal EF via LV gram   Hyperlipidemia    Hypertension     Past Surgical History:  Procedure Laterality Date   ABDOMINAL  HYSTERECTOMY     COLONOSCOPY  2006   [facility]   CORONARY ANGIOPLASTY WITH STENT PLACEMENT  12/24/2017   CORONARY BALLOON ANGIOPLASTY N/A 12/24/2017   Procedure: CORONARY BALLOON ANGIOPLASTY;  Surgeon: Anner Alm ORN, MD;  Location: [facility] INVASIVE CV LAB;  Service: Cardiovascular;  Laterality: N/A;   CORONARY STENT INTERVENTION N/A 12/24/2017   Procedure: CORONARY STENT INTERVENTION;  Surgeon: Anner Alm ORN, MD;  Location: [facility] INVASIVE CV LAB;  Service: Cardiovascular;  Laterality: N/A;   DIAGNOSTIC LAPAROSCOPY     ENDOMETRIAL ABLATION W/ NOVASURE     LAPAROSCOPIC ASSISTED VAGINAL HYSTERECTOMY N/A 07/07/2013   Procedure: LAPAROSCOPIC ASSISTED VAGINAL HYSTERECTOMY;  Surgeon: Norleen GORMAN Skill, MD;  Location: [facility] ORS;  Service: Gynecology;  Laterality: N/A;   RIGHT/LEFT HEART CATH AND CORONARY ANGIOGRAPHY N/A 12/24/2017   Procedure: RIGHT/LEFT HEART CATH AND CORONARY ANGIOGRAPHY;  Surgeon: Anner Alm ORN, MD;  Location: [facility] INVASIVE CV LAB;  Service: Cardiovascular;  Laterality: N/A;   TONSILLECTOMY     TUBAL LIGATION     bilateral   VULVECTOMY PARTIAL N/A 11/13/2022   Procedure: VULVECTOMY PARTIAL;  Surgeon: Eldonna Mays, MD;  Location: [facility] ORS;  Service: Gynecology;  Laterality: N/A;    Current Medications: Outpatient Medications Prior to Visit  Medication Sig Dispense Refill   amLODipine  (NORVASC ) 2.5 MG tablet TAKE 1 TABLET BY MOUTH EVERY DAY 30 tablet 5   aspirin  EC 81 MG tablet Take 1 tablet (81 mg total) by mouth daily. 90 tablet 3   atorvastatin  (LIPITOR ) 80 MG tablet TAKE 1 TABLET BY MOUTH DAILY. PLEASE KEEP SCHEDULED APPOINTMENT FOR FUTURE REFILLS. THANK YOU. 90 tablet 3   carvedilol  (COREG ) 6.25 MG tablet TAKE 1 TABLET BY MOUTH 2 TIMES DAILY. PLEASE KEEP SCHEDULED APPOINTMENT FOR FUTURE REFILLS THANK YOU 180 tablet 3   ezetimibe  (ZETIA ) 10 MG tablet TAKE 1 TABLET BY MOUTH EVERY DAY 90 tablet 3   nitroGLYCERIN  (NITROSTAT ) 0.4 MG SL tablet Place 1 tablet (0.4 mg total) under the  tongue every 5 (five) minutes as needed. 25 tablet 2   No facility-administered medications prior to visit.     Allergies:   Patient has no known allergies.   Social History   Socioeconomic History   Marital status: Married    Spouse name: Not on file   Number of children: 2   Years of education: Not on file   Highest education level: Not on file  Occupational History   Occupation: Child Psychotherapist  Tobacco Use   Smoking status: Never   Smokeless tobacco: Never  Vaping Use   Vaping status: Never Used  Substance and Sexual Activity   Alcohol use: Yes    Alcohol/week: 7.0 - 14.0 standard drinks of alcohol    Types: 7 - 14 Glasses of wine per week    Comment: socially drinks wine.   Drug use: No   Sexual activity: Yes    Birth control/protection: Other-see comments    Comment: Hysterectomy - LAVH  Other Topics Concern   Not on file  Social History Narrative   Not on file  Social Drivers of Corporate Investment Banker Strain: Low Risk  (09/29/2019)   Overall Financial Resource Strain (CARDIA)    Difficulty of Paying Living Expenses: Not hard at all  Food Insecurity: No Food Insecurity (02/06/2018)   Hunger Vital Sign    Worried About Running Out of Food in the Last Year: Never true    Ran Out of Food in the Last Year: Never true  Transportation Needs: No Transportation Needs (02/06/2018)   PRAPARE - Administrator, Civil Service (Medical): No    Lack of Transportation (Non-Medical): No  Physical Activity: Sufficiently Active (02/06/2018)   Exercise Vital Sign    Days of Exercise per Week: 5 days    Minutes of Exercise per Session: 40 min  Stress: Not on file  Social Connections: Unknown (09/29/2019)   Social Connection and Isolation Panel    Frequency of Communication with Friends and Family: Three times a week    Frequency of Social Gatherings with Friends and Family: Twice a week    Attends Religious Services: Patient declined    Database Administrator or  Organizations: Patient declined    Attends Engineer, Structural: Patient declined    Marital Status: Married     Family History:  The patient's family history includes Coronary artery disease in her father; Diabetes in her paternal grandmother; Heart attack in her father and paternal uncle; Heart disease in her paternal uncle; Hypertension in her father; Kidney disease in her paternal grandmother; Stroke in her father; Uterine cancer in her mother.   ROS:   Please see the history of present illness.    All other systems are reviewed and are negative.   PHYSICAL EXAM:   VS:  BP 138/78 (BP Location: Left Arm, Patient Position: Sitting, Cuff Size: Normal)   Pulse 72   Ht 5' 3 (1.6 m)   Wt 148 lb 6.4 oz (67.3 kg)   LMP 04/17/2013   SpO2 98%   BMI 26.29 kg/m      General: Alert, oriented x3, no distress, appears fit.  At most is borderline overweight. Head: no evidence of trauma, PERRL, EOMI, no exophtalmos or lid lag, no myxedema, no xanthelasma; normal ears, nose and oropharynx Neck: normal jugular venous pulsations and no hepatojugular reflux; brisk carotid pulses without delay and no carotid bruits Chest: clear to auscultation, no signs of consolidation by percussion or palpation, normal fremitus, symmetrical and full respiratory excursions Cardiovascular: normal position and quality of the apical impulse, regular rhythm, normal first and second heart sounds, no murmurs, rubs or gallops Abdomen: no tenderness or distention, no masses by palpation, no abnormal pulsatility or arterial bruits, normal bowel sounds, no hepatosplenomegaly Extremities: no clubbing, cyanosis or edema; 2+ radial, ulnar and brachial pulses bilaterally; 2+ right femoral, posterior tibial and dorsalis pedis pulses; 2+ left femoral, posterior tibial and dorsalis pedis pulses; no subclavian or femoral bruits Neurological: grossly nonfocal Psych: Normal mood and affect     Wt Readings from Last 3  Encounters:  11/09/24 148 lb 6.4 oz (67.3 kg)  05/18/24 151 lb (68.5 kg)  11/18/23 150 lb (68 kg)    Studies/Labs Reviewed:   ECG stress test 01/18/2022:   horizontal ST depression in the inferior leads was noted.   The patient walked for 9 minutes and 43 seconds.  He achieved a peak heart rate of 144 which is 87% predicted maximal heart rate. The blood pressure response was hypertensive. At peak exercise he had 1 to  2 mm of ST segment depression in the inferior leads.  These ST changes resolved fairly quickly in the recovery phase.   This is interpreted as an abnormal exercise test.  There is evidence of inferior wall ischemia.  I cannot rule out the possibility that these were caused by his hypertensive response to exercise.  Nuclear stress test 02/01/2022:   The study is normal. The study is low risk.   Patient exercised according to the BRUCE protocol for 5:59 achieving 7.0 METs   Target HR was achieved (146bpm, 89% MPHR)   No ST deviation was noted.   LV perfusion is normal.   Left ventricular function is normal. Nuclear stress EF: 69 %. The left ventricular ejection fraction is hyperdynamic (>65%). End diastolic cavity size is normal.  EKG:   EKG Interpretation Date/Time:  Monday November 09 2024 09:22:28 EST Ventricular Rate:  67 PR Interval:  154 QRS Duration:  86 QT Interval:  380 QTC Calculation: 401 R Axis:   25  Text Interpretation: Normal sinus rhythm Normal ECG When compared with ECG of 15-Nov-2023 13:14, No significant change was found Confirmed by Deyci Gesell ([id number]) on 11/09/2024 9:37:51 AM         Recent Labs: No results found for requested labs within last 365 days.   Lipid Panel    Component Value Date/Time   CHOL 126 04/18/2022 0949   CHOL 147 02/13/2013 0902   TRIG 56 04/18/2022 0949   TRIG 72 02/13/2013 0902   HDL 56 04/18/2022 0949   HDL 43 02/13/2013 0902   CHOLHDL 2.3 04/18/2022 0949   CHOLHDL 3.1 08/17/2016 0937   VLDL 12 08/17/2016  0937   LDLCALC 58 04/18/2022 0949   LDLCALC 90 02/13/2013 0902   2024 Creatinine 0.73, potassium 4.2, glucose 86, hemoglobin A1c 5.1%, AST 17 Cholesterol 190, triglycerides 87, HDL 62, LDL 118   ASSESSMENT:    1. Coronary artery disease involving native coronary artery of native heart with other form of angina pectoris   2. Benign essential HTN   3. Hypercholesterolemia   4. Coronary artery disease involving native coronary artery of native heart without angina pectoris      PLAN:   1. CAD: Does not have angina/remains completely asymptomatic.  History of stents to LAD and OM in 2019. false positive stress ECG response with normal nuclear perfusion study earlier this year. Past presentation was with dyspnea, not angina. 2. HLP: on max dose statin and zetia , LDL was at target <70.  Earlier this year she missed taking atorvastatin  for several weeks and her LDL increased to 118.  She is now back on her usual medicines.  Will have repeat labs in February with her PCP.  Target LDL less than 70. 3. HTN: As before her diastolic blood pressure is a little high.  However in June when she was more physically active her blood pressure was 129/83.  Strongly recommended more dedication to physical activity and avoiding sodium rich foods.  Did not make any changes to her medications today. 4. Goiter: Unclear of her thyroid .  I do not have a recent TSH.  Medication Adjustments/Labs and Tests Ordered: Current medicines are reviewed at length with the patient today.  Concerns regarding medicines are outlined above.  Medication changes, Labs and Tests ordered today are listed in the Patient Instructions below. There are no Patient Instructions on file for this visit.   Signed, Jerel Balding, MD  11/09/2024 9:38 AM    [facility]  [address], [place], [place]  [address] Phone: [phone]; Fax: [phone]

## 2024-11-09 NOTE — Patient Instructions (Signed)
 Medication Instructions:  No changes *If you need a refill on your cardiac medications before your next appointment, please call your pharmacy*  Lab Work: Lipid panel, CMP, TSH If you have labs (blood work) drawn today and your tests are completely normal, you will receive your results only by: MyChart Message (if you have MyChart) OR A paper copy in the mail If you have any lab test that is abnormal or we need to change your treatment, we will call you to review the results.  Testing/Procedures: None ordered  Follow-Up: At [facility], you and your health needs are our priority.  As part of our continuing mission to provide you with exceptional heart care, our providers are all part of one team.  This team includes your primary Cardiologist (physician) and Advanced Practice Providers or APPs (Physician Assistants and Nurse Practitioners) who all work together to provide you with the care you need, when you need it.  Your next appointment:   1 year(s)  Provider:   Jerel Balding, MD    We recommend signing up for the patient portal called MyChart.  Sign up information is provided on this After Visit Summary.  MyChart is used to connect with patients for Virtual Visits (Telemedicine).  Patients are able to view lab/test results, encounter notes, upcoming appointments, etc.  Non-urgent messages can be sent to your provider as well.   To learn more about what you can do with MyChart, go to [email or web address].

## 2024-11-10 ENCOUNTER — Ambulatory Visit: Payer: Self-pay | Admitting: Cardiovascular Disease

## 2024-11-10 LAB — COMPREHENSIVE METABOLIC PANEL WITH GFR
ALT: 24 IU/L (ref 0–32)
AST: 22 IU/L (ref 0–40)
Albumin: 4.4 g/dL (ref 3.8–4.9)
Alkaline Phosphatase: 79 IU/L (ref 49–135)
BUN/Creatinine Ratio: 14 (ref 9–23)
BUN: 10 mg/dL (ref 6–24)
Bilirubin Total: 1.1 mg/dL (ref 0.0–1.2)
CO2: 22 mmol/L (ref 20–29)
Calcium: 10.2 mg/dL (ref 8.7–10.2)
Chloride: 103 mmol/L (ref 96–106)
Creatinine, Ser: 0.69 mg/dL (ref 0.57–1.00)
Globulin, Total: 3.1 g/dL (ref 1.5–4.5)
Glucose: 92 mg/dL (ref 70–99)
Potassium: 4.3 mmol/L (ref 3.5–5.2)
Sodium: 141 mmol/L (ref 134–144)
Total Protein: 7.5 g/dL (ref 6.0–8.5)
eGFR: 101 mL/min/1.73 (ref >59)

## 2024-11-10 LAB — LIPID PANEL
Chol/HDL Ratio: 2.4 ratio (ref 0.0–4.4)
Cholesterol, Total: 115 mg/dL (ref 100–199)
HDL: 47 mg/dL (ref >39)
LDL Chol Calc (NIH): 53 mg/dL (ref 0–99)
Triglycerides: 75 mg/dL (ref 0–149)
VLDL Cholesterol Cal: 15 mg/dL (ref 5–40)

## 2024-11-10 LAB — TSH: TSH: 1.87 u[IU]/mL (ref 0.450–4.500)

## 2024-11-15 ENCOUNTER — Encounter: Payer: Self-pay | Admitting: Cardiovascular Disease

## 2024-11-15 MED ORDER — NITROGLYCERIN 0.4 MG SL SUBL: 0.4000 mg | SUBLINGUAL_TABLET | SUBLINGUAL | 3 refills | Status: AC | PRN

## 2024-11-16 ENCOUNTER — Inpatient Hospital Stay: Attending: Psychiatry | Admitting: Psychiatry

## 2024-11-16 ENCOUNTER — Encounter: Payer: Self-pay | Admitting: Psychiatry

## 2024-11-16 VITALS — BP 135/74 | HR 83 | Temp 98.5°F | Resp 19 | Wt 149.0 lb

## 2024-11-16 DIAGNOSIS — Z86002 Personal history of in-situ neoplasm of other and unspecified genital organs: Secondary | ICD-10-CM | POA: Diagnosis not present

## 2024-11-16 DIAGNOSIS — N903 Dysplasia of vulva, unspecified: Secondary | ICD-10-CM

## 2024-11-16 NOTE — Progress Notes (Signed)
 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 - Signs/symptoms of recurrence of vulvar dysplasia reviewed.    RTC prn  Hoy Masters, MD Gynecologic Oncology   Medical Decision Making I personally spent  TOTAL 12 minutes face-to-face and non-face-to-face in the care of this patient, which includes all pre, intra, and post visit time on the date of service.   ----------------------- Reason for Visit: Surveillance  Treatment History: Patient presented to her OB/GYN for an annual exam on 10/09/2022.  At that time an area of thickening and nodularity was noted in the posterior vestibular area.  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 She underwent a simple partial vulvectomy on 11/13/22 which noted VIN3 with positive vaginal facing margin.  Interval History: Patient overall doing well.  Denies new vulvar lesions, itching or bleeding. No concerns.   Past Medical/Surgical History: Past Medical History:  Diagnosis Date   Anxiety    pt denies this dx   CAD (coronary artery disease)    1/19 PCI/DESx1 to LAD, and DESx2 to OM, normal EF via LV gram   Hyperlipidemia    Hypertension     Past Surgical History:  Procedure Laterality Date   ABDOMINAL HYSTERECTOMY     COLONOSCOPY  2006   [facility]   CORONARY ANGIOPLASTY WITH STENT PLACEMENT  12/24/2017   CORONARY BALLOON ANGIOPLASTY N/A 12/24/2017   Procedure: CORONARY BALLOON ANGIOPLASTY;  Surgeon: Anner Alm ORN, MD;  Location: [facility] INVASIVE CV LAB;  Service: Cardiovascular;  Laterality: N/A;   CORONARY STENT INTERVENTION N/A 12/24/2017   Procedure: CORONARY STENT INTERVENTION;  Surgeon: Anner Alm ORN, MD;  Location: [facility] INVASIVE CV LAB;  Service: Cardiovascular;  Laterality: N/A;   DIAGNOSTIC LAPAROSCOPY     ENDOMETRIAL ABLATION W/ NOVASURE     LAPAROSCOPIC ASSISTED VAGINAL HYSTERECTOMY N/A 07/07/2013   Procedure: LAPAROSCOPIC ASSISTED VAGINAL HYSTERECTOMY;  Surgeon: Norleen GORMAN Skill, MD;  Location: [facility] ORS;  Service: Gynecology;  Laterality: N/A;   RIGHT/LEFT HEART CATH AND CORONARY ANGIOGRAPHY N/A 12/24/2017   Procedure: RIGHT/LEFT HEART CATH AND CORONARY ANGIOGRAPHY;  Surgeon: Anner Alm ORN, MD;  Location: [facility] INVASIVE CV LAB;  Service: Cardiovascular;  Laterality: N/A;   TONSILLECTOMY     TUBAL LIGATION     bilateral   VULVECTOMY PARTIAL N/A 11/13/2022   Procedure: VULVECTOMY PARTIAL;  Surgeon: Masters Hoy, MD;  Location: [facility] ORS;  Service: Gynecology;  Laterality: N/A;    Family History  Problem Relation Age of Onset   Uterine cancer Mother    Coronary artery disease Father    Hypertension Father    Stroke Father    Heart attack Father    Diabetes Paternal Grandmother    Kidney disease Paternal Grandmother    Heart disease Paternal Uncle    Heart attack Paternal Uncle    Colon cancer Neg Hx    Rectal cancer Neg Hx    Stomach cancer Neg Hx    Breast cancer Neg Hx    Ovarian cancer Neg Hx    Pancreatic cancer Neg Hx    Prostate cancer Neg Hx     Social History   Socioeconomic History   Marital status: Married  Spouse name: Not on file   Number of children: 2   Years of education: Not on file   Highest education level: Not on file  Occupational History   Occupation: Child Psychotherapist  Tobacco Use   Smoking status: Never   Smokeless tobacco: Never  Vaping Use   Vaping status: Never Used  Substance and Sexual Activity   Alcohol use: Yes    Alcohol/week: 7.0 - 14.0 standard drinks of alcohol    Types: 7 - 14 Glasses of wine per week    Comment: socially drinks wine.   Drug use: No   Sexual activity: Yes    Birth control/protection: Other-see comments     Comment: Hysterectomy - LAVH  Other Topics Concern   Not on file  Social History Narrative   Not on file   Social Drivers of Health   Tobacco Use: Low Risk (11/15/2024)   Patient History    Smoking Tobacco Use: Never    Smokeless Tobacco Use: Never    Passive Exposure: Not on file  Financial Resource Strain: Not on file  Food Insecurity: Not on file  Transportation Needs: Not on file  Physical Activity: Not on file  Stress: Not on file  Social Connections: Not on file  Depression (PHQ2-9): Low Risk (03/21/2022)   Depression (PHQ2-9)    PHQ-2 Score: 0  Alcohol Screen: Not on file  Housing: Not on file  Utilities: Not on file  Health Literacy: Not on file    Current Medications:  Current Outpatient Medications:    amLODipine  (NORVASC ) 2.5 MG tablet, Take 1 tablet (2.5 mg total) by mouth daily., Disp: 90 tablet, Rfl: 3   aspirin  EC 81 MG tablet, Take 1 tablet (81 mg total) by mouth daily., Disp: 90 tablet, Rfl: 3   atorvastatin  (LIPITOR ) 80 MG tablet, Take 1 tablet (80 mg total) by mouth daily., Disp: 90 tablet, Rfl: 3   carvedilol  (COREG ) 6.25 MG tablet, Take 1 tablet (6.25 mg total) by mouth 2 (two) times daily with a meal., Disp: 180 tablet, Rfl: 3   ezetimibe  (ZETIA ) 10 MG tablet, Take 1 tablet (10 mg total) by mouth daily., Disp: 90 tablet, Rfl: 3   nitroGLYCERIN  (NITROSTAT ) 0.4 MG SL tablet, Place 1 tablet (0.4 mg total) under the tongue every 5 (five) minutes as needed., Disp: 25 tablet, Rfl: 3  Review of Symptoms: Complete 10-system review is positive for: none  Physical Exam: LMP 04/17/2013  General: Alert, oriented, no acute distress. HEENT: Normocephalic, atraumatic. Neck symmetric without masses. Sclera anicteric.  Chest: Normal work of breathing. Clear to auscultation bilaterally.   Cardiovascular: Regular rate and rhythm, no murmurs. Abdomen: Soft, nontender.  Normoactive bowel sounds.   Extremities: Grossly normal range of motion.  Warm, well perfused.   No edema bilaterally. Skin: No rashes or lesions noted. Lymphatics: No inguinal adenopathy GU: External genitalia with with well-healed posterior vulva from prior vulvectomy.  No lesions on exam.  Speculum exam with normal vaginal mucosa.  Bimanual exam with smooth cuff, no pelvic masses.  Exam chaperoned by Kimberly Jordan, CMA    Laboratory & Radiologic Studies: None

## 2024-11-16 NOTE — Patient Instructions (Signed)
 It was a pleasure to see you in clinic today. - Normal exam today - Safe to resume routine annual care with gynecologist  Thank you very much for allowing me to provide care for you today.  I appreciate your confidence in choosing our Gynecologic Oncology team at [facility].  If you have any questions about your visit today please call our office or send us  a MyChart message and we will get back to you as soon as possible.
